# Patient Record
Sex: Male | Born: 1940 | Race: White | Hispanic: No | Smoking: Former smoker
Health system: Southern US, Community
[De-identification: ages and names within clinical notes are randomized; demographics above are authoritative.]

## PROBLEM LIST (undated history)

## (undated) DIAGNOSIS — Z789 Other specified health status: Secondary | ICD-10-CM

## (undated) DIAGNOSIS — J189 Pneumonia, unspecified organism: Secondary | ICD-10-CM

## (undated) DIAGNOSIS — D696 Thrombocytopenia, unspecified: Secondary | ICD-10-CM

## (undated) DIAGNOSIS — M1990 Unspecified osteoarthritis, unspecified site: Secondary | ICD-10-CM

## (undated) DIAGNOSIS — R7303 Prediabetes: Secondary | ICD-10-CM

## (undated) DIAGNOSIS — E785 Hyperlipidemia, unspecified: Secondary | ICD-10-CM

## (undated) DIAGNOSIS — F419 Anxiety disorder, unspecified: Secondary | ICD-10-CM

## (undated) DIAGNOSIS — T7840XA Allergy, unspecified, initial encounter: Secondary | ICD-10-CM

## (undated) DIAGNOSIS — H269 Unspecified cataract: Secondary | ICD-10-CM

## (undated) DIAGNOSIS — I10 Essential (primary) hypertension: Secondary | ICD-10-CM

## (undated) DIAGNOSIS — R972 Elevated prostate specific antigen [PSA]: Secondary | ICD-10-CM

## (undated) DIAGNOSIS — N400 Enlarged prostate without lower urinary tract symptoms: Secondary | ICD-10-CM

## (undated) DIAGNOSIS — K219 Gastro-esophageal reflux disease without esophagitis: Secondary | ICD-10-CM

## (undated) DIAGNOSIS — G4700 Insomnia, unspecified: Secondary | ICD-10-CM

## (undated) HISTORY — DX: Insomnia, unspecified: G47.00

## (undated) HISTORY — DX: Essential (primary) hypertension: I10

## (undated) HISTORY — DX: Anxiety disorder, unspecified: F41.9

## (undated) HISTORY — DX: Benign prostatic hyperplasia without lower urinary tract symptoms: N40.0

## (undated) HISTORY — PX: TONSILLECTOMY: SUR1361

## (undated) HISTORY — DX: Gastro-esophageal reflux disease without esophagitis: K21.9

## (undated) HISTORY — PX: OTHER SURGICAL HISTORY: SHX169

## (undated) HISTORY — DX: Unspecified cataract: H26.9

## (undated) HISTORY — PX: CARDIAC VALVE REPLACEMENT: SHX585

## (undated) HISTORY — DX: Hyperlipidemia, unspecified: E78.5

## (undated) HISTORY — PX: JOINT REPLACEMENT: SHX530

## (undated) HISTORY — DX: Allergy, unspecified, initial encounter: T78.40XA

## (undated) HISTORY — DX: Elevated prostate specific antigen (PSA): R97.20

## (undated) HISTORY — PX: REFRACTIVE SURGERY: SHX103

## (undated) HISTORY — DX: Thrombocytopenia, unspecified: D69.6

---

## 1985-02-09 HISTORY — PX: LIPOSUCTION: SHX10

## 2003-12-17 ENCOUNTER — Ambulatory Visit: Payer: Self-pay | Admitting: Family Medicine

## 2004-06-24 ENCOUNTER — Ambulatory Visit: Payer: Self-pay | Admitting: Family Medicine

## 2005-02-20 ENCOUNTER — Ambulatory Visit: Payer: Self-pay | Admitting: Family Medicine

## 2005-03-02 ENCOUNTER — Encounter: Payer: Self-pay | Admitting: Family Medicine

## 2005-04-07 ENCOUNTER — Ambulatory Visit: Payer: Self-pay | Admitting: Family Medicine

## 2006-02-10 ENCOUNTER — Ambulatory Visit: Payer: Self-pay | Admitting: Family Medicine

## 2006-02-10 LAB — CONVERTED CEMR LAB
ALT: 41 units/L — ABNORMAL HIGH (ref 0–40)
AST: 26 units/L (ref 0–37)
Albumin: 3.8 g/dL (ref 3.5–5.2)
Alkaline Phosphatase: 68 units/L (ref 39–117)
BUN: 14 mg/dL (ref 6–23)
Basophils Absolute: 0 10*3/uL (ref 0.0–0.1)
Basophils Relative: 0.3 % (ref 0.0–1.0)
CO2: 29 meq/L (ref 19–32)
Calcium: 9 mg/dL (ref 8.4–10.5)
Chloride: 104 meq/L (ref 96–112)
Chol/HDL Ratio, serum: 4.1
Cholesterol: 206 mg/dL (ref 0–200)
Creatinine, Ser: 1 mg/dL (ref 0.4–1.5)
Eosinophil percent: 2.4 % (ref 0.0–5.0)
GFR calc non Af Amer: 80 mL/min
Glomerular Filtration Rate, Af Am: 97 mL/min/{1.73_m2}
Glucose, Bld: 113 mg/dL — ABNORMAL HIGH (ref 70–99)
HCT: 45.1 % (ref 39.0–52.0)
HDL: 50.3 mg/dL (ref >39.0)
Hemoglobin: 14.6 g/dL (ref 13.0–17.0)
Hgb A1c MFr Bld: 5.3 % (ref 4.6–6.0)
LDL DIRECT: 110.4 mg/dL
Lymphocytes Relative: 30.3 % (ref 12.0–46.0)
MCHC: 32.4 g/dL (ref 30.0–36.0)
MCV: 91 fL (ref 78.0–100.0)
Monocytes Absolute: 0.5 10*3/uL (ref 0.2–0.7)
Monocytes Relative: 9.8 % (ref 3.0–11.0)
Neutro Abs: 2.7 10*3/uL (ref 1.4–7.7)
Neutrophils Relative %: 57.2 % (ref 43.0–77.0)
PSA: 5.94 ng/mL — ABNORMAL HIGH (ref 0.10–4.00)
Platelets: 129 10*3/uL — ABNORMAL LOW (ref 150–400)
Potassium: 3.6 meq/L (ref 3.5–5.1)
RBC: 4.96 M/uL (ref 4.22–5.81)
RDW: 12.9 % (ref 11.5–14.6)
Sodium: 139 meq/L (ref 135–145)
TSH: 4.62 microintl units/mL (ref 0.35–5.50)
Total Bilirubin: 1.1 mg/dL (ref 0.3–1.2)
Total Protein: 6.3 g/dL (ref 6.0–8.3)
Triglyceride fasting, serum: 193 mg/dL — ABNORMAL HIGH (ref 0–149)
VLDL: 39 mg/dL (ref 0–40)
WBC: 4.8 10*3/uL (ref 4.5–10.5)

## 2006-02-22 ENCOUNTER — Ambulatory Visit: Payer: Self-pay | Admitting: Family Medicine

## 2006-03-29 ENCOUNTER — Ambulatory Visit: Payer: Self-pay | Admitting: Family Medicine

## 2006-10-12 DIAGNOSIS — E785 Hyperlipidemia, unspecified: Secondary | ICD-10-CM | POA: Insufficient documentation

## 2006-10-18 ENCOUNTER — Telehealth: Payer: Self-pay | Admitting: Family Medicine

## 2007-01-25 ENCOUNTER — Ambulatory Visit: Payer: Self-pay | Admitting: Family Medicine

## 2007-01-25 LAB — CONVERTED CEMR LAB
Bilirubin Urine: NEGATIVE
Blood in Urine, dipstick: NEGATIVE
Glucose, Urine, Semiquant: NEGATIVE
Ketones, urine, test strip: NEGATIVE
Nitrite: NEGATIVE
Specific Gravity, Urine: 1.02
pH: 5.5

## 2007-01-26 DIAGNOSIS — R972 Elevated prostate specific antigen [PSA]: Secondary | ICD-10-CM | POA: Insufficient documentation

## 2007-01-26 LAB — CONVERTED CEMR LAB
ALT: 47 units/L (ref 0–53)
Alkaline Phosphatase: 68 units/L (ref 39–117)
BUN: 12 mg/dL (ref 6–23)
Basophils Relative: 0.1 % (ref 0.0–1.0)
Bilirubin, Direct: 0.2 mg/dL (ref 0.0–0.3)
CO2: 25 meq/L (ref 19–32)
Calcium: 9.3 mg/dL (ref 8.4–10.5)
Cholesterol: 194 mg/dL (ref 0–200)
Direct LDL: 118.2 mg/dL
Eosinophils Absolute: 0.1 10*3/uL (ref 0.0–0.6)
GFR calc Af Amer: 96 mL/min
GFR calc non Af Amer: 80 mL/min
HDL: 42 mg/dL (ref >39.0)
Hemoglobin: 15.4 g/dL (ref 13.0–17.0)
Lymphocytes Relative: 36.2 % (ref 12.0–46.0)
MCHC: 35.4 g/dL (ref 30.0–36.0)
MCV: 92.1 fL (ref 78.0–100.0)
Monocytes Absolute: 0.5 10*3/uL (ref 0.2–0.7)
Monocytes Relative: 11.6 % — ABNORMAL HIGH (ref 3.0–11.0)
Neutro Abs: 2.3 10*3/uL (ref 1.4–7.7)
Platelets: 130 10*3/uL — ABNORMAL LOW (ref 150–400)
Potassium: 4.2 meq/L (ref 3.5–5.1)
TSH: 4.21 microintl units/mL (ref 0.35–5.50)
Total Protein: 6.5 g/dL (ref 6.0–8.3)
Triglycerides: 227 mg/dL (ref 0–149)

## 2007-02-09 ENCOUNTER — Encounter: Payer: Self-pay | Admitting: Family Medicine

## 2007-02-16 ENCOUNTER — Ambulatory Visit: Payer: Self-pay | Admitting: Family Medicine

## 2007-02-16 DIAGNOSIS — N400 Enlarged prostate without lower urinary tract symptoms: Secondary | ICD-10-CM

## 2007-02-16 DIAGNOSIS — F411 Generalized anxiety disorder: Secondary | ICD-10-CM | POA: Insufficient documentation

## 2007-02-16 DIAGNOSIS — N138 Other obstructive and reflux uropathy: Secondary | ICD-10-CM | POA: Insufficient documentation

## 2007-08-15 ENCOUNTER — Ambulatory Visit: Payer: Self-pay | Admitting: Family Medicine

## 2007-08-15 DIAGNOSIS — K219 Gastro-esophageal reflux disease without esophagitis: Secondary | ICD-10-CM | POA: Insufficient documentation

## 2007-09-19 ENCOUNTER — Telehealth: Payer: Self-pay | Admitting: Family Medicine

## 2008-01-31 ENCOUNTER — Ambulatory Visit: Payer: Self-pay | Admitting: Family Medicine

## 2008-01-31 LAB — CONVERTED CEMR LAB
Glucose, Urine, Semiquant: NEGATIVE
Ketones, urine, test strip: NEGATIVE
Nitrite: NEGATIVE
Specific Gravity, Urine: 1.02
WBC Urine, dipstick: NEGATIVE
pH: 6

## 2008-02-01 LAB — CONVERTED CEMR LAB
Albumin: 3.8 g/dL (ref 3.5–5.2)
Alkaline Phosphatase: 60 units/L (ref 39–117)
BUN: 12 mg/dL (ref 6–23)
Basophils Relative: 0.5 % (ref 0.0–3.0)
Calcium: 9 mg/dL (ref 8.4–10.5)
Creatinine, Ser: 1 mg/dL (ref 0.4–1.5)
Eosinophils Relative: 2.3 % (ref 0.0–5.0)
GFR calc Af Amer: 96 mL/min
Glucose, Bld: 105 mg/dL — ABNORMAL HIGH (ref 70–99)
HCT: 42.5 % (ref 39.0–52.0)
Hemoglobin: 14.7 g/dL (ref 13.0–17.0)
MCV: 91.1 fL (ref 78.0–100.0)
Monocytes Absolute: 0.3 10*3/uL (ref 0.1–1.0)
Monocytes Relative: 8.3 % (ref 3.0–12.0)
Neutro Abs: 2.3 10*3/uL (ref 1.4–7.7)
PSA: 9.46 ng/mL — ABNORMAL HIGH (ref 0.10–4.00)
Potassium: 4.4 meq/L (ref 3.5–5.1)
RBC: 4.67 M/uL (ref 4.22–5.81)
Total CHOL/HDL Ratio: 3.9
Total Protein: 6.3 g/dL (ref 6.0–8.3)
WBC: 3.9 10*3/uL — ABNORMAL LOW (ref 4.5–10.5)

## 2008-02-07 ENCOUNTER — Ambulatory Visit: Payer: Self-pay | Admitting: Family Medicine

## 2008-02-21 ENCOUNTER — Encounter: Payer: Self-pay | Admitting: Family Medicine

## 2008-02-22 ENCOUNTER — Telehealth: Payer: Self-pay | Admitting: Family Medicine

## 2008-02-24 ENCOUNTER — Telehealth: Payer: Self-pay | Admitting: Family Medicine

## 2008-04-24 ENCOUNTER — Ambulatory Visit: Payer: Self-pay | Admitting: Family Medicine

## 2008-05-28 ENCOUNTER — Encounter: Payer: Self-pay | Admitting: Family Medicine

## 2008-06-08 ENCOUNTER — Encounter: Payer: Self-pay | Admitting: Family Medicine

## 2008-07-10 ENCOUNTER — Telehealth: Payer: Self-pay | Admitting: Family Medicine

## 2008-07-11 ENCOUNTER — Telehealth: Payer: Self-pay | Admitting: Family Medicine

## 2008-08-17 ENCOUNTER — Ambulatory Visit: Payer: Self-pay | Admitting: Internal Medicine

## 2008-08-17 DIAGNOSIS — R5383 Other fatigue: Secondary | ICD-10-CM

## 2008-08-17 DIAGNOSIS — R5381 Other malaise: Secondary | ICD-10-CM | POA: Insufficient documentation

## 2008-08-17 LAB — CONVERTED CEMR LAB
Glucose, Urine, Semiquant: NEGATIVE
Protein, U semiquant: NEGATIVE
WBC Urine, dipstick: NEGATIVE
pH: 7

## 2008-08-20 LAB — CONVERTED CEMR LAB
Albumin: 4.2 g/dL (ref 3.5–5.2)
Basophils Absolute: 0 10*3/uL (ref 0.0–0.1)
Bilirubin, Direct: 0.2 mg/dL (ref 0.0–0.3)
CO2: 27 meq/L (ref 19–32)
Calcium: 9.5 mg/dL (ref 8.4–10.5)
Creatinine, Ser: 1.1 mg/dL (ref 0.4–1.5)
Glucose, Bld: 102 mg/dL — ABNORMAL HIGH (ref 70–99)
HCT: 43.8 % (ref 39.0–52.0)
HDL: 40.7 mg/dL (ref >39.00)
Lymphs Abs: 1.2 10*3/uL (ref 0.7–4.0)
MCHC: 35.1 g/dL (ref 30.0–36.0)
MCV: 91.8 fL (ref 78.0–100.0)
Monocytes Absolute: 0.4 10*3/uL (ref 0.1–1.0)
Platelets: 119 10*3/uL — ABNORMAL LOW (ref 150.0–400.0)
RDW: 12.6 % (ref 11.5–14.6)
Total CK: 124 units/L (ref 7–232)
Total Protein: 6.9 g/dL (ref 6.0–8.3)
Triglycerides: 129 mg/dL (ref 0.0–149.0)

## 2008-09-03 ENCOUNTER — Ambulatory Visit: Payer: Self-pay | Admitting: Family Medicine

## 2008-09-03 DIAGNOSIS — K589 Irritable bowel syndrome without diarrhea: Secondary | ICD-10-CM | POA: Insufficient documentation

## 2009-01-11 ENCOUNTER — Telehealth: Payer: Self-pay | Admitting: Family Medicine

## 2009-02-11 ENCOUNTER — Telehealth: Payer: Self-pay | Admitting: Family Medicine

## 2009-02-18 ENCOUNTER — Telehealth: Payer: Self-pay | Admitting: Family Medicine

## 2009-02-27 ENCOUNTER — Ambulatory Visit: Payer: Self-pay | Admitting: Family Medicine

## 2009-02-27 LAB — CONVERTED CEMR LAB
Blood in Urine, dipstick: NEGATIVE
Glucose, Urine, Semiquant: NEGATIVE
Nitrite: NEGATIVE
Protein, U semiquant: NEGATIVE
WBC Urine, dipstick: NEGATIVE
pH: 5.5

## 2009-03-01 LAB — CONVERTED CEMR LAB
AST: 24 units/L (ref 0–37)
Alkaline Phosphatase: 79 units/L (ref 39–117)
Basophils Absolute: 0 10*3/uL (ref 0.0–0.1)
Bilirubin, Direct: 0.1 mg/dL (ref 0.0–0.3)
Calcium: 9.6 mg/dL (ref 8.4–10.5)
Creatinine, Ser: 1.1 mg/dL (ref 0.4–1.5)
GFR calc non Af Amer: 70.75 mL/min (ref >60)
Glucose, Bld: 112 mg/dL — ABNORMAL HIGH (ref 70–99)
HDL: 37.7 mg/dL — ABNORMAL LOW (ref >39.00)
Hemoglobin: 14.9 g/dL (ref 13.0–17.0)
Lymphocytes Relative: 33.3 % (ref 12.0–46.0)
Monocytes Relative: 9.3 % (ref 3.0–12.0)
Neutro Abs: 2.7 10*3/uL (ref 1.4–7.7)
Neutrophils Relative %: 54.5 % (ref 43.0–77.0)
Platelets: 142 10*3/uL — ABNORMAL LOW (ref 150.0–400.0)
RDW: 12.7 % (ref 11.5–14.6)
Sodium: 140 meq/L (ref 135–145)
Total Bilirubin: 0.9 mg/dL (ref 0.3–1.2)
VLDL: 31 mg/dL (ref 0.0–40.0)

## 2009-03-04 ENCOUNTER — Ambulatory Visit: Payer: Self-pay | Admitting: Family Medicine

## 2009-05-20 ENCOUNTER — Encounter (INDEPENDENT_AMBULATORY_CARE_PROVIDER_SITE_OTHER): Payer: Self-pay | Admitting: *Deleted

## 2009-06-24 ENCOUNTER — Ambulatory Visit: Payer: Self-pay | Admitting: Family Medicine

## 2009-06-24 DIAGNOSIS — N529 Male erectile dysfunction, unspecified: Secondary | ICD-10-CM | POA: Insufficient documentation

## 2009-06-24 DIAGNOSIS — J209 Acute bronchitis, unspecified: Secondary | ICD-10-CM | POA: Insufficient documentation

## 2009-08-13 ENCOUNTER — Telehealth: Payer: Self-pay | Admitting: Family Medicine

## 2009-08-16 ENCOUNTER — Encounter: Payer: Self-pay | Admitting: Family Medicine

## 2009-08-16 DIAGNOSIS — K429 Umbilical hernia without obstruction or gangrene: Secondary | ICD-10-CM | POA: Insufficient documentation

## 2009-09-23 ENCOUNTER — Telehealth: Payer: Self-pay | Admitting: Family Medicine

## 2009-09-23 DIAGNOSIS — M25569 Pain in unspecified knee: Secondary | ICD-10-CM | POA: Insufficient documentation

## 2009-10-07 ENCOUNTER — Encounter: Payer: Self-pay | Admitting: Family Medicine

## 2009-10-11 ENCOUNTER — Encounter: Payer: Self-pay | Admitting: Family Medicine

## 2010-01-07 ENCOUNTER — Telehealth (INDEPENDENT_AMBULATORY_CARE_PROVIDER_SITE_OTHER): Payer: Self-pay | Admitting: *Deleted

## 2010-01-08 ENCOUNTER — Ambulatory Visit: Payer: Self-pay | Admitting: Family Medicine

## 2010-01-08 LAB — CONVERTED CEMR LAB
Bilirubin Urine: NEGATIVE
Glucose, Urine, Semiquant: NEGATIVE
Ketones, urine, test strip: NEGATIVE
Protein, U semiquant: NEGATIVE
Urobilinogen, UA: 0.2
pH: 5

## 2010-01-09 ENCOUNTER — Encounter: Payer: Self-pay | Admitting: Family Medicine

## 2010-01-10 ENCOUNTER — Ambulatory Visit (HOSPITAL_COMMUNITY)
Admission: RE | Admit: 2010-01-10 | Discharge: 2010-01-10 | Payer: Self-pay | Source: Home / Self Care | Admitting: Surgery

## 2010-02-11 ENCOUNTER — Encounter (INDEPENDENT_AMBULATORY_CARE_PROVIDER_SITE_OTHER): Payer: Self-pay | Admitting: *Deleted

## 2010-02-14 ENCOUNTER — Encounter (INDEPENDENT_AMBULATORY_CARE_PROVIDER_SITE_OTHER): Payer: Self-pay | Admitting: *Deleted

## 2010-02-17 ENCOUNTER — Ambulatory Visit
Admission: RE | Admit: 2010-02-17 | Discharge: 2010-02-17 | Payer: Self-pay | Source: Home / Self Care | Attending: Internal Medicine | Admitting: Internal Medicine

## 2010-02-26 ENCOUNTER — Ambulatory Visit
Admission: RE | Admit: 2010-02-26 | Discharge: 2010-02-26 | Payer: Self-pay | Source: Home / Self Care | Attending: Internal Medicine | Admitting: Internal Medicine

## 2010-02-26 ENCOUNTER — Other Ambulatory Visit: Payer: Self-pay | Admitting: Internal Medicine

## 2010-02-26 DIAGNOSIS — Z860100 Personal history of colon polyps, unspecified: Secondary | ICD-10-CM | POA: Insufficient documentation

## 2010-02-28 ENCOUNTER — Other Ambulatory Visit: Payer: Self-pay | Admitting: Family Medicine

## 2010-02-28 ENCOUNTER — Ambulatory Visit
Admission: RE | Admit: 2010-02-28 | Discharge: 2010-02-28 | Payer: Self-pay | Source: Home / Self Care | Attending: Family Medicine | Admitting: Family Medicine

## 2010-02-28 LAB — HEPATIC FUNCTION PANEL
ALT: 35 U/L (ref 0–53)
AST: 25 U/L (ref 0–37)
Albumin: 4.1 g/dL (ref 3.5–5.2)
Alkaline Phosphatase: 70 U/L (ref 39–117)
Bilirubin, Direct: 0.2 mg/dL (ref 0.0–0.3)
Total Bilirubin: 0.8 mg/dL (ref 0.3–1.2)
Total Protein: 6.5 g/dL (ref 6.0–8.3)

## 2010-02-28 LAB — CBC WITH DIFFERENTIAL/PLATELET
Basophils Absolute: 0 10*3/uL (ref 0.0–0.1)
Basophils Relative: 0.4 % (ref 0.0–3.0)
Eosinophils Absolute: 0.2 10*3/uL (ref 0.0–0.7)
Eosinophils Relative: 2.8 % (ref 0.0–5.0)
HCT: 43.6 % (ref 39.0–52.0)
Hemoglobin: 15.2 g/dL (ref 13.0–17.0)
Lymphocytes Relative: 30.7 % (ref 12.0–46.0)
Lymphs Abs: 1.7 10*3/uL (ref 0.7–4.0)
MCHC: 34.8 g/dL (ref 30.0–36.0)
MCV: 91.6 fl (ref 78.0–100.0)
Monocytes Absolute: 0.5 10*3/uL (ref 0.1–1.0)
Monocytes Relative: 8.6 % (ref 3.0–12.0)
Neutro Abs: 3.2 10*3/uL (ref 1.4–7.7)
Neutrophils Relative %: 57.5 % (ref 43.0–77.0)
Platelets: 118 10*3/uL — ABNORMAL LOW (ref 150.0–400.0)
RBC: 4.76 Mil/uL (ref 4.22–5.81)
RDW: 13.8 % (ref 11.5–14.6)
WBC: 5.6 10*3/uL (ref 4.5–10.5)

## 2010-02-28 LAB — CONVERTED CEMR LAB
Blood in Urine, dipstick: NEGATIVE
Nitrite: NEGATIVE
Specific Gravity, Urine: 1.02
Urobilinogen, UA: 0.2
WBC Urine, dipstick: NEGATIVE

## 2010-02-28 LAB — LIPID PANEL
Cholesterol: 180 mg/dL (ref 0–200)
HDL: 48.3 mg/dL (ref >39.00)
LDL Cholesterol: 108 mg/dL — ABNORMAL HIGH (ref 0–99)
Total CHOL/HDL Ratio: 4
Triglycerides: 120 mg/dL (ref 0.0–149.0)
VLDL: 24 mg/dL (ref 0.0–40.0)

## 2010-02-28 LAB — BASIC METABOLIC PANEL
BUN: 14 mg/dL (ref 6–23)
CO2: 27 mEq/L (ref 19–32)
Calcium: 9.3 mg/dL (ref 8.4–10.5)
Chloride: 109 mEq/L (ref 96–112)
Creatinine, Ser: 0.9 mg/dL (ref 0.4–1.5)
GFR: 93.71 mL/min (ref >60.00)
Glucose, Bld: 107 mg/dL — ABNORMAL HIGH (ref 70–99)
Potassium: 4.6 mEq/L (ref 3.5–5.1)
Sodium: 144 mEq/L (ref 135–145)

## 2010-02-28 LAB — PSA: PSA: 8.59 ng/mL — ABNORMAL HIGH (ref 0.10–4.00)

## 2010-02-28 LAB — TSH: TSH: 4.24 u[IU]/mL (ref 0.35–5.50)

## 2010-03-03 ENCOUNTER — Encounter: Payer: Self-pay | Admitting: Family Medicine

## 2010-03-03 ENCOUNTER — Encounter: Payer: Self-pay | Admitting: Internal Medicine

## 2010-03-07 ENCOUNTER — Ambulatory Visit
Admission: RE | Admit: 2010-03-07 | Discharge: 2010-03-07 | Payer: Self-pay | Source: Home / Self Care | Attending: Family Medicine | Admitting: Family Medicine

## 2010-03-07 ENCOUNTER — Other Ambulatory Visit: Payer: Self-pay | Admitting: Family Medicine

## 2010-03-07 ENCOUNTER — Encounter: Payer: Self-pay | Admitting: Family Medicine

## 2010-03-07 LAB — TESTOSTERONE: Testosterone: 1007.06 ng/dL — ABNORMAL HIGH (ref 350.00–890.00)

## 2010-03-11 NOTE — Progress Notes (Signed)
Summary: sinus & throat  Phone Note Call from Patient Call back at Home Phone [phone]   Caller: vm Call For: fry Summary of Call: Requesting Zpak for sinus & throat issue, usually works to [employer]  Get this on biyearly basis. Initial call taken by: Rudy Jew, RN,  February 18, 2009 11:31 AM  Follow-up for Phone Call        call in a [employer]pack Follow-up by: Nelwyn Salisbury MD,  February 18, 2009 4:12 PM  Additional Follow-up for Phone Call Additional follow up Details #1::        Phone Call Completed, Rx Called In Additional Follow-up by: Alfred Levins, CMA,  February 19, 2009 10:48 AM    New/Updated Medications: ZITHROMAX Z-PAK 250 MG  TABS (AZITHROMYCIN) Use as directed Prescriptions: ZITHROMAX Z-PAK 250 MG  TABS (AZITHROMYCIN) Use as directed  #1 x 0   Entered by:   Alfred Levins, CMA   Authorized by:   Nelwyn Salisbury MD   Signed by:   Alfred Levins, CMA on 02/19/2009   Method used:   Electronically to        [facility]  [phone]* (retail)       [address] [place], [place]  [address]       Ph: [phone] or [phone]       Fax: [phone]   RxID:   [id number]

## 2010-03-11 NOTE — Progress Notes (Signed)
Summary: HEMAURIA   Phone Note Call from Patient Call back at Home Phone [phone]   Caller: Patient Call For: Nelwyn Salisbury MD Summary of Call: Pt has been passing blood through urine with no associated pain.   Initial call taken by: Lynann Beaver CMA AAMA,  January 07, 2010 2:20 PM  Follow-up for Phone Call        Appt scheduled tomorrow. Follow-up by: Lynann Beaver CMA AAMA,  January 07, 2010 2:21 PM

## 2010-03-11 NOTE — Progress Notes (Signed)
Summary: refill ambien  Phone Note From Pharmacy   [facility]  [address]  [phone]* Call For: Sylvan Lahm  Summary of Call: refill zolpidem 10mg  1 by mouth at bedtime Initial call taken by: Alfred Levins, CMA,  February 11, 2009 2:26 PM  Follow-up for Phone Call        call in #30 with 5 rf Follow-up by: Nelwyn Salisbury MD,  February 12, 2009 8:33 AM  Additional Follow-up for Phone Call Additional follow up Details #1::        Phone call completed, Pharmacist called, 90 day rx Additional Follow-up by: Alfred Levins, CMA,  February 12, 2009 9:14 AM    Prescriptions: AMBIEN 10 MG TABS (ZOLPIDEM TARTRATE) 2 by mouth at bedtime  #180 x 1   Entered by:   Alfred Levins, CMA   Authorized by:   Nelwyn Salisbury MD   Signed by:   Alfred Levins, CMA on 02/12/2009   Method used:   Telephoned to ...       [facility]  [phone]* (retail)       [address] [place], [place]  [address]       Ph: [phone] or [phone]       Fax: [phone]   RxID:   [id number]

## 2010-03-11 NOTE — Assessment & Plan Note (Signed)
Summary: cpx/cjr   Vital Signs:  Patient profile:   70 year old male Height:      71 inches Weight:      230 pounds BMI:     32.19 Temp:     97.8 degrees F oral Pulse rate:   62 / minute BP sitting:   138 / 82  (left arm) Cuff size:   large  Vitals Entered By: Alfred Levins, CMA (March 04, 2009 2:58 PM) CC: cpx   History of Present Illness: 70 yr old male for cpx. He spent a great deal of time telling me that he has stopped drinking alcohol completely as of about 6 months ago. He describes himself as a former "maintenance drinker" but not an alcoholic. he felt it was bad for his health, however, so he stopped. He has even attended somje AA meetings for educational pruposes. He thinks that his daily drinks of scotch were a way of treating his anxiety, and I agree with him. His Xanax is doing a better job of this with fewer side effects. He has had an umbilical hernia for years but it is getting bigger and starting to give him pain, so he would like to have this fixed. He has been having alterations of BMs between aloose stools and constipation, he gets a lot of gas and bloating. No pain or fever or nasuea. He tried Librarian, academic but it seemed to make him worse. He has never had a colonoscopy but is now agreeing to have one.   Current Medications (verified): 1)  Lipitor 20 Mg Tabs (Atorvastatin Calcium) .Marland Kitchen.. 1 By Mouth Once Daily 2)  Ambien 10 Mg Tabs (Zolpidem Tartrate) .... 2 By Mouth At Bedtime 3)  Omeprazole 40 Mg  Cpdr (Omeprazole) .... Once Daily 4)  Alprazolam 1 Mg Tabs (Alprazolam) .... Three Times A Day As Needed Anxiety  Allergies (verified): No Known Drug Allergies  Past History:  Past Medical History: Reviewed history from 02/07/2008 and no changes required. Hyperlipidemia Benign prostatic hypertrophy with elevated PSA, sees Dr. Retta Diones Anxiety insomnia  Past Surgical History: prostate biopsies in 2003 and again 05-2008 per Dr. Retta Diones, benign  Social  History: Reviewed history from 08/17/2008 and no changes required. Married Former Smoker previous alcohol use, quit 2010 Drug use-no wife  with dx this year of parkinsons  and under care  hard time working   Review of Systems  The patient denies anorexia, fever, weight loss, weight gain, vision loss, decreased hearing, hoarseness, chest pain, syncope, dyspnea on exertion, peripheral edema, prolonged cough, headaches, hemoptysis, abdominal pain, melena, hematochezia, severe indigestion/heartburn, hematuria, incontinence, genital sores, muscle weakness, suspicious skin lesions, transient blindness, difficulty walking, depression, unusual weight change, abnormal bleeding, enlarged lymph nodes, angioedema, breast masses, and testicular masses.    Physical Exam  General:  overweight-appearing.   Head:  Normocephalic and atraumatic without obvious abnormalities. No apparent alopecia or balding. Eyes:  No corneal or conjunctival inflammation noted. EOMI. Perrla. Funduscopic exam benign, without hemorrhages, exudates or papilledema. Vision grossly normal. Ears:  External ear exam shows no significant lesions or deformities.  Otoscopic examination reveals clear canals, tympanic membranes are intact bilaterally without bulging, retraction, inflammation or discharge. Hearing is grossly normal bilaterally. Nose:  External nasal examination shows no deformity or inflammation. Nasal mucosa are pink and moist without lesions or exudates. Mouth:  Oral mucosa and oropharynx without lesions or exudates.  Teeth in good repair. Neck:  No deformities, masses, or tenderness noted. Chest Wall:  No deformities, masses, tenderness or  gynecomastia noted. Lungs:  Normal respiratory effort, chest expands symmetrically. Lungs are clear to auscultation, no crackles or wheezes. Heart:  Normal rate and regular rhythm. S1 and S2 normal without gallop, murmur, click, rub or other extra sounds. 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 Msk:  No deformity or scoliosis noted of thoracic or lumbar spine.   Pulses:  R and L carotid,radial,femoral,dorsalis pedis and posterior tibial pulses are full and equal bilaterally Extremities:  No clubbing, cyanosis, edema, or deformity noted with normal full range of motion of all joints.   Neurologic:  No cranial nerve deficits noted. Station and gait are normal. Plantar reflexes are down-going bilaterally. DTRs are symmetrical throughout. Sensory, motor and coordinative functions appear intact. Skin:  Intact without suspicious lesions or rashes Cervical Nodes:  No lymphadenopathy noted Axillary Nodes:  No palpable lymphadenopathy Inguinal Nodes:  No significant adenopathy Psych:  Cognition and judgment appear intact. Alert and cooperative with normal attention span and concentration. No apparent delusions, illusions, hallucinations   Impression & Recommendations:  Problem # 1:  WELL ADULT EXAM (ICD-V70.0)  Orders: EKG w/ Interpretation (93000) Gastroenterology Referral (GI)  Complete Medication List: 1)  Lipitor 20 Mg Tabs (Atorvastatin calcium) .Marland Kitchen.. 1 by mouth once daily 2)  Ambien 10 Mg Tabs (Zolpidem tartrate) .... 2 by mouth at bedtime 3)  Omeprazole 40 Mg Cpdr (Omeprazole) .... Once daily 4)  Alprazolam 1 Mg Tabs (Alprazolam) .... Three times a day as needed anxiety 5)  Zithromax Z-pak 250 Mg Tabs (Azithromycin) .... As directed  Other Orders: Flu Vaccine 24yrs + [phone]) Administration Flu vaccine - MCR (V7846) Pneumococcal Vaccine (96295) Admin 1st Vaccine (28413) Admin of Any Addtl Vaccine (24401) Surgical Referral (Surgery) Flu Vaccine Consent Questions     Do you have a history of severe allergic reactions to this vaccine? no    Any prior history of allergic reactions to egg and/or gelatin? no    Do you have a sensitivity to the preservative Thimersol?  no    Do you have a past history of Guillan-Barre Syndrome? no    Do you currently have an acute febrile illness? no    Have you ever had a severe reaction to latex? no    Vaccine information given and explained to patient? yes    Are you currently pregnant? no    Lot Number:[id number]   Exp Date:08/08/2009   Site Given  Left Deltoid IMation Flu vaccine - MCR (U2725)  Patient Instructions: 1)  It is important that you exercise reguarly at least 20 minutes 5 times a week. If you develop chest pain, have severe difficulty breathing, or feel very tired, stop exercising immediately and seek medical attention.  2)  You need to lose weight. Consider a lower calorie diet and regular exercise.  3)  Schedule a colonoscopy/ sigmoidoscopy to help detect colon cancer.  4)  Refer to Surgery about the hernia.  5)  Try Miralax daily for IBS.  Prescriptions: LIPITOR 20 MG TABS (ATORVASTATIN CALCIUM) 1 by mouth once daily  #90 x 3   Entered and Authorized by:   Nelwyn Salisbury MD   Signed by:   Nelwyn Salisbury MD on 03/04/2009   Method used:   Print then Give to Patient   RxID:   [id number] OMEPRAZOLE 40 MG  CPDR (OMEPRAZOLE) once daily  #90 x 3   Entered and Authorized by:   Tera Mater  Clent Ridges MD   Signed by:   Nelwyn Salisbury MD on 03/04/2009   Method used:   Print then Give to Patient   RxID:   [id number] KGMWNUUVO Z-PAK 250 MG TABS (AZITHROMYCIN) as directed  #1 x 0   Entered and Authorized by:   Nelwyn Salisbury MD   Signed by:   Nelwyn Salisbury MD on 03/04/2009   Method used:   Print then Give to Patient   RxID:   [id number]    .lbmedflu    Immunizations Administered:  Pneumonia Vaccine:    Vaccine Type: Pneumovax (Medicare)    Site: right deltoid    Mfr: Merck    Dose: 0.5 ml    Route: IM    Given by: Alfred Levins, CMA    Exp. Date: 03/07/2010    Lot #: [id number]

## 2010-03-11 NOTE — Letter (Signed)
Summary: Marcine Matar MD  Marcine Matar MD   Imported By: Sherian Rein 07/20/2009 11:21:21  _____________________________________________________________________  External Attachment:    Type:   Image     Comment:   External Document

## 2010-03-11 NOTE — Letter (Signed)
Summary: [facility]  [facility]   Imported By: Sherian Rein 10/16/2009 11:45:51  _____________________________________________________________________  External Attachment:    Type:   Image     Comment:   External Document

## 2010-03-11 NOTE — Miscellaneous (Signed)
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

## 2010-03-11 NOTE — Progress Notes (Signed)
Summary: RX Zolpidem  Phone Note Refill Request Message from:  Fax from Pharmacy on August 13, 2009 3:27 PM  Refills Requested: Medication #1:  AMBIEN 10 MG TABS 2 by mouth at bedtime   Dosage confirmed as above?Dosage Confirmed   Last Refilled: 06/13/2009 Last ov 06/24/09, cpx was 03/04/09 with lab work done was current on med list   Initial call taken by: Kathrynn Speed CMA,  August 13, 2009 3:27 PM    Prescriptions: AMBIEN 10 MG TABS (ZOLPIDEM TARTRATE) 2 by mouth at bedtime  #180 x 1   Entered by:   Kathrynn Speed CMA   Authorized by:   Nelwyn Salisbury MD   Signed by:   Raechel Ache, RN on 08/13/2009   Method used:   Telephoned to ...       [facility]  [phone]* (retail)       [address] [place], [place]  [address]       Ph: [phone] or [phone]       Fax: [phone]   RxID:   [id number]

## 2010-03-11 NOTE — Assessment & Plan Note (Signed)
Summary: CONGESTION // RS   Vital Signs:  Patient profile:   70 year old male Weight:      227 pounds Temp:     97.6 degrees F oral BP sitting:   132 / 78  (left arm) Cuff size:   regular  Vitals Entered By: Raechel Ache, RN (Jun 24, 2009 1:27 PM) CC: C/o congestion & cough.   History of Present Illness: Here for one week of chest congestion, PND, and coughing up green sputum. No fever. Also he needs refils on Xanax. He is still very pleased with how this works for him, and he remains completely off alcohol. He wants to try Viagra also.   Allergies (verified): No Known Drug Allergies  Past History:  Past Medical History: Reviewed history from 02/07/2008 and no changes required. Hyperlipidemia Benign prostatic hypertrophy with elevated PSA, sees Dr. Retta Diones Anxiety insomnia  Review of Systems  The patient denies anorexia, fever, weight loss, weight gain, vision loss, decreased hearing, hoarseness, chest pain, syncope, dyspnea on exertion, peripheral edema, headaches, hemoptysis, abdominal pain, melena, hematochezia, severe indigestion/heartburn, hematuria, incontinence, genital sores, muscle weakness, suspicious skin lesions, transient blindness, difficulty walking, depression, unusual weight change, abnormal bleeding, enlarged lymph nodes, angioedema, breast masses, and testicular masses.    Physical Exam  General:  Well-developed,well-nourished,in no acute distress; alert,appropriate and cooperative throughout examination Head:  Normocephalic and atraumatic without obvious abnormalities. No apparent alopecia or balding. Eyes:  No corneal or conjunctival inflammation noted. EOMI. Perrla. Funduscopic exam benign, without hemorrhages, exudates or papilledema. Vision grossly normal. Ears:  External ear exam shows no significant lesions or deformities.  Otoscopic examination reveals clear canals, tympanic membranes are intact bilaterally without bulging, retraction,  inflammation or discharge. Hearing is grossly normal bilaterally. Nose:  External nasal examination shows no deformity or inflammation. Nasal mucosa are pink and moist without lesions or exudates. Mouth:  Oral mucosa and oropharynx without lesions or exudates.  Teeth in good repair. Neck:  No deformities, masses, or tenderness noted. Lungs:  Normal respiratory effort, chest expands symmetrically. Lungs are clear to auscultation, no crackles or wheezes. Psych:  Cognition and judgment appear intact. Alert and cooperative with normal attention span and concentration. No apparent delusions, illusions, hallucinations   Impression & Recommendations:  Problem # 1:  ACUTE BRONCHITIS (ICD-466.0)  His updated medication list for this problem includes:    Biaxin Xl 500 Mg Xr24h-tab (Clarithromycin) .Marland Kitchen..Marland Kitchen Two times a day    Augmentin 875-125 Mg Tabs (Amoxicillin-pot clavulanate) .Marland Kitchen..Marland Kitchen Two times a day  Problem # 2:  ERECTILE DYSFUNCTION, ORGANIC (ICD-607.84)  His updated medication list for this problem includes:    Viagra 100 Mg Tabs (Sildenafil citrate) .Marland Kitchen... As needed  Problem # 3:  ANXIETY (ICD-300.00)  His updated medication list for this problem includes:    Alprazolam 1 Mg Tabs (Alprazolam) .Marland Kitchen... Four  times a day as needed anxiety  Complete Medication List: 1)  Lipitor 20 Mg Tabs (Atorvastatin calcium) .Marland Kitchen.. 1 by mouth once daily 2)  Ambien 10 Mg Tabs (Zolpidem tartrate) .... 2 by mouth at bedtime 3)  Omeprazole 40 Mg Cpdr (Omeprazole) .... Once daily 4)  Alprazolam 1 Mg Tabs (Alprazolam) .... Four  times a day as needed anxiety 5)  Biaxin Xl 500 Mg Xr24h-tab (Clarithromycin) .... Two times a day 6)  Augmentin 875-125 Mg Tabs (Amoxicillin-pot clavulanate) .... Two times a day 7)  Viagra 100 Mg Tabs (Sildenafil citrate) .... As needed  Patient Instructions: 1)  Please schedule a follow-up appointment as  needed .  Prescriptions: VIAGRA 100 MG TABS (SILDENAFIL CITRATE) as needed  #10 x  11   Entered and Authorized by:   Nelwyn Salisbury MD   Signed by:   Nelwyn Salisbury MD on 06/24/2009   Method used:   Print then Give to Patient   RxID:   [id number] ALPRAZOLAM 1 MG TABS (ALPRAZOLAM) four  times a day as needed anxiety  #120 x 5   Entered and Authorized by:   Nelwyn Salisbury MD   Signed by:   Nelwyn Salisbury MD on 06/24/2009   Method used:   Print then Give to Patient   RxID:   [id number] AUGMENTIN 875-125 MG TABS (AMOXICILLIN-POT CLAVULANATE) two times a day  #20 x 0   Entered and Authorized by:   Nelwyn Salisbury MD   Signed by:   Nelwyn Salisbury MD on 06/24/2009   Method used:   Print then Give to Patient   RxID:   [id number] BIAXIN XL 500 MG XR24H-TAB (CLARITHROMYCIN) two times a day  #20 x 0   Entered and Authorized by:   Nelwyn Salisbury MD   Signed by:   Nelwyn Salisbury MD on 06/24/2009   Method used:   Print then Give to Patient   RxID:   [id number]

## 2010-03-11 NOTE — Consult Note (Signed)
Summary: [facility]   Imported By: Maryln Gottron 11/01/2009 15:11:42  _____________________________________________________________________  External Attachment:    Type:   Image     Comment:   External Document

## 2010-03-11 NOTE — Assessment & Plan Note (Signed)
Summary: hematuria/dm   Vital Signs:  Patient profile:   70 year old male O2 Sat:      97 % Temp:     98.5 degrees F Pulse rate:   68 / minute BP sitting:   140 / 88  (left arm) Cuff size:   regular  Vitals Entered By: Pura Spice, RN (January 08, 2010 8:48 AM) CC: henmauria since yest no pain refill alprazolam  states no blood seen today    History of Present Illness: Here to ask about blood in the urine, which he first noticed yesterday. This am he did not see any. He denies any burning or urgency, no fever, or any other symptoms. He has a ventral hernia, and he is scheduled to have this repaired by Dr. Wenda Low later this week.   Allergies: No Known Drug Allergies  Past History:  Past Medical History: Reviewed history from 02/07/2008 and no changes required. Hyperlipidemia Benign prostatic hypertrophy with elevated PSA, sees Dr. Retta Diones Anxiety insomnia  Past Surgical History: prostate biopsies in 2003 and again 05-2008 per Dr. Retta Diones, benign right knee arthroscopy  Review of Systems  The patient denies anorexia, fever, weight loss, weight gain, vision loss, decreased hearing, hoarseness, chest pain, syncope, dyspnea on exertion, peripheral edema, prolonged cough, headaches, hemoptysis, abdominal pain, melena, hematochezia, severe indigestion/heartburn, hematuria, incontinence, genital sores, muscle weakness, suspicious skin lesions, transient blindness, difficulty walking, depression, unusual weight change, abnormal bleeding, enlarged lymph nodes, angioedema, breast masses, and testicular masses.    Physical Exam  General:  Well-developed,well-nourished,in no acute distress; alert,appropriate and cooperative throughout examination Abdomen:  Bowel sounds positive,abdomen soft and non-tender without masses, organomegaly or hernias noted. Psych:  Cognition and judgment appear intact. Alert and cooperative with normal attention span and concentration. No apparent  delusions, illusions, hallucinations   Impression & Recommendations:  Problem # 1:  HEMATURIA UNSPECIFIED (ICD-599.70)  His updated medication list for this problem includes:    Ciprofloxacin Hcl 500 Mg Tabs (Ciprofloxacin hcl) .Marland Kitchen..Marland Kitchen Two times a day    Augmentin 875-125 Mg Tabs (Amoxicillin-pot clavulanate) .Marland Kitchen..Marland Kitchen Two times a day  Orders: UA Dipstick w/o Micro (manual) ([address]) T-Culture, Urine ([id number])  Problem # 2:  ANXIETY (ICD-300.00)  His updated medication list for this problem includes:    Alprazolam 1 Mg Tabs (Alprazolam) .Marland Kitchen... Four  times a day as needed anxiety  Complete Medication List: 1)  Lipitor 20 Mg Tabs (Atorvastatin calcium) .Marland Kitchen.. 1 by mouth once daily 2)  Ambien 10 Mg Tabs (Zolpidem tartrate) .... 2 by mouth at bedtime 3)  Omeprazole 40 Mg Cpdr (Omeprazole) .... Once daily 4)  Alprazolam 1 Mg Tabs (Alprazolam) .... Four  times a day as needed anxiety 5)  Viagra 100 Mg Tabs (Sildenafil citrate) .... As needed 6)  Ciprofloxacin Hcl 500 Mg Tabs (Ciprofloxacin hcl) .... Two times a day 7)  Augmentin 875-125 Mg Tabs (Amoxicillin-pot clavulanate) .... Two times a day  Other Orders: Gastroenterology Referral (GI)  Patient Instructions: 1)  Cover for infection and send the sample for culture. Refilled Alprazolam. Prescriptions: ALPRAZOLAM 1 MG TABS (ALPRAZOLAM) four  times a day as needed anxiety  #120 x 5   Entered and Authorized by:   Nelwyn Salisbury MD   Signed by:   Nelwyn Salisbury MD on 01/08/2010   Method used:   Print then Give to Patient   RxID:   [id number] AUGMENTIN 875-125 MG TABS (AMOXICILLIN-POT CLAVULANATE) two times a day  #20 x 0   Entered and  Authorized by:   Nelwyn Salisbury MD   Signed by:   Nelwyn Salisbury MD on 01/08/2010   Method used:   Electronically to        [facility]  [phone]* (retail)       [address] [place], [place]  [address]       Ph: [phone] or [phone]       Fax: [phone]   RxID:    [id number] CIPROFLOXACIN HCL 500 MG TABS (CIPROFLOXACIN HCL) two times a day  #20 x 0   Entered and Authorized by:   Nelwyn Salisbury MD   Signed by:   Nelwyn Salisbury MD on 01/08/2010   Method used:   Electronically to        [facility]  [phone]* (retail)       [address] [place], [place]  [address]       Ph: [phone] or [phone]       Fax: [phone]   RxID:   [id number]    Orders Added: 1)  Est. Patient Level IV [[address]] 2)  UA Dipstick w/o Micro (manual) [[address]1002] 3)  T-Culture, Urine [[id number]] 4)  Gastroenterology Referral [GI]    Laboratory Results   Urine Tests  Date/Time Received: January 08, 2010 8:58 AM Date/Time Reported: 8:58 AM   Routine Urinalysis   Color: yellow Appearance: Clear Glucose: negative   (Normal Range: Negative) Bilirubin: negative   (Normal Range: Negative) Ketone: negative   (Normal Range: Negative) Spec. Gravity: 1.020   (Normal Range: 1.003-1.035) Blood: small   (Normal Range: Negative) pH: 5.0   (Normal Range: 5.0-8.0) Protein: negative   (Normal Range: Negative) Urobilinogen: 0.2   (Normal Range: 0-1) Nitrite: negative   (Normal Range: Negative) Leukocyte Esterace: negative   (Normal Range: Negative)    Comments: Pura Spice, RN  January 08, 2010 8:58 AM

## 2010-03-11 NOTE — Progress Notes (Signed)
Summary: need referral  Phone Note Call from Patient Call back at Home Phone [phone]   Caller: Patient Call For: Nelwyn Salisbury MD Summary of Call: pt would like a referral to see dr Dion Saucier ortho [phone] for right knee pain after aug 24,2011 Initial call taken by: Heron Sabins,  September 23, 2009 10:31 AM  Follow-up for Phone Call        please do so Follow-up by: Nelwyn Salisbury MD,  September 23, 2009 3:31 PM  New Problems: KNEE PAIN, RIGHT (ICD-719.46)   New Problems: KNEE PAIN, RIGHT (ICD-719.46)

## 2010-03-11 NOTE — Letter (Signed)
Summary: Referral - not able to see patient  [facility]  [address] [place], [place] [address]   Phone: [phone]  Fax: [phone]    May 20, 2009   Tera Mater. Clent Ridges, M.D. [address] [place], [place] [address]   Re:   DEMARUS LATTERELL DOB:  1940/09/22 MRN:   [id number]    Dear Dr. Clent Ridges:  Thank you for your kind referral of the above patient.  We have attempted to schedule the recommended procedure Screening Colonoscopy but have not been able to schedule because:   X  The patient was not available by phone and/or has not returned our calls.  ___ The patient declined to schedule the procedure at this time.  We appreciate the referral and hope that we will have the opportunity to treat this patient in the future.    Sincerely,    [facility] [phone]

## 2010-03-13 NOTE — Assessment & Plan Note (Signed)
Summary: Benjamin Cyndia [name]   Vital Signs:  Patient profile:   70 year old male Height:      71 inches Weight:      240 pounds O2 Sat:      96 % Temp:     97.8 degrees F Pulse rate:   70 / minute BP sitting:   140 / 84  (left arm) Cuff size:   large  Vitals Entered By: Pura Spice, RN (March 07, 2010 9:23 AM) CC: Benjamin discuss labs     History of Present Illness: 70 yr old male for a Benjamin. He has severl issues to discuss. First he admits to a poor diet and lack of exercise when we point out his mildly elevated LDL and glucose. Second he would like to try some testosterone replacement again due to lack of energy and some erection problems. He used Androgel a few years ago.   Allergies (verified): No Known Drug Allergies  Past History:  Past Medical History: Reviewed history from 02/07/2008 and no changes required. Hyperlipidemia Benign prostatic hypertrophy with elevated PSA, sees Dr. Retta Diones Anxiety insomnia  Past Surgical History: prostate biopsies in 2003 and again 05-2008 per Dr. Retta Diones, benign right knee arthroscopy repair of large umbilical hernia per Dr. Wenda Low on 01-10-10 colonoscopy 02-28-10 per Dr. Leone Payor, single adenomatous polyp, repeat 5 yrs  Family History: Reviewed history from 02/16/2007 and no changes required. Alzheimer disease  Social History: Reviewed history from 03/04/2009 and no changes required. Married Former Smoker previous alcohol use, quit 2010 Drug use-no wife  with dx this year of parkinsons  and under care  hard time working   Review of Systems  The patient denies anorexia, fever, weight loss, weight gain, vision loss, decreased hearing, hoarseness, chest pain, syncope, dyspnea on exertion, peripheral edema, prolonged cough, headaches, hemoptysis, abdominal pain, melena, hematochezia, severe indigestion/heartburn, hematuria, incontinence, genital sores, muscle weakness, suspicious skin lesions, transient blindness, difficulty walking,  depression, unusual weight change, abnormal bleeding, enlarged lymph nodes, angioedema, breast masses, and testicular masses.    Physical Exam  General:  overweight-appearing.   Head:  Normocephalic and atraumatic without obvious abnormalities. No apparent alopecia or balding. Eyes:  No corneal or conjunctival inflammation noted. EOMI. Perrla. Funduscopic exam benign, without hemorrhages, exudates or papilledema. Vision grossly normal. Ears:  External ear exam shows no significant lesions or deformities.  Otoscopic examination reveals clear canals, tympanic membranes are intact bilaterally without bulging, retraction, inflammation or discharge. Hearing is grossly normal bilaterally. Nose:  External nasal examination shows no deformity or inflammation. Nasal mucosa are pink and moist without lesions or exudates. Mouth:  Oral mucosa and oropharynx without lesions or exudates.  Teeth in good repair. Neck:  No deformities, masses, or tenderness noted. Chest Wall:  No deformities, masses, tenderness or gynecomastia noted. Lungs:  Normal respiratory effort, chest expands symmetrically. Lungs are clear to auscultation, no crackles or wheezes. Heart:  Normal rate and regular rhythm. S1 and S2 normal without gallop, murmur, click, rub or other extra sounds. 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 Msk:  No deformity or scoliosis noted of thoracic or lumbar spine.   Pulses:  R and L carotid,radial,femoral,dorsalis pedis and posterior tibial pulses are full and equal bilaterally Extremities:  No clubbing, cyanosis, edema, or deformity noted with normal full range of motion of all joints.   Neurologic:  No cranial nerve deficits noted. Station and gait are normal.  Plantar reflexes are down-going bilaterally. DTRs are symmetrical throughout. Sensory, motor and coordinative functions appear intact. Skin:   Intact without suspicious lesions or rashes Cervical Nodes:  No lymphadenopathy noted Axillary Nodes:  No palpable lymphadenopathy Inguinal Nodes:  No significant adenopathy Psych:  Cognition and judgment appear intact. Alert and cooperative with normal attention span and concentration. No apparent delusions, illusions, hallucinations   Impression & Recommendations:  Problem # 1:  WELL ADULT EXAM (ICD-V70.0)  Orders: EKG w/ Interpretation (93000)  Complete Medication List: 1)  Lipitor 20 Mg Tabs (Atorvastatin calcium) .Marland Kitchen.. 1 by mouth once daily 2)  Ambien 10 Mg Tabs (Zolpidem tartrate) .... 2 by mouth at bedtime 3)  Omeprazole 40 Mg Cpdr (Omeprazole) .... Once daily 4)  Alprazolam 1 Mg Tabs (Alprazolam) .... Four  times a day as needed anxiety 5)  Viagra 100 Mg Tabs (Sildenafil citrate) .... As needed 6)  Axiron 30 Mg/act Soln (Testosterone) .... Apply under the arms once daily  Other Orders: Venipuncture (45409) TLB-Testosterone, Total (84403-TESTO) Specimen Handling (81191)  Patient Instructions: 1)  Get a testosterone level. Try Axiron.  2)  It is important that you exercise reguarly at least 20 minutes 5 times a week. If you develop chest pain, have severe difficulty breathing, or feel very tired, stop exercising immediately and seek medical attention.  3)  You need to lose weight. Consider a lower calorie diet and regular exercise.  Prescriptions: AXIRON 30 MG/ACT SOLN (TESTOSTERONE) apply under the arms once daily  #90 x 3   Entered and Authorized by:   Nelwyn Salisbury MD   Signed by:   Nelwyn Salisbury MD on 03/07/2010   Method used:   Print then Give to Patient   RxID:   [id number] AMBIEN 10 MG TABS (ZOLPIDEM TARTRATE) 2 by mouth at bedtime  #180 x 1   Entered and Authorized by:   Nelwyn Salisbury MD   Signed by:   Nelwyn Salisbury MD on 03/07/2010   Method used:   Print then Give to Patient   RxID:   [id number] LIPITOR 20 MG TABS (ATORVASTATIN CALCIUM) 1 by mouth  once daily  #90 x 3   Entered and Authorized by:   Nelwyn Salisbury MD   Signed by:   Nelwyn Salisbury MD on 03/07/2010   Method used:   Print then Give to Patient   RxID:   [id number]    Orders Added: 1)  Venipuncture [25956] 2)  TLB-Testosterone, Total [84403-TESTO] 3)  Specimen Handling [99000] 4)  Est. Patient 65& > [99397] 5)  EKG w/ Interpretation [93000]

## 2010-03-13 NOTE — Miscellaneous (Signed)
Summary: [facility] previsit  Clinical Lists Changes  Medications: Added new medication of MOVIPREP 100 GM  SOLR (PEG-KCL-NACL-NASULF-NA ASC-C) As per prep instructions. - Signed Rx of MOVIPREP 100 GM  SOLR (PEG-KCL-NACL-NASULF-NA ASC-C) As per prep instructions.;  #1 x 0;  Signed;  Entered by: Karl Bales RN;  Authorized by: Iva Boop MD, [facility];  Method used: [facility] to [facility]  [phone]*, [address], [place], [place]  [address], Ph: [phone] or [phone], Fax: [phone] Observations: Added new observation of NKA: T (02/17/2010 13:21)    Prescriptions: MOVIPREP 100 GM  SOLR (PEG-KCL-NACL-NASULF-NA ASC-C) As per prep instructions.  #1 x 0   Entered by:   Karl Bales RN   Authorized by:   Iva Boop MD, [facility]   Signed by:   Karl Bales RN on 02/17/2010   Method used:   [facility] to        [facility]  [phone]* (retail)       [address] [place], [place]  [address]       Ph: [phone] or [phone]       Fax: [phone]   RxID:   [id number]

## 2010-03-13 NOTE — Letter (Signed)
Summary: [facility] Instructions  [facility]  [address] [place], [place] [address]   Phone: [phone]  Fax: [phone]       Benjamin [name]    [birth year]    MRN: [id number]        Procedure Day Dorna Bloom:  Wednesday 02-26-10      Arrival Time: 7:30 a.m.     Procedure Time: 8:30 a.m.     Location of Procedure:                    _x _  [facility] (4th Floor)                        PREPARATION FOR COLONOSCOPY WITH MOVIPREP   Starting 5 days prior to your procedure  02-21-10  do not eat nuts, seeds, popcorn, corn, beans, peas,  salads, or any raw vegetables.  Do not take any fiber supplements (e.g. Metamucil, Citrucel, and Benefiber).  THE DAY BEFORE YOUR PROCEDURE         DATE:  02-25-10  DAY:  Tuesday   1.  Drink clear liquids the entire day-NO SOLID FOOD  2.  Do not drink anything colored red or purple.  Avoid juices with pulp.  No orange juice.  3.  Drink at least 64 oz. (8 glasses) of fluid/clear liquids during the day to prevent dehydration and help the prep work efficiently.  CLEAR LIQUIDS INCLUDE: Water Jello Ice Popsicles Tea (sugar ok, no milk/cream) Powdered fruit flavored drinks Coffee (sugar ok, no milk/cream) Gatorade Juice: apple, white grape, white cranberry  Lemonade Clear bullion, consomm, broth Carbonated beverages (any kind) Strained chicken noodle soup Hard Candy                             4.  In the morning, mix first dose of MoviPrep solution:    Empty 1 Pouch A and 1 Pouch B into the disposable container    Add lukewarm drinking water to the top line of the container. Mix to dissolve    Refrigerate (mixed solution should be used within 24 hrs)  5.  Begin drinking the prep at 5:00 p.m. The MoviPrep container is divided by 4 marks.   Every 15 minutes drink the solution down to the next mark (approximately 8 oz) until the full liter is complete.   6.  Follow completed prep with 16 oz of clear liquid of your  choice (Nothing red or purple).  Continue to drink clear liquids until bedtime.  7.  Before going to bed, mix second dose of MoviPrep solution:    Empty 1 Pouch A and 1 Pouch B into the disposable container    Add lukewarm drinking water to the top line of the container. Mix to dissolve    Refrigerate  THE DAY OF YOUR PROCEDURE      DATE:  02-26-10  DAY:  Wednesday  Beginning at  3:30 a.m. (5 hours before procedure):         1. Every 15 minutes, drink the solution down to the next mark (approx 8 oz) until the full liter is complete.  2. Follow completed prep with 16 oz. of clear liquid of your choice.    3. You may drink clear liquids until  6:30 a.m. (2 HOURS BEFORE PROCEDURE).   MEDICATION INSTRUCTIONS  Unless otherwise instructed, you should take regular prescription medications with a small  sip of water   as early as possible the morning of your procedure.           OTHER INSTRUCTIONS  You will need a responsible adult at least 69 years of age to accompany you and drive you home.   This person must remain in the waiting room during your procedure.  Wear loose fitting clothing that is easily removed.  Leave jewelry and other valuables at home.  However, you may wish to bring a book to read or  an iPod/MP3 player to listen to music as you wait for your procedure to start.  Remove all body piercing jewelry and leave at home.  Total time from sign-in until discharge is approximately 2-3 hours.  You should go home directly after your procedure and rest.  You can resume normal activities the  day after your procedure.  The day of your procedure you should not:   Drive   Make legal decisions   Operate machinery   Drink alcohol   Return to work  You will receive specific instructions about eating, activities and medications before you leave.    The above instructions have been reviewed and explained to me by   Karl Bales RN  February 17, 2010 1:41  PM    I fully understand and can verbalize these instructions _____________________________ Date _________

## 2010-03-13 NOTE — Letter (Signed)
Summary: Patient Notice- Polyp Results  [facility]  [address] [place], [place] [address]   Phone: [phone]  Fax: [phone]        March 03, 2010 MRN: [id number]    JP [place] [address] [place], [place]  [address]    Dear Mr. Ayuso,  The polyp removed from your colon was adenomatous. This means that it was pre-cancerous or that  it had the potential to change into cancer over time.  I recommend that you have a repeat colonoscopy in 5 years to determine if you have developed any new polyps over time and screen for colorectal cancer. If you develop any new rectal bleeding, abdominal pain or significant bowel habit changes, please contact us before then.  In addition to repeating colonoscopy, changing health habits may reduce your risk of having more colon or rectal  polyps and possibly, colorectal cancer. You may lower your risk of future polyps and colorectal cancer by adopting healthy habits such as not smoking or using tobacco (if you do), being physically active, losing weight (if overweight), and eating a diet which includes fruits and vegetables and limits red meat. I am pleased to inform you that the colon polyp(s) removed during your recent colonoscopy was (were) found to be benign (no cancer detected) upon pathologic examination.  Please call us if you are having persistent problems or have questions about your condition that have not been fully answered at this time.  Sincerely,  Iva Boop MD, [facility]  This letter has been electronically signed by your physician.  Appended Document: Patient Notice- Polyp Results Letter mailed

## 2010-03-13 NOTE — Procedures (Addendum)
Summary: Colonoscopy  Patient: Benjamin [name] Note: All result statuses are Final unless otherwise noted.  Tests: (1) Colonoscopy (COL)   COL Colonoscopy           DONE (C)     [facility]     [address]     [place], [place]  [address]           COLONOSCOPY PROCEDURE REPORT           PATIENT:  [name], [name]  MR#:  [id number]     BIRTHDATE:  1940-10-18, 70 yrs. old  GENDER:  male     ENDOSCOPIST:  Iva Boop, MD, [facility]     REF. BY:  Tera Mater. Clent Ridges, M.D.     PROCEDURE DATE:  02/26/2010     PROCEDURE:  Colonoscopy with snare polypectomy     ASA CLASS:  Class II     INDICATIONS:  Routine Risk Screening     MEDICATIONS:   Fentanyl 75 mcg IV, Versed 9 mg IV, ADDENDUM:     Benadryl 12.5 mg IV           DESCRIPTION OF PROCEDURE:   After the risks benefits and     alternatives of the procedure were thoroughly explained, informed     consent was obtained.  Digital rectal exam was performed and     revealed no abnormalities.   The LB CF-[id number] endoscope     was introduced through the anus and advanced to the cecum, which     was identified by both the appendix and ileocecal valve, limited     by a redundant colon, and  patient discomfort. the cecum was not     entered, but seen from outside and manipulation with biopsy     forceps, despite multiple pressure points and position changes     9left, supine, right).  He was tender and sore in abdomen before     procedure as he had recently undergone umbilical hernia repair and     2 rounds of antibiotics with GI symptoms associated.  The quality     of the prep was excellent, using MoviPrep.  The instrument was     then slowly withdrawn as the colon was fully examined. Insertion:     13:34 minutes Withdrawal: 11:58 minutes     <<PROCEDUREIMAGES>>           FINDINGS:  A diminutive polyp was found in the distal transverse     colon. It was 5 mm in size.  This was otherwise a normal     examination of the colon, see  above re: views of cecum.     Retroflexed views in the rectum revealed no abnormalities.    The     scope was then withdrawn from the patient and the procedure     completed.           COMPLICATIONS:  None     ENDOSCOPIC IMPRESSION:     1) 5 mm diminutive polyp in the distal transverse colon -     removed     2) Otherwise normal examination with inability to enter cecum,     it was seen using manipulation with biopsy forceps. Excellent     prep.           REPEAT EXAM:  Await pathology to determine, consider earlier than     typical repeat examination (with deep sedation) due to inability     to  enter cecum.           Iva Boop, MD, [facility]           CC:  Tera Mater. Clent Ridges, M.D.     The Patient           n.     REVISED:  02/26/2010 09:29 AM     eSIGNED:   Iva Boop at 02/26/2010 09:29 AM           [name], [id number]  Note: An exclamation mark (!) indicates a result that was not dispersed into the flowsheet. Document Creation Date: 02/26/2010 9:29 AM _______________________________________________________________________  (1) Order result status: Final Collection or observation date-time: 02/26/2010 09:19 Requested date-time:  Receipt date-time:  Reported date-time:  Referring Physician:   Ordering Physician: Stan Head [phone]) Specimen Source:  Source: Launa Grill Order Number: [id number] Lab site:   Appended Document: Colonoscopy     Procedures Next Due Date:    Colonoscopy: 02/2015

## 2010-03-13 NOTE — Letter (Signed)
Summary: Pre Visit Letter Revised  [facility]  [address] [place], [place] [address]   Phone: [phone]  Fax: [phone]        02/11/2010 MRN: [id number] Benjamin [name] [address] [place], [place]  [address]             Procedure Date:  02-26-10   Welcome to the Gastroenterology Division at [facility].    You are scheduled to see a nurse for your pre-procedure visit on 02-17-10 at 1:30p.m. on the 3rd floor at [facility], [address]  We ask that you try to arrive at our office 15 minutes prior to your appointment time to allow for check-in.  Please take a minute to review the attached form.  If you answer "Yes" to one or more of the questions on the first page, we ask that you call the person listed at your earliest opportunity.  If you answer "No" to all of the questions, please complete the rest of the form and bring it to your appointment.    Your nurse visit will consist of discussing your medical and surgical history, your immediate family medical history, and your medications.   If you are unable to list all of your medications on the form, please bring the medication bottles to your appointment and we will list them.  We will need to be aware of both prescribed and over the counter drugs.  We will need to know exact dosage information as well.    Please be prepared to read and sign documents such as consent forms, a financial agreement, and acknowledgement forms.  If necessary, and with your consent, a friend or relative is welcome to sit-in on the nurse visit with you.  Please bring your insurance card so that we may make a copy of it.  If your insurance requires a referral to see a specialist, please bring your referral form from your primary care physician.  No co-pay is required for this nurse visit.     If you cannot keep your appointment, please call [phone] to cancel or reschedule prior to your appointment date.  This  allows [place] the opportunity to schedule an appointment for another patient in need of care.    Thank you for choosing [facility] for your medical needs.  We appreciate the opportunity to care for you.  Please visit [place] at our website  to learn more about our practice.  Sincerely, The Gastroenterology Division

## 2010-03-27 ENCOUNTER — Other Ambulatory Visit: Payer: Self-pay

## 2010-03-27 NOTE — Telephone Encounter (Signed)
Pharmacy notifed rx denied for alprazolam

## 2010-03-27 NOTE — Telephone Encounter (Signed)
No, he got 6 months worth of refills on 01-08-10

## 2010-04-22 LAB — SURGICAL PCR SCREEN: MRSA, PCR: NEGATIVE

## 2010-06-03 ENCOUNTER — Other Ambulatory Visit: Payer: Self-pay | Admitting: Family Medicine

## 2010-06-27 NOTE — Assessment & Plan Note (Signed)
[facility] OFFICE NOTE   Benjamin [name], Benjamin [name]                         MRN:          [id number]  DATE:02/22/2006                            DOB:          1941/01/12    This is a 70 year old gentleman here for complete physical examination  and generally feels fine physically, but asks about a medication change.  We have been treating him for chronic anxiety and insomnia.  He has been  on Valium on an as needed basis for several years.  He says it works  fairly well, but it seems to last in his system too long, sometimes if  he takes one to try to sleep at night he is groggy the next day.  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  He also continues to drink alcohol on a fairly frequent basis and he  realizes there is potential interaction between the two.  He would like  for me to switch him to a shorter acting anxiety medicine if possible.  He still uses Lunesta occasionally for sleep, but again this seems to  make him groggy the next morning.  He continues to watch his diet fairly  closely.  He walk about 1 mile a day while walking his dogs, otherwise  he has had a mildly elevated PSA for the past several years.  He was  referred to see Dr. Sherron Monday on January 22 of last year.  No  procedures were recommended at that time and he was told to follow up in  a year.  He has no trouble urinating.  As noted in his record he did  have biopsies in 2003 from Dr. Earlene Plater, all of which were normal.   Further details of his past medical history, family history, social  history, etc, I refer you to last physical note dated February 20, 2005.   ALLERGIES:  None.   CURRENT MEDICATIONS:  1. Lipitor 10 mg daily.  2. Valium 10 mg b.i.d. as needed.  3. Lunesta 3 mg nightly as needed.   OBJECTIVE:  Height 6 feet zero inches.  Weight 232. Blood pressure  152/92.  Pulse 72 and regular.  GENERAL:   He remains overweight.  SKIN:  Is clear.  Eyes clear.  Sclerae clear.  Pharynx clear.  NECK:  Supple without lymphadenopathy or masses.  LUNGS:  Are clear.  CARDIAC:  Rate and rhythm regular without gallops, murmurs or rubs.  His pulses are full.  EKG shows 1st degree AV block as usual but is otherwise within normal  limits.  ABDOMEN:  Soft, normal bowel sounds, nontender, no masses.  GENITALIA:  Normal male.  RECTAL:  Prostate is moderately enlarged but smooth, it is not tender.  Stool is Hemoccult negative.  EXTREMITIES:  No clubbing, cyanosis or edema.  NEUROLOGIC:  Is grossly intact.   He was here for fasting labs on January 2.  He is more remarkable for  his lipid panel, a PSA and a urinalysis.  Otherwise they were clear.  Triglycerides were  high at 193.  HDL is excellent at 50 and LDL was a  bit high at 110.  His PSA has risen to 5.94, but he did have evidence of  a urinary infection with leukocytes in his urine.  I think this probably  represents an indolent prostatitis, which of course could in part  elevate his PSA.   ASSESSMENT/PLAN:  1. Complete physical.  We talked about getting more exercise and      losing weight and once again I recommended a colonoscopy for him,      but he is quite hesitant.  He told me he would think about it and      let me know.  2. Hyperlipidemia.  We will increase Lipitor to 20 mg per day and      check another lipid panel in 6 months.  3. Anxiety.  We will switch to Ativan 1 mg t.i.d. as needed, #270 with      3 refills.  4. Insomnia.  He will still use Lunesta occasionally, but he may get      better results with the Ativan as above.  5. Prostatitis.  Cipro 500 mg b.i.d. for 14 days.  6. Increased PSA.  Will treat the prostatitis as above but arrange for      him to see Dr. Sherron Monday again after that.     Tera Mater. Clent Ridges, MD  Electronically Signed    SAF/MedQ  DD: 02/22/2006  DT: 02/22/2006  Job #: [id number]

## 2010-07-08 ENCOUNTER — Other Ambulatory Visit: Payer: Self-pay | Admitting: *Deleted

## 2010-07-08 NOTE — Telephone Encounter (Signed)
Refill xanax 1mg  1 po 4 times daily prn for anxiety

## 2010-07-09 MED ORDER — ALPRAZOLAM 1 MG PO TABS: 1.0000 mg | ORAL_TABLET | Freq: Four times a day (QID) | ORAL | Status: DC | PRN

## 2010-07-09 NOTE — Telephone Encounter (Signed)
Call in #120 with 5 rf 

## 2010-07-09 NOTE — Telephone Encounter (Signed)
Rx Done . 

## 2010-09-29 ENCOUNTER — Telehealth: Payer: Self-pay | Admitting: *Deleted

## 2010-09-29 NOTE — Telephone Encounter (Signed)
Pt is asking to go back on Valium and Ambien.  He is having problems with Xanax (highs and lows), building a tolerance, and has gone back to an old prescription of Valium.  Feels so much better on Valium, but needs Ambien to sleep.

## 2010-09-30 MED ORDER — ZOLPIDEM TARTRATE 10 MG PO TABS: ORAL_TABLET | ORAL | Status: DC

## 2010-09-30 MED ORDER — DIAZEPAM 10 MG PO TABS: 10.0000 mg | ORAL_TABLET | Freq: Two times a day (BID) | ORAL | Status: DC

## 2010-09-30 NOTE — Telephone Encounter (Signed)
Stop the Xanax. Call in Valium 10 mg bid, #60 with 5 rf. Also call in Zolpidem 10 mg , take 2 qhs, #60 with 5 rf

## 2010-10-01 ENCOUNTER — Telehealth: Payer: Self-pay

## 2010-10-01 NOTE — Telephone Encounter (Signed)
Pt aware Rx called into pharmacy °

## 2010-11-26 ENCOUNTER — Encounter: Payer: Self-pay | Admitting: Family Medicine

## 2010-11-27 ENCOUNTER — Ambulatory Visit (INDEPENDENT_AMBULATORY_CARE_PROVIDER_SITE_OTHER): Payer: Medicare Other | Admitting: Family Medicine

## 2010-11-27 ENCOUNTER — Encounter: Payer: Self-pay | Admitting: Family Medicine

## 2010-11-27 DIAGNOSIS — R079 Chest pain, unspecified: Secondary | ICD-10-CM

## 2010-11-27 DIAGNOSIS — F418 Other specified anxiety disorders: Secondary | ICD-10-CM

## 2010-11-27 DIAGNOSIS — K219 Gastro-esophageal reflux disease without esophagitis: Secondary | ICD-10-CM

## 2010-11-27 DIAGNOSIS — F341 Dysthymic disorder: Secondary | ICD-10-CM

## 2010-11-27 MED ORDER — SILDENAFIL CITRATE 100 MG PO TABS: 100.0000 mg | ORAL_TABLET | ORAL | Status: DC | PRN

## 2010-11-27 MED ORDER — ALPRAZOLAM 1 MG PO TABS: 0.5000 mg | ORAL_TABLET | Freq: Three times a day (TID) | ORAL | Status: DC | PRN

## 2010-11-27 MED ORDER — OMEPRAZOLE MAGNESIUM 20 MG PO TBEC: 20.0000 mg | DELAYED_RELEASE_TABLET | Freq: Every day | ORAL | Status: DC

## 2010-11-27 MED ORDER — TRAZODONE HCL 50 MG PO TABS: 50.0000 mg | ORAL_TABLET | Freq: Every day | ORAL | Status: DC

## 2010-11-27 NOTE — Progress Notes (Signed)
  Subjective:    Patient ID: Benjamin [name], male    DOB: 01-Aug-1940, 70 y.o.   MRN: [id number]  HPI Here to follow up an ER visit to [facility] in [place] on 11-19-10 for chest pains. His tests were all normal including EKG, CXR, cardiac enzymes, and a gall bladder US. They felt this was a GI problem and told him to take Prilosec 20 mg a day. He was also off Xanax for a month and his stress levels were through the roof. He has gone back on Xanax and no her feels much better. He has had no chest pains ever since.  Review of Systems  Constitutional: Negative.   Respiratory: Negative.   Cardiovascular: Positive for chest pain. Negative for palpitations and leg swelling.  Gastrointestinal: Negative.   Psychiatric/Behavioral: Positive for sleep disturbance, dysphoric mood and agitation. The patient is nervous/anxious.        Objective:   Physical Exam  Constitutional: He appears well-developed and well-nourished.  Neck: No thyromegaly present.  Cardiovascular: Normal rate, regular rhythm, normal heart sounds and intact distal pulses.   Pulmonary/Chest: Effort normal and breath sounds normal.  Abdominal: Soft. Bowel sounds are normal. He exhibits no distension and no mass. There is no tenderness. There is no rebound and no guarding.  Lymphadenopathy:    He has no cervical adenopathy.  Psychiatric: He has a normal mood and affect. His behavior is normal. Thought content normal.      This was an esophageal spasm so stay on prilosec. refer to GI . Add trazodone to the Xanax. recheck in 2 weeks

## 2010-12-09 ENCOUNTER — Telehealth: Payer: Self-pay | Admitting: Family Medicine

## 2010-12-09 NOTE — Telephone Encounter (Signed)
Refill request for Alprazolam 1 mg and pt last here on 11/27/10. How many times a day? I see for tid and qid.

## 2010-12-10 MED ORDER — ALPRAZOLAM 1 MG PO TABS: 0.5000 mg | ORAL_TABLET | Freq: Three times a day (TID) | ORAL | Status: DC | PRN

## 2010-12-10 NOTE — Telephone Encounter (Signed)
Script called in

## 2010-12-10 NOTE — Telephone Encounter (Signed)
Call this in for tid, #90 with 5 rf

## 2010-12-19 ENCOUNTER — Telehealth: Payer: Self-pay | Admitting: *Deleted

## 2010-12-19 ENCOUNTER — Telehealth: Payer: Self-pay | Admitting: Family Medicine

## 2010-12-19 MED ORDER — OMEPRAZOLE MAGNESIUM 20 MG PO TBEC: 20.0000 mg | DELAYED_RELEASE_TABLET | Freq: Every day | ORAL | Status: DC

## 2010-12-19 MED ORDER — OMEPRAZOLE 40 MG PO CPDR: 40.0000 mg | DELAYED_RELEASE_CAPSULE | Freq: Every day | ORAL | Status: DC

## 2010-12-19 NOTE — Telephone Encounter (Signed)
Requesting new rx for omeprazole 20mg , last refill was for omeprazole 40 mg.  Pt requesting 20mg .

## 2010-12-19 NOTE — Telephone Encounter (Signed)
rx sent in electronically 

## 2010-12-24 NOTE — Telephone Encounter (Signed)
done

## 2011-02-17 ENCOUNTER — Ambulatory Visit (INDEPENDENT_AMBULATORY_CARE_PROVIDER_SITE_OTHER): Payer: Medicare Other | Admitting: Family Medicine

## 2011-02-17 ENCOUNTER — Encounter: Payer: Self-pay | Admitting: Family Medicine

## 2011-02-17 VITALS — BP 152/94 | HR 58 | Temp 98.0°F | Wt 238.0 lb

## 2011-02-17 DIAGNOSIS — J40 Bronchitis, not specified as acute or chronic: Secondary | ICD-10-CM

## 2011-02-17 MED ORDER — AMOXICILLIN-POT CLAVULANATE 875-125 MG PO TABS: 1.0000 | ORAL_TABLET | Freq: Two times a day (BID) | ORAL | Status: AC

## 2011-02-17 NOTE — Progress Notes (Signed)
  Subjective:    Patient ID: Benjamin [name], male    DOB: 1940/05/18, 71 y.o.   MRN: [id number]  HPI Here for one week of chest tightness and coughing up green sputum. No fever.    Review of Systems  Constitutional: Negative.   HENT: Negative.   Eyes: Negative.   Respiratory: Positive for cough, chest tightness and wheezing.        Objective:   Physical Exam  Constitutional: He appears well-developed and well-nourished.  HENT:  Right Ear: External ear normal.  Left Ear: External ear normal.  Nose: Nose normal.  Mouth/Throat: Oropharynx is clear and moist. No oropharyngeal exudate.  Eyes: Conjunctivae are normal.  Pulmonary/Chest: Effort normal and breath sounds normal.  Lymphadenopathy:    He has no cervical adenopathy.          Assessment & Plan:  Drink fluids

## 2011-03-18 ENCOUNTER — Encounter: Payer: Medicare Other | Admitting: Family Medicine

## 2011-04-13 ENCOUNTER — Other Ambulatory Visit (INDEPENDENT_AMBULATORY_CARE_PROVIDER_SITE_OTHER): Payer: Medicare Other

## 2011-04-13 DIAGNOSIS — E785 Hyperlipidemia, unspecified: Secondary | ICD-10-CM

## 2011-04-13 DIAGNOSIS — N400 Enlarged prostate without lower urinary tract symptoms: Secondary | ICD-10-CM

## 2011-04-13 DIAGNOSIS — Z0000 Encounter for general adult medical examination without abnormal findings: Secondary | ICD-10-CM

## 2011-04-13 DIAGNOSIS — Z136 Encounter for screening for cardiovascular disorders: Secondary | ICD-10-CM

## 2011-04-13 LAB — HEPATIC FUNCTION PANEL
Alkaline Phosphatase: 61 U/L (ref 39–117)
Bilirubin, Direct: 0.1 mg/dL (ref 0.0–0.3)
Total Protein: 6.4 g/dL (ref 6.0–8.3)

## 2011-04-13 LAB — LIPID PANEL
Cholesterol: 261 mg/dL — ABNORMAL HIGH (ref 0–200)
HDL: 49.6 mg/dL (ref >39.00)
Triglycerides: 165 mg/dL — ABNORMAL HIGH (ref 0.0–149.0)
VLDL: 33 mg/dL (ref 0.0–40.0)

## 2011-04-13 LAB — CBC WITH DIFFERENTIAL/PLATELET
Basophils Relative: 0.7 % (ref 0.0–3.0)
Eosinophils Absolute: 0 10*3/uL (ref 0.0–0.7)
Eosinophils Relative: 1.3 % (ref 0.0–5.0)
Lymphocytes Relative: 27.6 % (ref 12.0–46.0)
MCHC: 33.7 g/dL (ref 30.0–36.0)
Neutrophils Relative %: 60.9 % (ref 43.0–77.0)
RBC: 5 Mil/uL (ref 4.22–5.81)
WBC: 3.8 10*3/uL — ABNORMAL LOW (ref 4.5–10.5)

## 2011-04-13 LAB — POCT URINALYSIS DIPSTICK
Bilirubin, UA: NEGATIVE
Glucose, UA: NEGATIVE
Ketones, UA: NEGATIVE
pH, UA: 5

## 2011-04-13 LAB — BASIC METABOLIC PANEL
Calcium: 9.1 mg/dL (ref 8.4–10.5)
Creatinine, Ser: 1 mg/dL (ref 0.4–1.5)

## 2011-04-14 ENCOUNTER — Ambulatory Visit (INDEPENDENT_AMBULATORY_CARE_PROVIDER_SITE_OTHER): Payer: Medicare Other | Admitting: Family Medicine

## 2011-04-14 ENCOUNTER — Encounter: Payer: Self-pay | Admitting: Family Medicine

## 2011-04-14 VITALS — BP 140/80 | HR 68 | Temp 98.2°F | Ht 71.75 in | Wt 234.0 lb

## 2011-04-14 DIAGNOSIS — E119 Type 2 diabetes mellitus without complications: Secondary | ICD-10-CM

## 2011-04-14 DIAGNOSIS — Z0000 Encounter for general adult medical examination without abnormal findings: Secondary | ICD-10-CM

## 2011-04-14 MED ORDER — METFORMIN HCL 500 MG PO TABS: 500.0000 mg | ORAL_TABLET | Freq: Two times a day (BID) | ORAL | Status: DC

## 2011-04-14 MED ORDER — AMPHETAMINE-DEXTROAMPHET ER 10 MG PO CP24: 10.0000 mg | ORAL_CAPSULE | ORAL | Status: DC

## 2011-04-14 MED ORDER — ATORVASTATIN CALCIUM 20 MG PO TABS: 20.0000 mg | ORAL_TABLET | Freq: Every day | ORAL | Status: DC

## 2011-04-14 NOTE — Progress Notes (Signed)
Quick Note:  Pt is here in office today for a CPE and we will go over results now. ______

## 2011-04-14 NOTE — Progress Notes (Signed)
Subjective:    Patient ID: Benjamin [name], male    DOB: 1940/10/15, 71 y.o.   MRN: [id number]  HPI 71 yr old male for a cpx. He has several problems to discuss. First he describes a lot of stress in his life, including financial concerns and job stresses, and also his wife's health issues. She has Parkinsons disease, and it seems she is rapidly declining in health. Benjamin [name] has used Xanax off and on over the years, and this does help him relax. He has tried Effexor and Wellbutrin in the past but he did not like they made him feel. He says he does not want to try any more "depression medications" in the future. He describes himself as being tired a lot, unfocused, and unmotivated. He sleeps well. He does not get much exercise.    Review of Systems  Constitutional: Positive for fatigue. Negative for fever, chills, diaphoresis, activity change, appetite change and unexpected weight change.  HENT: Negative.   Eyes: Negative.   Respiratory: Negative.   Cardiovascular: Negative.   Gastrointestinal: Negative.   Genitourinary: Negative.   Musculoskeletal: Negative.   Skin: Negative.   Neurological: Negative.   Hematological: Negative.   Psychiatric/Behavioral: Positive for dysphoric mood and decreased concentration. Negative for suicidal ideas, hallucinations, behavioral problems, confusion, sleep disturbance, self-injury and agitation. The patient is nervous/anxious. The patient is not hyperactive.        Objective:   Physical Exam  Constitutional: He is oriented to person, place, and time. He appears well-developed and well-nourished. No distress.  HENT:  Head: Normocephalic and atraumatic.  Right Ear: External ear normal.  Left Ear: External ear normal.  Nose: Nose normal.  Mouth/Throat: Oropharynx is clear and moist. No oropharyngeal exudate.  Eyes: Conjunctivae and EOM are normal. Pupils are equal, round, and reactive to light. Right eye exhibits no discharge. Left eye exhibits no discharge.  No scleral icterus.  Neck: Neck supple. No JVD present. No tracheal deviation present. No thyromegaly present.  Cardiovascular: Normal rate, regular rhythm, normal heart sounds and intact distal pulses.  Exam reveals no gallop and no friction rub.   No murmur heard.      EKG normal   Pulmonary/Chest: Effort normal and breath sounds normal. No respiratory distress. He has no wheezes. He has no rales. He exhibits no tenderness.  Abdominal: Soft. Bowel sounds are normal. He exhibits no distension and no mass. There is no tenderness. There is no rebound and no guarding.  Genitourinary: Rectum normal and penis normal. Guaiac negative stool. No penile tenderness.       Prostate is firm and quite enlarged, no nodules   Musculoskeletal: Normal range of motion. He exhibits no edema and no tenderness.  Lymphadenopathy:    He has no cervical adenopathy.  Neurological: He is alert and oriented to person, place, and time. He has normal reflexes. No cranial nerve deficit. He exhibits normal muscle tone. Coordination normal.  Skin: Skin is warm and dry. No rash noted. He is not diaphoretic. No erythema. No pallor.  Psychiatric: He has a normal mood and affect. His behavior is normal. Judgment and thought content normal.          Assessment & Plan:  Well exam. We have started him on Metformin for newly diagnosed diabetes type 2. He will get back on Lipitor, which he had stopped for awhile. We will recheck labs in 90 days. His PSA is over 11, which is the highest I have seen for him. He has not seen  Urology for 2 years now, so i insisted he see Dr. Retta Diones again soon. We will try low dose Adderall XR to help his focus at work. He will follow up in one month for this.

## 2011-05-11 ENCOUNTER — Other Ambulatory Visit: Payer: Self-pay | Admitting: Family Medicine

## 2011-05-11 NOTE — Telephone Encounter (Signed)
Last note said to ROV in 1 month . He is due for a FU. He should make a follow up appt with Dr Clent Ridges   IN the meantime can rx  15  Of the adderall xr 10

## 2011-05-11 NOTE — Telephone Encounter (Signed)
Pt needs new rx generic adderall xr 10mg . Pt is due for rx this Friday. Pt is aware MD out of office until Monday 05-18-2011

## 2011-05-12 MED ORDER — AMPHETAMINE-DEXTROAMPHET ER 10 MG PO CP24: 10.0000 mg | ORAL_CAPSULE | ORAL | Status: DC

## 2011-05-12 NOTE — Telephone Encounter (Signed)
Script is ready for pick up and spoke with pt. 

## 2011-05-12 NOTE — Telephone Encounter (Signed)
4-2 lmom for pt to callback to sch ov with Dr Clent Ridges

## 2011-05-27 ENCOUNTER — Encounter: Payer: Self-pay | Admitting: Family Medicine

## 2011-05-27 ENCOUNTER — Ambulatory Visit (INDEPENDENT_AMBULATORY_CARE_PROVIDER_SITE_OTHER): Payer: Medicare Other | Admitting: Family Medicine

## 2011-05-27 VITALS — BP 140/84 | HR 79 | Temp 98.1°F | Wt 232.0 lb

## 2011-05-27 DIAGNOSIS — G4700 Insomnia, unspecified: Secondary | ICD-10-CM

## 2011-05-27 DIAGNOSIS — F909 Attention-deficit hyperactivity disorder, unspecified type: Secondary | ICD-10-CM

## 2011-05-27 DIAGNOSIS — F411 Generalized anxiety disorder: Secondary | ICD-10-CM

## 2011-05-27 DIAGNOSIS — F419 Anxiety disorder, unspecified: Secondary | ICD-10-CM

## 2011-05-27 MED ORDER — AMPHETAMINE-DEXTROAMPHET ER 10 MG PO CP24: 10.0000 mg | ORAL_CAPSULE | ORAL | Status: DC

## 2011-05-27 NOTE — Progress Notes (Signed)
  Subjective:    Patient ID: Smayan Hackbart, male    DOB: 1940/03/03, 71 y.o.   MRN: [id number]  HPI Here to follow up on ADHD and anxiety. He has been trying Adderall XR in the mornings and using Xanax at bedtime, and this has worked very well for him. He feels more alert and more in control of things now. He can focus and get things done on time. No side effects.    Review of Systems  Constitutional: Negative.   Respiratory: Negative.   Cardiovascular: Negative.   Neurological: Negative.   Psychiatric/Behavioral: Negative.        Objective:   Physical Exam  Constitutional: He is oriented to person, place, and time. He appears well-developed and well-nourished.  Neurological: He is alert and oriented to person, place, and time.  Psychiatric: He has a normal mood and affect. His behavior is normal. Thought content normal.          Assessment & Plan:  Refilled current meds. Recheck in 6 months

## 2011-06-16 ENCOUNTER — Other Ambulatory Visit: Payer: Self-pay | Admitting: Family Medicine

## 2011-07-07 ENCOUNTER — Telehealth: Payer: Self-pay | Admitting: Family Medicine

## 2011-07-07 MED ORDER — ATORVASTATIN CALCIUM 20 MG PO TABS: 20.0000 mg | ORAL_TABLET | Freq: Every day | ORAL | Status: DC

## 2011-07-07 NOTE — Telephone Encounter (Signed)
Pt requested a 90 day supply for Atorvastatin 20 mg and I did send e-scribe.

## 2011-07-08 ENCOUNTER — Telehealth: Payer: Self-pay | Admitting: Family Medicine

## 2011-07-08 NOTE — Telephone Encounter (Signed)
Call in Zolpidem #60 with 5 rf. Call in Xanax #90 with 5 rf

## 2011-07-08 NOTE — Telephone Encounter (Signed)
Refill request for Zolpidem Tartrate 10 mg take 2 tablets qhs prn and also Alprazolam 1 mg take 1 po tid prn and pt last here on 05/27/11.

## 2011-07-10 MED ORDER — ZOLPIDEM TARTRATE 10 MG PO TABS: ORAL_TABLET | ORAL | Status: DC

## 2011-07-10 MED ORDER — ALPRAZOLAM 1 MG PO TABS: 0.5000 mg | ORAL_TABLET | Freq: Three times a day (TID) | ORAL | Status: DC | PRN

## 2011-07-10 NOTE — Telephone Encounter (Signed)
I called in both scripts. 

## 2011-09-08 ENCOUNTER — Telehealth: Payer: Self-pay | Admitting: Family Medicine

## 2011-09-08 NOTE — Telephone Encounter (Signed)
Patient called stating that he need a refill of his adderall,xanax, ambiem, and omeprazole. Please assist.

## 2011-09-10 MED ORDER — AMPHETAMINE-DEXTROAMPHET ER 10 MG PO CP24: 10.0000 mg | ORAL_CAPSULE | ORAL | Status: DC

## 2011-09-10 NOTE — Telephone Encounter (Signed)
I refilled the Adderall, but NO refills on the other meds. I gave him  6 month supply of all these on 07-10-11

## 2011-09-11 NOTE — Telephone Encounter (Signed)
I left voice message

## 2011-12-30 ENCOUNTER — Other Ambulatory Visit: Payer: Self-pay | Admitting: Family Medicine

## 2012-01-01 ENCOUNTER — Encounter: Payer: Self-pay | Admitting: Family Medicine

## 2012-01-01 ENCOUNTER — Ambulatory Visit (INDEPENDENT_AMBULATORY_CARE_PROVIDER_SITE_OTHER): Payer: Medicare Other | Admitting: Family Medicine

## 2012-01-01 VITALS — BP 154/86 | HR 71 | Temp 98.2°F | Wt 231.0 lb

## 2012-01-01 DIAGNOSIS — K219 Gastro-esophageal reflux disease without esophagitis: Secondary | ICD-10-CM

## 2012-01-01 DIAGNOSIS — F411 Generalized anxiety disorder: Secondary | ICD-10-CM

## 2012-01-01 DIAGNOSIS — G4700 Insomnia, unspecified: Secondary | ICD-10-CM

## 2012-01-01 MED ORDER — ZOLPIDEM TARTRATE 10 MG PO TABS: ORAL_TABLET | ORAL | Status: DC

## 2012-01-01 MED ORDER — ALPRAZOLAM 1 MG PO TABS: 1.0000 mg | ORAL_TABLET | Freq: Four times a day (QID) | ORAL | Status: DC | PRN

## 2012-01-01 NOTE — Progress Notes (Signed)
  Subjective:    Patient ID: Benjamin [name], male    DOB: 10/03/1940, 71 y.o.   MRN: [id number]  HPI Here to follow up. He has been having a rough time with family issues and his stress levels are high. His wife has health problems, and the family has some major controversies. He is still travelling a lot for work.    Review of Systems  Constitutional: Negative.   Cardiovascular: Negative.   Neurological: Negative.   Psychiatric/Behavioral: Positive for decreased concentration. Negative for hallucinations, behavioral problems, confusion, sleep disturbance, self-injury, dysphoric mood and agitation. The patient is nervous/anxious. The patient is not hyperactive.        Objective:   Physical Exam  Constitutional: He is oriented to person, place, and time. He appears well-developed and well-nourished.  Neurological: He is alert and oriented to person, place, and time.  Psychiatric: He has a normal mood and affect. His behavior is normal. Thought content normal.          Assessment & Plan:  Refilled meds

## 2012-04-12 ENCOUNTER — Other Ambulatory Visit (INDEPENDENT_AMBULATORY_CARE_PROVIDER_SITE_OTHER): Payer: Medicare Other

## 2012-04-12 DIAGNOSIS — E785 Hyperlipidemia, unspecified: Secondary | ICD-10-CM

## 2012-04-12 DIAGNOSIS — Z0000 Encounter for general adult medical examination without abnormal findings: Secondary | ICD-10-CM

## 2012-04-12 LAB — HEPATIC FUNCTION PANEL
AST: 25 U/L (ref 0–37)
Albumin: 4 g/dL (ref 3.5–5.2)
Alkaline Phosphatase: 60 U/L (ref 39–117)
Total Protein: 6.6 g/dL (ref 6.0–8.3)

## 2012-04-12 LAB — POCT URINALYSIS DIPSTICK
Bilirubin, UA: NEGATIVE
Blood, UA: NEGATIVE
Ketones, UA: NEGATIVE
Spec Grav, UA: 1.02
pH, UA: 5.5

## 2012-04-12 LAB — LIPID PANEL
Cholesterol: 239 mg/dL — ABNORMAL HIGH (ref 0–200)
Total CHOL/HDL Ratio: 5
Triglycerides: 249 mg/dL — ABNORMAL HIGH (ref 0.0–149.0)

## 2012-04-12 LAB — CBC WITH DIFFERENTIAL/PLATELET
Basophils Absolute: 0 10*3/uL (ref 0.0–0.1)
Eosinophils Absolute: 0.1 10*3/uL (ref 0.0–0.7)
Hemoglobin: 15.1 g/dL (ref 13.0–17.0)
Lymphocytes Relative: 35.9 % (ref 12.0–46.0)
MCHC: 34 g/dL (ref 30.0–36.0)
Neutro Abs: 1.9 10*3/uL (ref 1.4–7.7)
RDW: 13.4 % (ref 11.5–14.6)

## 2012-04-12 LAB — BASIC METABOLIC PANEL
Calcium: 9 mg/dL (ref 8.4–10.5)
Chloride: 108 mEq/L (ref 96–112)
Creatinine, Ser: 1 mg/dL (ref 0.4–1.5)

## 2012-04-14 NOTE — Progress Notes (Signed)
Quick Note:  I spoke with pt ______ 

## 2012-04-18 ENCOUNTER — Encounter: Payer: Self-pay | Admitting: Family Medicine

## 2012-04-18 ENCOUNTER — Ambulatory Visit (INDEPENDENT_AMBULATORY_CARE_PROVIDER_SITE_OTHER): Payer: Medicare Other | Admitting: Family Medicine

## 2012-04-18 VITALS — BP 140/86 | HR 65 | Temp 97.9°F | Ht 71.5 in | Wt 236.0 lb

## 2012-04-18 DIAGNOSIS — F341 Dysthymic disorder: Secondary | ICD-10-CM

## 2012-04-18 DIAGNOSIS — Z0000 Encounter for general adult medical examination without abnormal findings: Secondary | ICD-10-CM

## 2012-04-18 DIAGNOSIS — E785 Hyperlipidemia, unspecified: Secondary | ICD-10-CM

## 2012-04-18 DIAGNOSIS — K219 Gastro-esophageal reflux disease without esophagitis: Secondary | ICD-10-CM

## 2012-04-18 DIAGNOSIS — F418 Other specified anxiety disorders: Secondary | ICD-10-CM

## 2012-04-18 MED ORDER — NAPROXEN 500 MG PO TABS: 500.0000 mg | ORAL_TABLET | Freq: Two times a day (BID) | ORAL | Status: DC

## 2012-04-18 MED ORDER — PANTOPRAZOLE SODIUM 40 MG PO TBEC: 40.0000 mg | DELAYED_RELEASE_TABLET | Freq: Every day | ORAL | Status: DC

## 2012-04-18 MED ORDER — ROSUVASTATIN CALCIUM 10 MG PO TABS: 10.0000 mg | ORAL_TABLET | Freq: Every day | ORAL | Status: DC

## 2012-04-18 MED ORDER — ESCITALOPRAM OXALATE 5 MG PO TABS: 5.0000 mg | ORAL_TABLET | Freq: Every day | ORAL | Status: DC

## 2012-04-18 MED ORDER — DIAZEPAM 10 MG PO TABS: 10.0000 mg | ORAL_TABLET | Freq: Three times a day (TID) | ORAL | Status: AC | PRN

## 2012-04-18 NOTE — Progress Notes (Signed)
Subjective:    Patient ID: Benjamin [name], male    DOB: [birth year], 72 y.o.   MRN: [id number]  HPI 72 yr old male for a cpx. He has a number of problems to discuss today, primarily his anxiety and depression. He had been on Xanax for several years and had done well and far as his anxiety goes, but he has been struggling with more depression over the last few months. He went back on Valium that he had used a few years ago, and this works a little better then Xanax. However he has felt sad and hopeless with decreased energy. He denies any suicidal thoughts. He relates how he has tried a number of agents over the years for depression, with mixed results. He tried Effexor and this helped a bit but made his anxiety worse. He tried Serzone, but this did not make much of a difference. He tried Wellbutrin but this made his anxiety worse. Also he has had some stiffness and pain in the hips for a few months. This is worse when he sits for prolonged periods, such as when driving his car. He is better when he moves around. His GERD has been acting up.    Review of Systems  Constitutional: Negative.   HENT: Negative.   Eyes: Negative.   Respiratory: Negative.   Cardiovascular: Negative.   Gastrointestinal: Negative.   Genitourinary: Negative.   Musculoskeletal: Positive for arthralgias. Negative for myalgias, back pain, joint swelling and gait problem.  Skin: Negative.   Neurological: Negative.   Psychiatric/Behavioral: Positive for dysphoric mood. Negative for behavioral problems, confusion, decreased concentration and agitation. The patient is nervous/anxious.        Objective:   Physical Exam  Constitutional: He is oriented to person, place, and time. He appears well-developed and well-nourished. No distress.  HENT:  Head: Normocephalic and atraumatic.  Right Ear: External ear normal.  Left Ear: External ear normal.  Nose: Nose normal.  Mouth/Throat: Oropharynx is clear and moist. No oropharyngeal  exudate.  Eyes: Conjunctivae and EOM are normal. Pupils are equal, round, and reactive to light. Right eye exhibits no discharge. Left eye exhibits no discharge. No scleral icterus.  Neck: Neck supple. No JVD present. No tracheal deviation present. No thyromegaly present.  Cardiovascular: Normal rate, regular rhythm, normal heart sounds and intact distal pulses.  Exam reveals no gallop and no friction rub.   No murmur heard. 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 No respiratory distress. He has no wheezes. He has no rales. He exhibits no tenderness.  Abdominal: Soft. Bowel sounds are normal. He exhibits no distension. There is no tenderness. There is no rebound and no guarding.  nontender reducible umbilical hernia  Genitourinary: Rectum normal, prostate normal and penis normal. Guaiac negative stool. No penile tenderness.  Musculoskeletal: Normal range of motion. He exhibits no edema and no tenderness.  Lymphadenopathy:    He has no cervical adenopathy.  Neurological: He is alert and oriented to person, place, and time. He has normal reflexes. No cranial nerve deficit. He exhibits normal muscle tone. Coordination normal.  Skin: Skin is warm and dry. No rash noted. He is not diaphoretic. No erythema. No pallor.  Psychiatric: He has a normal mood and affect. His behavior is normal. Judgment and thought content normal.          Assessment & Plan:  Well exam. Get back on Protonix. I think he would do well on an SSRI,  so try Lexapro at a low dose and we can titrate up as needed. Stay on Valium. Try Naproxen for the bursitis. Given samples for Crestor and we will recheck lipids in 90 days.

## 2012-06-02 ENCOUNTER — Telehealth: Payer: Self-pay | Admitting: Family Medicine

## 2012-06-02 NOTE — Telephone Encounter (Signed)
Caller: Urijah/Patient; Phone: [phone]; Reason for Call: Patient calling about Rx for lexapro.  States started lexapro 5mg  and has increased the dose per Dr.  Clent Ridges to 10mg  but has run out of medication.  Would like Rx sent to [facility] for lexapro 10mg  daily.  Declines new triage; info to office for provider review/Rx/callback.  May reach patient at [phone].  Krs/can

## 2012-06-02 NOTE — Telephone Encounter (Signed)
Refill Lexapro 10 mg daily for one year

## 2012-06-03 MED ORDER — ESCITALOPRAM OXALATE 10 MG PO TABS: 10.0000 mg | ORAL_TABLET | Freq: Every day | ORAL | Status: DC

## 2012-06-03 NOTE — Telephone Encounter (Signed)
Rx sent to pharmacy.  Called and spoke with pt and pt is aware.  

## 2012-06-29 ENCOUNTER — Other Ambulatory Visit: Payer: Self-pay | Admitting: Family Medicine

## 2012-06-29 NOTE — Telephone Encounter (Signed)
Script sent e-scribe and spoke with pt. 

## 2012-08-09 ENCOUNTER — Ambulatory Visit (INDEPENDENT_AMBULATORY_CARE_PROVIDER_SITE_OTHER)
Admission: RE | Admit: 2012-08-09 | Discharge: 2012-08-09 | Disposition: A | Discharge status: Home / Self Care | Payer: Medicare Other | Source: Ambulatory Visit | Attending: Family Medicine | Admitting: Family Medicine

## 2012-08-09 ENCOUNTER — Encounter: Payer: Self-pay | Admitting: Family Medicine

## 2012-08-09 ENCOUNTER — Ambulatory Visit (INDEPENDENT_AMBULATORY_CARE_PROVIDER_SITE_OTHER): Payer: Medicare Other | Admitting: Family Medicine

## 2012-08-09 VITALS — BP 144/80 | HR 70 | Temp 98.2°F | Wt 230.0 lb

## 2012-08-09 DIAGNOSIS — S93409A Sprain of unspecified ligament of unspecified ankle, initial encounter: Secondary | ICD-10-CM

## 2012-08-09 DIAGNOSIS — S82401A Unspecified fracture of shaft of right fibula, initial encounter for closed fracture: Secondary | ICD-10-CM

## 2012-08-09 DIAGNOSIS — R609 Edema, unspecified: Secondary | ICD-10-CM

## 2012-08-09 DIAGNOSIS — R600 Localized edema: Secondary | ICD-10-CM

## 2012-08-09 DIAGNOSIS — S82409A Unspecified fracture of shaft of unspecified fibula, initial encounter for closed fracture: Secondary | ICD-10-CM

## 2012-08-09 NOTE — Progress Notes (Signed)
  Subjective:    Patient ID: Benjamin [name], male    DOB: 01/31/1941, 72 y.o.   MRN: [id number]  HPI Here for an injury to the right ankle which occurred 2 weeks ago in [place] while he was hiking. He slipped and rolled down a hill. The ankle swelled immediately and it has remained swollen and painful, especially the lateral ankle. He had extensive bruising in the foot which is clearing now. He has most of his pain when he is walking on the foot, not at rest. He has also noticed swelling in the entire lower right leg, especially the calf. No SOB or chest pain. He has done nothing for the ankle. He [place] way [place] and then all the way back.    Review of Systems  Constitutional: Negative.   Respiratory: Negative.   Cardiovascular: Positive for leg swelling. Negative for chest pain and palpitations.  Musculoskeletal: Positive for joint swelling and arthralgias.       Objective:   Physical Exam  Constitutional: He appears well-developed and well-nourished. No distress.  Walks normally   Musculoskeletal:  The entire right ankle and foot are swollen. He is tender over the lateral malleolus and the distal fibula. Full ROM. The calf is tight and mildly tender. No cords are felt.           Assessment & Plan:  He has had a significant ankle strain at least with a possible distal fibular fracture. We will send him for Xrays today. Also we will send him for venous dopplers to rule out any DVT. He did have prolonged periods of sitting recently.

## 2012-08-09 NOTE — Addendum Note (Signed)
Addended by: Gershon Crane A on: 08/09/2012 03:59 PM   Modules accepted: Orders

## 2012-08-09 NOTE — Progress Notes (Signed)
Quick Note:  I spoke with pt ______ 

## 2012-08-10 ENCOUNTER — Encounter (INDEPENDENT_AMBULATORY_CARE_PROVIDER_SITE_OTHER): Payer: Medicare Other

## 2012-08-10 DIAGNOSIS — R600 Localized edema: Secondary | ICD-10-CM

## 2012-08-10 DIAGNOSIS — M7989 Other specified soft tissue disorders: Secondary | ICD-10-CM

## 2012-08-10 DIAGNOSIS — M79609 Pain in unspecified limb: Secondary | ICD-10-CM

## 2012-08-15 NOTE — Progress Notes (Signed)
Quick Note:  I spoke with pt ______ 

## 2012-08-30 ENCOUNTER — Telehealth: Payer: Self-pay | Admitting: Family Medicine

## 2012-08-30 NOTE — Telephone Encounter (Signed)
Call in Xanax 1 mg every 6 hours prn anxiety, #120 with 5 rf

## 2012-08-30 NOTE — Telephone Encounter (Signed)
Refill request for Alprazolam 1 mg take 1 po q6hrs prn.

## 2012-09-01 MED ORDER — ALPRAZOLAM 1 MG PO TABS: 1.0000 mg | ORAL_TABLET | Freq: Four times a day (QID) | ORAL | Status: DC | PRN

## 2012-09-01 NOTE — Telephone Encounter (Signed)
I called in script 

## 2012-10-12 ENCOUNTER — Other Ambulatory Visit: Payer: Self-pay | Admitting: Family Medicine

## 2012-10-12 NOTE — Telephone Encounter (Signed)
Call in #60 with 5 rf 

## 2012-12-10 ENCOUNTER — Other Ambulatory Visit: Payer: Self-pay | Admitting: Family Medicine

## 2013-02-06 ENCOUNTER — Other Ambulatory Visit: Payer: Self-pay | Admitting: Family Medicine

## 2013-03-02 ENCOUNTER — Other Ambulatory Visit: Payer: Self-pay | Admitting: Family Medicine

## 2013-03-02 NOTE — Telephone Encounter (Signed)
Call in #120 only, he needs testing and a contract

## 2013-03-15 ENCOUNTER — Ambulatory Visit (INDEPENDENT_AMBULATORY_CARE_PROVIDER_SITE_OTHER): Payer: Medicare Other | Admitting: Family Medicine

## 2013-03-15 ENCOUNTER — Encounter: Payer: Self-pay | Admitting: Family Medicine

## 2013-03-15 VITALS — BP 150/80 | HR 66 | Temp 98.1°F | Ht 71.5 in | Wt 238.0 lb

## 2013-03-15 DIAGNOSIS — K429 Umbilical hernia without obstruction or gangrene: Secondary | ICD-10-CM

## 2013-03-15 DIAGNOSIS — F411 Generalized anxiety disorder: Secondary | ICD-10-CM

## 2013-03-15 NOTE — Progress Notes (Signed)
   Subjective:    Patient ID: Benjamin [name], male    DOB: November 18, 1940, 73 y.o.   MRN: [id number]  HPI Here to ask my advice about his chronic depression and anxiety. He still travels a lot on his job and the job involves a lot of stress. He also has a lot of stress from taking car of his wife with Parkinsons disease. He has been on Lexapro and Xanax and Ambien for some time, but he feels as if he needs to take a new direction. He asks about seeing a psychiatrist. Also his umbilical hernia has gotten larger lately and it causes pain at times.    Review of Systems  Constitutional: Negative.   Gastrointestinal: Positive for abdominal pain. Negative for nausea, vomiting, diarrhea, constipation, blood in stool, abdominal distention, anal bleeding and rectal pain.  Psychiatric/Behavioral: Positive for dysphoric mood and decreased concentration. Negative for behavioral problems, confusion and agitation. The patient is nervous/anxious.        Objective:   Physical Exam  Constitutional: He appears well-developed and well-nourished.  Abdominal: Soft. Bowel sounds are normal. He exhibits no distension. There is no rebound and no guarding.  Moderate sized reducible tender umbilical hernia   Psychiatric: His behavior is normal. Thought content normal.  Very anxious           Assessment & Plan:  We will refer to Surgery about the hernia. I agreed with him seeing a psychiatrist and I gave some names of people in the area that could help him. I also encouraged him to start seeing a therapist regularly.

## 2013-03-15 NOTE — Progress Notes (Signed)
Pre visit review using our clinic review tool, if applicable. No additional management support is needed unless otherwise documented below in the visit note. 

## 2013-04-08 ENCOUNTER — Other Ambulatory Visit: Payer: Self-pay | Admitting: Family Medicine

## 2013-04-11 ENCOUNTER — Encounter: Payer: Self-pay | Admitting: Family Medicine

## 2013-04-11 ENCOUNTER — Other Ambulatory Visit: Payer: Self-pay | Admitting: Family Medicine

## 2013-04-12 NOTE — Telephone Encounter (Signed)
Call in Xanax #120 with 5 rf, Ambien #60 with 5 rf, and Naproxen #60 with 11 rf

## 2013-04-14 ENCOUNTER — Encounter (INDEPENDENT_AMBULATORY_CARE_PROVIDER_SITE_OTHER): Payer: Self-pay | Admitting: Surgery

## 2013-04-14 ENCOUNTER — Ambulatory Visit (INDEPENDENT_AMBULATORY_CARE_PROVIDER_SITE_OTHER): Payer: Medicare Other | Admitting: Surgery

## 2013-04-14 VITALS — BP 152/88 | HR 80 | Temp 98.6°F | Resp 12 | Ht 72.0 in | Wt 239.0 lb

## 2013-04-14 DIAGNOSIS — K429 Umbilical hernia without obstruction or gangrene: Secondary | ICD-10-CM

## 2013-04-14 NOTE — Progress Notes (Signed)
Benjamin [name] 73 y.o.  Body mass index is 32.41 kg/(m^2).  Patient Active Problem List   Diagnosis Date Noted  . Type II or unspecified type diabetes mellitus without mention of complication, not stated as uncontrolled 04/14/2011  . HYPOGONADISM 03/07/2010  . HEMATURIA UNSPECIFIED 01/08/2010  . KNEE PAIN, RIGHT 09/23/2009  . UMB HERNIA WITHOUT MENTION OBSTRUCTION/GANGRENE 08/16/2009  . ACUTE BRONCHITIS 06/24/2009  . 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 IRRITABLE BOWEL SYNDROME 09/03/2008  . MALAISE AND FATIGUE 08/17/2008  . ABDOMINAL BLOATING 08/17/2008  . FINGER SPRAIN 04/24/2008  . GERD 08/15/2007  . ANXIETY 02/16/2007  . BENIGN PROSTATIC HYPERTROPHY 02/16/2007  . 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 CHICKENPOX, HX OF 02/16/2007  . PROSTATE SPECIFIC ANTIGEN, ELEVATED 01/26/2007  . HYPERLIPIDEMIA 10/12/2006    No Known Allergies  Past Surgical History  Procedure Laterality Date  . Prostate biopsies in 2003 and again 05/2008      per Dr. Jolaine Click. benign  . Right knee arthroscopy    . Reapir of large umblicial hernia      per Dr. Kaylyn Lim on 01/10/10  . Colonoscopy      02/28/10 per Dr. Carlean Purl, single adenomatous polyp, repeat in 5 yrs.   . Liposuction  1987   FRY,STEPHEN A, MD No diagnosis found.  followup for [name] who had a laparoscopic umbilical hernia repair 50/53/9767. At that time he had a very large umbilical hernia and was repaired with a 4 x 6 piece of physeal mesh. He has a ventral eventration in his umbilical region is soft and you can feel the defect. In order be absolutely certain that this is a recurrent hernia we could possibly do a CT scan although it certainly feels like a recurrence. I can't feel whether the mesh was rolled up superiorly or inferiorly or has contracted. It is not bothering him. He has gained about 10-15 pounds it may be that that is call for making this more apparent. I told him I would just watch this. It's not  bothering him and he doesn't really want to have surgery. Cold see him back in 6 months and we can check this see if it's becoming more of a problem.  Apparent recurrent umbilical hernia. Weight gain. We'll observe. Recheck 6 months [name], MD, [facility] [phone] beeper [phone]  04/14/2013 2:37 PM  .

## 2013-04-14 NOTE — Patient Instructions (Signed)
Good Carbs:  Brocccoli, asparagus, spinach, mustard greens,    Intermediate:  Beans  Bad Carbs: Potatoes (white, sweet, or French Fries), carrots, squash,  

## 2013-05-03 ENCOUNTER — Ambulatory Visit (INDEPENDENT_AMBULATORY_CARE_PROVIDER_SITE_OTHER): Payer: Medicare Other | Admitting: Family Medicine

## 2013-05-03 ENCOUNTER — Encounter: Payer: Self-pay | Admitting: Family Medicine

## 2013-05-03 ENCOUNTER — Telehealth: Payer: Self-pay | Admitting: Family Medicine

## 2013-05-03 VITALS — BP 160/84 | HR 69 | Temp 98.5°F | Ht 72.0 in | Wt 240.0 lb

## 2013-05-03 DIAGNOSIS — M1710 Unilateral primary osteoarthritis, unspecified knee: Secondary | ICD-10-CM

## 2013-05-03 DIAGNOSIS — IMO0002 Reserved for concepts with insufficient information to code with codable children: Secondary | ICD-10-CM

## 2013-05-03 DIAGNOSIS — M1712 Unilateral primary osteoarthritis, left knee: Secondary | ICD-10-CM

## 2013-05-03 MED ORDER — TRAMADOL HCL 50 MG PO TABS: 50.0000 mg | ORAL_TABLET | Freq: Three times a day (TID) | ORAL | Status: DC | PRN

## 2013-05-03 NOTE — Progress Notes (Signed)
No chief complaint on file.   HPI:  Acute visit for knee pain: -L knee pain on and off for several years and wants to see a specialist now as wants to do surgery soon if possible -reports saw ortho in [place] in the past and told had bad OA on MRI and advised surgery -hx of meniscal surgery, hx of extensive skiing -reports pain in knees moderate-severe with stairs, clicking -denies: giving away, falls, weakness, or numbness  URI: -for a few days -nasal congestion, tickle in throat, cough -no fever of SOB or wheezing  ROS: See pertinent positives and negatives per HPI.  Past Medical History  Diagnosis Date  . Hyperlipidemia   . BPH (benign prostatic hyperplasia)     with elevated PSA, sees Dr. Diona Fanti, benign  . Anxiety   . Insomnia   . GERD (gastroesophageal reflux disease)   . Diabetes mellitus   . Elevated PSA     Past Surgical History  Procedure Laterality Date  . Prostate biopsies in 2003 and again 05/2008      per Dr. Jolaine Click. benign  . Right knee arthroscopy    . Reapir of large umblicial hernia      per Dr. Kaylyn Lim on 01/10/10  . Colonoscopy      02/28/10 per Dr. Carlean Purl, single adenomatous polyp, repeat in 5 yrs.   . Liposuction  1987    Family History  Problem Relation Age of Onset  . Alzheimer's disease Other     History   Social History  . Marital Status: Married    Spouse Name: N/A    Number of Children: N/A  . Years of Education: N/A   Social History Main Topics  . Smoking status: Former Smoker    Quit date: 04/14/1973  . Smokeless tobacco: Never Used  . Alcohol Use: 3.5 oz/week    7 drink(s) per week     Comment: daily  . Drug Use: Yes    Special: Marijuana  . Sexual Activity: None   Other Topics Concern  . None   Social History Narrative  . None    Current outpatient prescriptions:ALPRAZolam (XANAX) 1 MG tablet, TAKE ONE TABLET EVERY 6 HOURS AS NEEDED, Disp: 120 tablet, Rfl: 5;  naproxen (NAPROSYN) 500 MG tablet, TAKE  ONE TABLET BY MOUth once a day, Disp: , Rfl: ;  omeprazole (PRILOSEC) 40 MG capsule, Take 40 mg by mouth daily. , Disp: , Rfl: ;  zolpidem (AMBIEN) 10 MG tablet, TAKE one to two tablets at night if needed, Disp: , Rfl:  traMADol (ULTRAM) 50 MG tablet, Take 1 tablet (50 mg total) by mouth every 8 (eight) hours as needed., Disp: 30 tablet, Rfl: 0  EXAM:  Filed Vitals:   05/03/13 0928  BP: 160/84  Pulse: 69  Temp: 98.5 F (36.9 C)    Body mass index is 32.54 kg/(m^2).  GENERAL: vitals reviewed and listed above, alert, oriented, appears well hydrated and in no acute distress  HEENT: atraumatic, conjunttiva clear, no obvious abnormalities on inspection of external nose and ears  NECK: no obvious masses on inspection  LUNGS: clear to auscultation bilaterally, no wheezes, rales or rhonchi, good air movement  CV: HRRR, no peripheral edema  MS: moves all extremities without noticeable abnormality  PSYCH: pleasant and cooperative, no obvious depression or anxiety  ASSESSMENT AND PLAN:  Discussed the following assessment and plan:  Osteoarthritis of left knee - Plan: Ambulatory referral to Orthopedic Surgery, traMADol (ULTRAM) 50 MG tablet  -discussed  options -he wishes to see ortho  -referral placed and ultram provided for bad days, risks discussed Supportive care for likely URI -Patient advised to return or notify a doctor immediately if symptoms worsen or persist or new concerns arise.  Patient Instructions  -We placed a referral for you as discussed. It usually takes about 1-2 weeks to process and schedule this referral. If you have not heard from [place] regarding this appointment in 2 weeks please contact our office.  INSTRUCTIONS FOR UPPER RESPIRATORY INFECTION:  -plenty of rest and fluids  -nasal saline wash 2-3 times daily (use prepackaged nasal saline or bottled/distilled water if making your own)   -clean nose with nasal saline before using the nasal steroid or  sinex  -can use sinex nasal spray for drainage and nasal congestion - but do NOT use longer then 3-4 days  -can use tylenol or ibuprofen as directed for aches and sorethroat  -in the winter time, using a humidifier at night is helpful (please follow cleaning instructions)  -if you are taking a cough medication - use only as directed, may also try a teaspoon of honey to coat the throat and throat lozenges  -for sore throat, salt water gargles can help  -follow up if you have fevers, facial pain, tooth pain, difficulty breathing or are worsening or not getting better in 5-7 days      KIM, HANNAH R.

## 2013-05-03 NOTE — Progress Notes (Signed)
Pre visit review using our clinic review tool, if applicable. No additional management support is needed unless otherwise documented below in the visit note. 

## 2013-05-03 NOTE — Telephone Encounter (Signed)
Pt following up on request for ortho referral. Pt states this should be a stat order due to pain and travel plans for work. Pt would like appt this week. pls advise

## 2013-05-03 NOTE — Patient Instructions (Addendum)
-  We placed a referral for you as discussed. It usually takes about 1-2 weeks to process and schedule this referral. If you have not heard from [place] regarding this appointment in 2 weeks please contact our office.  INSTRUCTIONS FOR UPPER RESPIRATORY INFECTION:  -plenty of rest and fluids  -nasal saline wash 2-3 times daily (use prepackaged nasal saline or bottled/distilled water if making your own)   -clean nose with nasal saline before using the nasal steroid or sinex  -can use sinex nasal spray for drainage and nasal congestion - but do NOT use longer then 3-4 days  -can use tylenol or ibuprofen as directed for aches and sorethroat  -in the winter time, using a humidifier at night is helpful (please follow cleaning instructions)  -if you are taking a cough medication - use only as directed, may also try a teaspoon of honey to coat the throat and throat lozenges  -for sore throat, salt water gargles can help  -follow up if you have fevers, facial pain, tooth pain, difficulty breathing or are worsening or not getting better in 5-7 days

## 2013-05-04 NOTE — Telephone Encounter (Signed)
Sent referral to [facility], they will contact pt directly to schedule. [place], [place], [phone].

## 2013-05-05 ENCOUNTER — Encounter: Payer: Medicare Other | Admitting: Family Medicine

## 2013-05-09 ENCOUNTER — Encounter: Payer: Self-pay | Admitting: Family Medicine

## 2013-05-09 ENCOUNTER — Ambulatory Visit (INDEPENDENT_AMBULATORY_CARE_PROVIDER_SITE_OTHER): Payer: Medicare Other | Admitting: Family Medicine

## 2013-05-09 VITALS — BP 170/90 | HR 73 | Temp 98.5°F | Ht 72.0 in | Wt 235.0 lb

## 2013-05-09 DIAGNOSIS — M25562 Pain in left knee: Secondary | ICD-10-CM

## 2013-05-09 DIAGNOSIS — M25569 Pain in unspecified knee: Secondary | ICD-10-CM

## 2013-05-09 DIAGNOSIS — J209 Acute bronchitis, unspecified: Secondary | ICD-10-CM

## 2013-05-09 MED ORDER — CLARITHROMYCIN 500 MG PO TABS: 500.0000 mg | ORAL_TABLET | Freq: Two times a day (BID) | ORAL | Status: DC

## 2013-05-09 NOTE — Progress Notes (Signed)
Pre visit review using our clinic review tool, if applicable. No additional management support is needed unless otherwise documented below in the visit note. 

## 2013-05-09 NOTE — Progress Notes (Signed)
   Subjective:    Patient ID: Benjamin [name], male    DOB: 1940-05-04, 73 y.o.   MRN: [id number]  HPI Here for 2 things. First he has been coughing up green sputum for the past week. His chest is tight and he feels mildly SOB. No fever. His wife is being treated for pneumonia. Also he has had left knee pain for the past year but this is getting worse. He recently saw someone at [facility] and they said he has severe osteoarthritis in the knee. They were very hesitant to do any surgery on this and they gave him a steroid injection. This has not helped. He takes Aleve or Advil for it. He asks to see someone for a second opinion.    Review of Systems  Constitutional: Negative.   HENT: Negative.   Eyes: Negative.   Respiratory: Positive for cough, chest tightness and shortness of breath.   Cardiovascular: Negative.        Objective:   Physical Exam  Constitutional: He appears well-developed and well-nourished.  HENT:  Right Ear: External ear normal.  Left Ear: External ear normal.  Nose: Nose normal.  Mouth/Throat: Oropharynx is clear and moist.  Eyes: Conjunctivae are normal.  Pulmonary/Chest: Effort normal. No respiratory distress. He has no rales.  Scattered rhonchi   Lymphadenopathy:    He has no cervical adenopathy.          Assessment & Plan:  Treat with Biaxin and Mucinex. Refer to Dr. Onnie Graham for the knee.

## 2013-05-19 ENCOUNTER — Other Ambulatory Visit: Payer: Medicare Other

## 2013-05-26 ENCOUNTER — Encounter: Payer: Medicare Other | Admitting: Family Medicine

## 2013-06-28 ENCOUNTER — Telehealth: Payer: Self-pay | Admitting: Family Medicine

## 2013-06-28 DIAGNOSIS — M1712 Unilateral primary osteoarthritis, left knee: Secondary | ICD-10-CM

## 2013-06-28 NOTE — Telephone Encounter (Signed)
Call in #60 with 5 rf 

## 2013-06-28 NOTE — Telephone Encounter (Signed)
[place], [address] is requesting re-fill on traMADol (ULTRAM) 50 MG tablet

## 2013-06-29 MED ORDER — TRAMADOL HCL 50 MG PO TABS: 50.0000 mg | ORAL_TABLET | Freq: Three times a day (TID) | ORAL | Status: DC | PRN

## 2013-06-29 NOTE — Telephone Encounter (Signed)
I called in script 

## 2013-07-04 ENCOUNTER — Other Ambulatory Visit (INDEPENDENT_AMBULATORY_CARE_PROVIDER_SITE_OTHER): Payer: Medicare Other

## 2013-07-04 DIAGNOSIS — E785 Hyperlipidemia, unspecified: Secondary | ICD-10-CM

## 2013-07-04 DIAGNOSIS — Z0000 Encounter for general adult medical examination without abnormal findings: Secondary | ICD-10-CM

## 2013-07-04 LAB — CBC WITH DIFFERENTIAL/PLATELET
Basophils Absolute: 0 10*3/uL (ref 0.0–0.1)
Basophils Relative: 0.5 % (ref 0.0–3.0)
EOS ABS: 0.1 10*3/uL (ref 0.0–0.7)
Eosinophils Relative: 3.3 % (ref 0.0–5.0)
HCT: 45.3 % (ref 39.0–52.0)
Hemoglobin: 15.3 g/dL (ref 13.0–17.0)
LYMPHS PCT: 32.4 % (ref 12.0–46.0)
Lymphs Abs: 1.2 10*3/uL (ref 0.7–4.0)
MCHC: 33.7 g/dL (ref 30.0–36.0)
MCV: 92.7 fl (ref 78.0–100.0)
Monocytes Absolute: 0.4 10*3/uL (ref 0.1–1.0)
Monocytes Relative: 11.4 % (ref 3.0–12.0)
NEUTROS ABS: 2 10*3/uL (ref 1.4–7.7)
NEUTROS PCT: 52.4 % (ref 43.0–77.0)
PLATELETS: 120 10*3/uL — AB (ref 150.0–400.0)
RBC: 4.89 Mil/uL (ref 4.22–5.81)
RDW: 13.9 % (ref 11.5–15.5)
WBC: 3.8 10*3/uL — ABNORMAL LOW (ref 4.0–10.5)

## 2013-07-04 LAB — HEPATIC FUNCTION PANEL
ALBUMIN: 3.8 g/dL (ref 3.5–5.2)
ALT: 34 U/L (ref 0–53)
AST: 24 U/L (ref 0–37)
Alkaline Phosphatase: 55 U/L (ref 39–117)
BILIRUBIN DIRECT: 0.2 mg/dL (ref 0.0–0.3)
Total Bilirubin: 1 mg/dL (ref 0.2–1.2)
Total Protein: 5.9 g/dL — ABNORMAL LOW (ref 6.0–8.3)

## 2013-07-04 LAB — BASIC METABOLIC PANEL
BUN: 14 mg/dL (ref 6–23)
CALCIUM: 8.9 mg/dL (ref 8.4–10.5)
CO2: 28 meq/L (ref 19–32)
CREATININE: 0.9 mg/dL (ref 0.4–1.5)
Chloride: 105 mEq/L (ref 96–112)
GFR: 84.8 mL/min (ref >60.00)
GLUCOSE: 107 mg/dL — AB (ref 70–99)
Potassium: 4.1 mEq/L (ref 3.5–5.1)
SODIUM: 140 meq/L (ref 135–145)

## 2013-07-04 LAB — TSH: TSH: 5.36 u[IU]/mL — ABNORMAL HIGH (ref 0.35–4.50)

## 2013-07-04 LAB — POCT URINALYSIS DIPSTICK
BILIRUBIN UA: NEGATIVE
Blood, UA: NEGATIVE
Glucose, UA: NEGATIVE
Ketones, UA: NEGATIVE
Leukocytes, UA: NEGATIVE
NITRITE UA: NEGATIVE
PH UA: 6
PROTEIN UA: NEGATIVE
Spec Grav, UA: 1.015
Urobilinogen, UA: 0.2

## 2013-07-04 LAB — LIPID PANEL
CHOL/HDL RATIO: 6
Cholesterol: 245 mg/dL — ABNORMAL HIGH (ref 0–200)
HDL: 43.3 mg/dL (ref >39.00)
LDL Cholesterol: 141 mg/dL — ABNORMAL HIGH (ref 0–99)
Triglycerides: 306 mg/dL — ABNORMAL HIGH (ref 0.0–149.0)
VLDL: 61.2 mg/dL — ABNORMAL HIGH (ref 0.0–40.0)

## 2013-07-04 LAB — PSA: PSA: 16.13 ng/mL — ABNORMAL HIGH (ref 0.10–4.00)

## 2013-07-10 ENCOUNTER — Encounter: Payer: Self-pay | Admitting: Family Medicine

## 2013-07-10 ENCOUNTER — Ambulatory Visit (INDEPENDENT_AMBULATORY_CARE_PROVIDER_SITE_OTHER): Payer: Medicare Other | Admitting: Family Medicine

## 2013-07-10 VITALS — BP 152/85 | HR 66 | Temp 98.1°F | Ht 72.0 in | Wt 240.0 lb

## 2013-07-10 DIAGNOSIS — E039 Hypothyroidism, unspecified: Secondary | ICD-10-CM

## 2013-07-10 DIAGNOSIS — E785 Hyperlipidemia, unspecified: Secondary | ICD-10-CM

## 2013-07-10 DIAGNOSIS — Z0000 Encounter for general adult medical examination without abnormal findings: Secondary | ICD-10-CM

## 2013-07-10 MED ORDER — OMEPRAZOLE 40 MG PO CPDR: 40.0000 mg | DELAYED_RELEASE_CAPSULE | Freq: Every day | ORAL | Status: DC

## 2013-07-10 MED ORDER — ALPRAZOLAM 1 MG PO TABS: ORAL_TABLET | ORAL | Status: DC

## 2013-07-10 MED ORDER — LEVOTHYROXINE SODIUM 100 MCG PO TABS
100.0000 ug | ORAL_TABLET | Freq: Every day | ORAL | Status: DC
Start: 2013-07-10 — End: 2013-09-29

## 2013-07-10 MED ORDER — NAPROXEN 500 MG PO TABS: ORAL_TABLET | ORAL | Status: DC

## 2013-07-10 NOTE — Progress Notes (Signed)
Pre visit review using our clinic review tool, if applicable. No additional management support is needed unless otherwise documented below in the visit note. 

## 2013-07-10 NOTE — Progress Notes (Signed)
   Subjective:    Patient ID: Benjamin [name], male    DOB: Aug 16, 1940, 73 y.o.   MRN: [id number]  HPI 73 yr old male for a cpx. He complains of daily fatigue and some weight gain in the past year even though his diet has not changed.    Review of Systems  Constitutional: Positive for fatigue and unexpected weight change. Negative for fever, chills, diaphoresis, activity change and appetite change.  HENT: Negative.   Eyes: Negative.   Respiratory: Negative.   Cardiovascular: Negative.   Gastrointestinal: Negative.   Genitourinary: Negative.   Musculoskeletal: Negative.   Skin: Negative.   Neurological: Negative.   Psychiatric/Behavioral: Negative.        Objective:   Physical Exam  Constitutional: He is oriented to person, place, and time. He appears well-developed and well-nourished. No distress.  HENT:  Head: Normocephalic and atraumatic.  Right Ear: External ear normal.  Left Ear: External ear normal.  Nose: Nose normal.  Mouth/Throat: Oropharynx is clear and moist. No oropharyngeal exudate.  Eyes: Conjunctivae and EOM are normal. Pupils are equal, round, and reactive to light. Right eye exhibits no discharge. Left eye exhibits no discharge. No scleral icterus.  Neck: Neck supple. No JVD present. No tracheal deviation present. No thyromegaly present.  Cardiovascular: Normal rate, regular rhythm, normal heart sounds and intact distal pulses.  Exam reveals no gallop and no friction rub.   No murmur heard. 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 No respiratory distress. He has no wheezes. He has no rales. He exhibits no tenderness.  Abdominal: Soft. Bowel sounds are normal. He exhibits no distension and no mass. There is no tenderness. There is no rebound and no guarding.  Genitourinary: Rectum normal, prostate normal and penis normal. Guaiac negative stool. No penile tenderness.  Musculoskeletal: Normal range of motion. He  exhibits no edema and no tenderness.  Lymphadenopathy:    He has no cervical adenopathy.  Neurological: He is alert and oriented to person, place, and time. He has normal reflexes. No cranial nerve deficit. He exhibits normal muscle tone. Coordination normal.  Skin: Skin is warm and dry. No rash noted. He is not diaphoretic. No erythema. No pallor.  Psychiatric: He has a normal mood and affect. His behavior is normal. Judgment and thought content normal.          Assessment & Plan:  Well exam. His hypothyroidism can certainly explain his fatigue and weight gain. Start on Synthroid 100 mcg daily and recheck in 90 days

## 2013-08-18 ENCOUNTER — Ambulatory Visit (INDEPENDENT_AMBULATORY_CARE_PROVIDER_SITE_OTHER): Payer: Medicare Other | Admitting: Family Medicine

## 2013-08-18 ENCOUNTER — Telehealth: Payer: Self-pay | Admitting: Family Medicine

## 2013-08-18 ENCOUNTER — Encounter: Payer: Self-pay | Admitting: Family Medicine

## 2013-08-18 VITALS — BP 160/82 | HR 84 | Temp 98.4°F | Resp 20 | Ht 72.0 in | Wt 237.0 lb

## 2013-08-18 DIAGNOSIS — R1011 Right upper quadrant pain: Secondary | ICD-10-CM

## 2013-08-18 NOTE — Progress Notes (Signed)
   Subjective:    Patient ID: Benjamin [name], male    DOB: 05/31/1940, 73 y.o.   MRN: [id number]  HPI Here for intermittent sharp pains in the RUQ and right flank for the past 6 months. These pains have been more severe and more frequent in the past 2 weeks. He relates a meal last week where he ate a steak and about an hour later the pain started and lasted several hours. He gets nauseated with the pains but has not vomited. He has had some loose stools and frank diarrhea at times. No fever. No urinary sx. Sometimes his solid stools are light tan or chalky in color.    Review of Systems  Constitutional: Negative.   Respiratory: Negative.   Cardiovascular: Negative.   Gastrointestinal: Positive for nausea, abdominal pain and diarrhea. Negative for vomiting, constipation, blood in stool, abdominal distention, anal bleeding and rectal pain.       Objective:   Physical Exam  Constitutional: He appears well-developed and well-nourished.  Cardiovascular: Normal rate, regular rhythm, normal heart sounds and intact distal pulses.   Pulmonary/Chest: Effort normal and breath sounds normal.  Abdominal: Soft. Bowel sounds are normal. He exhibits no distension and no mass. There is no tenderness. There is no rebound and no guarding.          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 He is to eat frequent but small meals and to avoid fatty foods. Set up an [place] for next week.

## 2013-08-18 NOTE — Progress Notes (Signed)
Pre visit review using our clinic review tool, if applicable. No additional management support is needed unless otherwise documented below in the visit note. 

## 2013-08-18 NOTE — Telephone Encounter (Signed)
lmom for pt to cb. Pt needs diabetic bundle a1c,ldl and follow up with md around sept 15

## 2013-08-23 ENCOUNTER — Ambulatory Visit
Admission: RE | Admit: 2013-08-23 | Discharge: 2013-08-23 | Disposition: A | Discharge status: Home / Self Care | Payer: Medicare Other | Source: Ambulatory Visit | Attending: Family Medicine | Admitting: Family Medicine

## 2013-08-23 DIAGNOSIS — R1011 Right upper quadrant pain: Secondary | ICD-10-CM

## 2013-08-25 NOTE — Addendum Note (Signed)
Addended by: Alysia Penna A on: 08/25/2013 04:48 PM   Modules accepted: Orders

## 2013-09-22 NOTE — Telephone Encounter (Signed)
Will call pt back on 09-25-13

## 2013-09-26 NOTE — Telephone Encounter (Signed)
Pt will cb to sch °

## 2013-09-28 ENCOUNTER — Encounter (INDEPENDENT_AMBULATORY_CARE_PROVIDER_SITE_OTHER): Payer: Medicare Other | Admitting: Surgery

## 2013-09-29 ENCOUNTER — Ambulatory Visit (INDEPENDENT_AMBULATORY_CARE_PROVIDER_SITE_OTHER): Payer: Medicare Other | Admitting: Surgery

## 2013-09-29 VITALS — BP 140/72 | HR 63 | Temp 97.6°F | Ht 72.0 in | Wt 230.2 lb

## 2013-09-29 DIAGNOSIS — K8020 Calculus of gallbladder without cholecystitis without obstruction: Secondary | ICD-10-CM

## 2013-09-29 NOTE — Progress Notes (Signed)
Chief Complaint:  Episodes of epigastric pain. Ultrasound showing multiple gallstones with one of the neck of the gallbladder.  History of Present Illness:  Benjamin [name] is an 73 y.o. male on him previously done an umbilical hernia repair with Physio mesh.  This is a little more prominent would like to see now. He has however had several bouts of epigastric pain and Dr. Velna Hatchet has obtained an ultrasound which showed multiple gallstones one of which was 9 mm and lodged in the cystic duct.  I have discussed  laparoscopic cholecystectomy to him in some detail including complications not limited to common duct injury or bile leaks. 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   Past Medical History  Diagnosis Date  . Hyperlipidemia   . BPH (benign prostatic hyperplasia)     with elevated PSA, sees Dr. Diona Fanti, benign  . Anxiety   . Insomnia   . GERD (gastroesophageal reflux disease)   . Diabetes mellitus   . Elevated PSA     Past Surgical History  Procedure Laterality Date  . Prostate biopsies in 2003 and again 05/2008      per Dr. Diona Fanti. benign  . Right knee arthroscopy    . Reapir of large umblicial hernia      per Dr. Kaylyn Lim on 01/10/10  . Colonoscopy      02/28/10 per Dr. Carlean Purl, single adenomatous polyp, repeat in 5 yrs.   . Liposuction  1987    Current Outpatient Prescriptions  Medication Sig Dispense Refill  . ALPRAZolam (XANAX) 1 MG tablet TAKE ONE TABLET EVERY 6 HOURS AS NEEDED  120 tablet  5  . omeprazole (PRILOSEC) 40 MG capsule Take 1 capsule (40 mg total) by mouth daily.  90 capsule  3   No current facility-administered medications for this visit.   Review of patient's allergies indicates no known allergies. Family History  Problem Relation Age of Onset  . Alzheimer's disease Other    Social History:   reports that he quit smoking about 40 years ago. He has never used smokeless tobacco. He reports that he drinks about 3.5 ounces of alcohol  per week. He reports that he uses illicit drugs (Marijuana).   REVIEW OF SYSTEMS : Negative except for BPH.  Physical Exam:   Blood pressure 140/72, pulse 63, temperature 97.6 F (36.4 C), temperature source Oral, height 6' (1.829 m), weight 230 lb 4 oz (104.441 kg). Body mass index is 31.22 kg/(m^2).  Gen:  WDWN white male NAD  Neurological: Alert and oriented to person, place, and time. Motor and sensory function is grossly intact  Head: Normocephalic and atraumatic.  Eyes: Conjunctivae are normal. Pupils are equal, round, and reactive to light. No scleral icterus.  Neck: Normal range of motion. Neck supple. No tracheal deviation or thyromegaly present.  Cardiovascular:  SR without murmurs or gallops.  No carotid bruits Breast:  Not examined Respiratory: Effort normal.  No respiratory distress. No chest wall tenderness. Breath sounds normal.  No wheezes, rales or rhonchi.  Abdomen:  Prominent broad soft area in the previous hernia at may be mesh beneath the umbilical skin. GU:  unremarkable Musculoskeletal: Normal range of motion. Extremities are nontender. No cyanosis, edema or clubbing noted Lymphadenopathy: No cervical, preauricular, postauricular or axillary adenopathy is present Skin: Skin is warm and dry. No rash noted. No diaphoresis. No erythema. No pallor. Pscyh: Normal mood and affect. Behavior is normal. Judgment and thought content normal.   LABORATORY RESULTS: No  results found for this or any previous visit (from the past 5 hour(s)).   RADIOLOGY RESULTS: No results found.  Problem List: Patient Active Problem List   Diagnosis Date Noted  . Unspecified hypothyroidism 07/10/2013  . Type II or unspecified type diabetes mellitus without mention of complication, not stated as uncontrolled 04/14/2011  . HYPOGONADISM 03/07/2010  . HEMATURIA UNSPECIFIED 01/08/2010  . KNEE PAIN, RIGHT 09/23/2009  . UMB HERNIA WITHOUT MENTION OBSTRUCTION/GANGRENE 08/16/2009  . ACUTE  BRONCHITIS 06/24/2009  . 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 IRRITABLE BOWEL SYNDROME 09/03/2008  . MALAISE AND FATIGUE 08/17/2008  . ABDOMINAL BLOATING 08/17/2008  . FINGER SPRAIN 04/24/2008  . GERD 08/15/2007  . ANXIETY 02/16/2007  . BENIGN PROSTATIC HYPERTROPHY 02/16/2007  . 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 CHICKENPOX, HX OF 02/16/2007  . PROSTATE SPECIFIC ANTIGEN, ELEVATED 01/26/2007  . HYPERLIPIDEMIA 10/12/2006    Assessment & Plan: Symptoms and history compatible with biliary colic and gallstones by ultrasound. We'll recommend laparoscopic cholecystectomy with IOC.    [name], MD, [facility] [phone] beeper [phone]  09/29/2013 3:58 PM

## 2013-09-29 NOTE — Patient Instructions (Signed)

## 2013-11-06 ENCOUNTER — Ambulatory Visit (INDEPENDENT_AMBULATORY_CARE_PROVIDER_SITE_OTHER): Payer: Medicare Other | Admitting: Family Medicine

## 2013-11-06 ENCOUNTER — Encounter: Payer: Self-pay | Admitting: Family Medicine

## 2013-11-06 VITALS — BP 144/83 | HR 75 | Temp 98.8°F | Ht 72.0 in | Wt 222.0 lb

## 2013-11-06 DIAGNOSIS — R634 Abnormal weight loss: Secondary | ICD-10-CM

## 2013-11-06 DIAGNOSIS — K219 Gastro-esophageal reflux disease without esophagitis: Secondary | ICD-10-CM

## 2013-11-06 DIAGNOSIS — F411 Generalized anxiety disorder: Secondary | ICD-10-CM

## 2013-11-06 DIAGNOSIS — R195 Other fecal abnormalities: Secondary | ICD-10-CM

## 2013-11-06 DIAGNOSIS — Z23 Encounter for immunization: Secondary | ICD-10-CM

## 2013-11-06 MED ORDER — OMEPRAZOLE 40 MG PO CPDR: 40.0000 mg | DELAYED_RELEASE_CAPSULE | Freq: Two times a day (BID) | ORAL | Status: DC

## 2013-11-06 MED ORDER — ALPRAZOLAM 2 MG PO TABS: 2.0000 mg | ORAL_TABLET | Freq: Four times a day (QID) | ORAL | Status: DC | PRN

## 2013-11-06 MED ORDER — ZOLPIDEM TARTRATE 10 MG PO TABS: 10.0000 mg | ORAL_TABLET | Freq: Every evening | ORAL | Status: DC | PRN

## 2013-11-06 NOTE — Progress Notes (Signed)
Pre visit review using our clinic review tool, if applicable. No additional management support is needed unless otherwise documented below in the visit note. 

## 2013-11-06 NOTE — Progress Notes (Signed)
   Subjective:    Patient ID: Benjamin [name], male    DOB: March 03, 1940, 73 y.o.   MRN: [id number]  HPI Here for several issues. First he has been under more stress than usual between his job and helping his wife who has been diagnosed with multiple sclerosis. He is taking Xanax 1 mg qid and it does not help like it used to. He feels stressed and nervous most of the time and he has trouble sleeping. Also he has been having a lot of epigastric pain, and in fact has recently been found to have gallstones. He is scheduled for surgery soon. He still takes Omeprazole 40 mg once a day. He relates seeing some dark stools recently and has lost about 30 lbs of weight in the past 6 months without trying. He had a colonoscopy 3 years ago.    Review of Systems  Constitutional: Positive for unexpected weight change. Negative for activity change and appetite change.  Respiratory: Negative.   Cardiovascular: Negative.   Gastrointestinal: Positive for abdominal pain. Negative for nausea, vomiting, diarrhea, constipation, abdominal distention, anal bleeding and rectal pain.  Psychiatric/Behavioral: Positive for dysphoric mood. Negative for behavioral problems, confusion and agitation. The patient is nervous/anxious.        Objective:   Physical Exam  Constitutional: He appears well-developed and well-nourished.  Cardiovascular: Normal rate, regular rhythm, normal heart sounds and intact distal pulses.   Pulmonary/Chest: Effort normal and breath sounds normal.  Abdominal: Soft. Bowel sounds are normal. He exhibits no distension and no mass. There is no tenderness. There is no rebound and no guarding.  Psychiatric: He has a normal mood and affect. His behavior is normal. Thought content normal.          Assessment & Plan:  He seems to have IBS, GERD, and gall bladder disease going on simultaneously. He may even have an ulcer at this point. We will increase the Omeprazole to 40 mg bid and refer him back to Dr.  Carlean Purl to consider possible upper endoscopy. He will proceed with a cholecystectomy as above. Increase the Xanax to 2 mg every 6 hours prn.

## 2013-11-14 ENCOUNTER — Telehealth: Payer: Self-pay | Admitting: Internal Medicine

## 2013-11-14 ENCOUNTER — Encounter: Payer: Self-pay | Admitting: Internal Medicine

## 2013-11-14 NOTE — Telephone Encounter (Signed)
Patient is offered an appt with an APP prior to office visit in December he declines.  He is not asking for any medications at present .  He wants to know if omeprazole is safe to take.  He will call back if he changes his mind and would like to be seen earlier

## 2013-12-25 ENCOUNTER — Telehealth: Payer: Self-pay | Admitting: Family Medicine

## 2013-12-25 DIAGNOSIS — M25562 Pain in left knee: Secondary | ICD-10-CM

## 2013-12-25 NOTE — Telephone Encounter (Signed)
Pt states he needs referral for left knee pain ASAP.  Pt is having trouble getting up and states he thinks it needs to be replaced at this point. Pt was referred to [facility] in the past, dr Lennette Bihari supple and would like to return.

## 2013-12-26 NOTE — Telephone Encounter (Signed)
Referral was done  

## 2013-12-26 NOTE — Telephone Encounter (Signed)
I spoke with pt  

## 2013-12-29 NOTE — Patient Instructions (Addendum)
20     Your procedure is scheduled on:  Wednesday 01/10/2014  Report to [facility] Main Entrance and follow signs to Short Stay  at  0630 AM.  Call this number if you have problems the morning of surgery: [phone]                    Remember:          Do not eat food or drink liquids AFTER MIDNIGHT!  Take these medicines the morning of surgery with A SIP OF WATER: Prilosec    Do not bring valuables to the Hospital! [place].  [place]oud suitcase in the car. After surgery it may be brought to your room.     DO NOT WEAR  JEWELRY,MAKE-UP,LOTIONS,POWDERS,PERFUMES,CONTACTS , DENTURES, BRIDGEWORK AND DO NOT WEAR FALSE EYELASHES  !                                  Patients discharged the day of surgery will not be allowed to drive home.  If going home the same day of surgery, must have someone stay with you   first 24 hrs.at home and arrange for someone to drive you home from the Hospital.                           YOUR DRIVER IW:[id number]   Special Instructions:              Please read over the following fact sheets that you were given:             1. [place] - Preparing for Surgery Before surgery, you can play an important role.  Because skin is not sterile, your skin needs to be as free of germs as possible.  You can reduce the number of germs on your skin by washing with CHG (chlorahexidine gluconate) soap before surgery.  CHG is an antiseptic cleaner which kills germs and bonds with the skin to continue killing germs even after washing. Please DO NOT use if you have an allergy to CHG or antibacterial soaps.  If your skin becomes reddened/irritated stop using the CHG and inform your nurse when you arrive at Short Stay. Do not shave (including legs and underarms) for at least 48 hours prior to the  first CHG shower.  You may shave your face/neck. Please follow these instructions carefully:  1.  Shower with CHG Soap the night before surgery and the  morning of Surgery.  2.  If you choose to wash your hair, wash your hair first as usual with your  normal  shampoo.  3.  After you shampoo, rinse your hair and body thoroughly to remove the  shampoo.                           4.  Use CHG as you would  any other liquid soap.  You can apply chg directly  to the skin and wash                       Gently with a scrungie or clean washcloth.  5.  Apply the CHG Soap to your body ONLY FROM THE NECK DOWN.   Do not use on face/ open                           Wound or open sores. Avoid contact with eyes, ears mouth and genitals (private parts).                       Wash face,  Genitals (private parts) with your normal soap.             6.  Wash thoroughly, paying special attention to the area where your surgery  will be performed.  7.  Thoroughly rinse your body with warm water from the neck down.  8.  DO NOT shower/wash with your normal soap after using and rinsing off  the CHG Soap.                9.  Pat yourself dry with a clean towel.            10.  Wear clean pajamas.            11.  Place clean sheets on your bed the night of your first shower and do not  sleep with pets. Day of Surgery : Do not apply any lotions/deodorants the morning of surgery.  Please wear clean clothes to the hospital/surgery center.  FAILURE TO FOLLOW THESE INSTRUCTIONS MAY RESULT IN THE CANCELLATION OF YOUR SURGERY PATIENT SIGNATURE_________________________________  NURSE SIGNATURE__________________________________  ________________________________________________________________________   Adam Phenix  An incentive spirometer is a tool that can help keep your lungs clear and active. This tool measures how well you are filling your lungs with each breath. Taking long deep breaths may help reverse or decrease  the chance of developing breathing (pulmonary) problems (especially infection) following:  A long period of time when you are unable to move or be active. BEFORE THE PROCEDURE   If the spirometer includes an indicator to show your best effort, your nurse or respiratory therapist will set it to a desired goal.  If possible, sit up straight or lean slightly forward. Try not to slouch.  Hold the incentive spirometer in an upright position. INSTRUCTIONS FOR USE  1. Sit on the edge of your bed if possible, or sit up as far as you can in bed or on a chair. 2. Hold the incentive spirometer in an upright position. 3. Breathe out normally. 4. Place the mouthpiece in your mouth and seal your lips tightly around it. 5. Breathe in slowly and as deeply as possible, raising the piston or the ball toward the top of the column. 6. Hold your breath for 3-5 seconds or for as long as possible. Allow the piston or ball to fall to the bottom of the column. 7. Remove the mouthpiece from your mouth and breathe out normally. 8. Rest for a few seconds and repeat Steps 1 through 7 at least 10 times every 1-2 hours when you are awake. Take your time and take a few normal breaths between deep breaths. 9. The spirometer may include an indicator to show your best effort. Use the indicator as  a goal to work toward during each repetition. 10. After each set of 10 deep breaths, practice coughing to be sure your lungs are clear. If you have an incision (the cut made at the time of surgery), support your incision when coughing by placing a pillow or rolled up towels firmly against it. Once you are able to get out of bed, walk around indoors and cough well. You may stop using the incentive spirometer when instructed by your caregiver.  RISKS AND COMPLICATIONS  Take your time so you do not get dizzy or light-headed.  If you are in pain, you may need to take or ask for pain medication before doing incentive spirometry. It is  harder to take a deep breath if you are having pain. AFTER USE  Rest and breathe slowly and easily.  It can be helpful to keep track of a log of your progress. Your caregiver can provide you with a simple table to help with this. If you are using the spirometer at home, follow these instructions: [place] IF:   You are having difficultly using the spirometer.  You have trouble using the spirometer as often as instructed.  Your pain medication is not giving enough relief while using the spirometer.  You develop fever of 100.5 F (38.1 C) or higher. SEEK IMMEDIATE MEDICAL CARE IF:   You cough up bloody sputum that had not been present before.  You develop fever of 102 F (38.9 C) or greater.  You develop worsening pain at or near the incision site. MAKE SURE YOU:   Understand these instructions.  Will watch your condition.  Will get help right away if you are not doing well or get worse. Document Released: 06/08/2006 Document Revised: 04/20/2011 Document Reviewed: 08/09/2006 [facility] Patient Information 2014 [place], [place].   ________________________________________________________________________

## 2014-01-01 ENCOUNTER — Encounter (HOSPITAL_COMMUNITY)
Admission: RE | Admit: 2014-01-01 | Discharge: 2014-01-01 | Disposition: A | Discharge status: Home / Self Care | Payer: Medicare Other | Source: Ambulatory Visit | Attending: Surgery | Admitting: Surgery

## 2014-01-01 ENCOUNTER — Encounter (HOSPITAL_COMMUNITY): Payer: Self-pay

## 2014-01-01 DIAGNOSIS — Z01812 Encounter for preprocedural laboratory examination: Secondary | ICD-10-CM | POA: Diagnosis present

## 2014-01-01 LAB — CBC
HCT: 47.5 % (ref 39.0–52.0)
Hemoglobin: 16.1 g/dL (ref 13.0–17.0)
MCH: 30.9 pg (ref 26.0–34.0)
MCHC: 33.9 g/dL (ref 30.0–36.0)
MCV: 91.2 fL (ref 78.0–100.0)
Platelets: 109 10*3/uL — ABNORMAL LOW (ref 150–400)
RBC: 5.21 MIL/uL (ref 4.22–5.81)
RDW: 13.6 % (ref 11.5–15.5)
WBC: 4.3 10*3/uL (ref 4.0–10.5)

## 2014-01-01 LAB — BASIC METABOLIC PANEL
ANION GAP: 11 (ref 5–15)
BUN: 13 mg/dL (ref 6–23)
CALCIUM: 9.7 mg/dL (ref 8.4–10.5)
CHLORIDE: 105 meq/L (ref 96–112)
CO2: 26 mEq/L (ref 19–32)
Creatinine, Ser: 0.89 mg/dL (ref 0.50–1.35)
GFR calc Af Amer: >90 mL/min (ref >90)
GFR calc non Af Amer: 83 mL/min — ABNORMAL LOW (ref >90)
Glucose, Bld: 124 mg/dL — ABNORMAL HIGH (ref 70–99)
Potassium: 4.6 mEq/L (ref 3.7–5.3)
Sodium: 142 mEq/L (ref 137–147)

## 2014-01-01 NOTE — Progress Notes (Signed)
Please enter orders in EPIC as patient has pre-op appointment 01/01/2014 at 1pm! Thank you!

## 2014-01-01 NOTE — Progress Notes (Signed)
   01/01/14 1322  OBSTRUCTIVE SLEEP APNEA  Have you ever been diagnosed with sleep apnea through a sleep study? No  Do you snore loudly (loud enough to be heard through closed doors)?  0  Do you often feel tired, fatigued, or sleepy during the daytime? 1  Has anyone observed you stop breathing during your sleep? 0  Do you have, or are you being treated for high blood pressure? 0  BMI more than 35 kg/m2? 0  Age over 72 years old? 1  Neck circumference greater than 40 cm/16 inches? 1  Gender: 1  Obstructive Sleep Apnea Score 4  Score 4 or greater  Results sent to PCP

## 2014-01-01 NOTE — Progress Notes (Signed)
CBC results faxed via EPIC to Dr Hassell Done.

## 2014-01-09 ENCOUNTER — Ambulatory Visit (INDEPENDENT_AMBULATORY_CARE_PROVIDER_SITE_OTHER): Payer: Self-pay | Admitting: Surgery

## 2014-01-09 NOTE — H&P (Signed)
Chief Complaint: Episodes of epigastric pain. Ultrasound showing multiple gallstones with one of the neck of the gallbladder.  History of Present Illness: Benjamin [name] is an 73 y.o. male on him previously done an umbilical hernia repair with Physio mesh. This is a little more prominent would like to see now. He has however had several bouts of epigastric pain and Dr. Velna Hatchet has obtained an ultrasound which showed multiple gallstones one of which was 9 mm and lodged in the cystic duct.  I have discussed laparoscopic cholecystectomy to him in some detail including complications not limited to common duct injury or bile leaks. 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   Past Medical History  Diagnosis Date  . Hyperlipidemia   . BPH (benign prostatic hyperplasia)     with elevated PSA, sees Dr. Diona Fanti, benign  . Anxiety   . Insomnia   . GERD (gastroesophageal reflux disease)   . Diabetes mellitus   . Elevated PSA     Past Surgical History  Procedure Laterality Date  . Prostate biopsies in 2003 and again 05/2008      per Dr. Diona Fanti. benign  . Right knee arthroscopy    . Reapir of large umblicial hernia      per Dr. Kaylyn Lim on 01/10/10  . Colonoscopy      02/28/10 per Dr. Carlean Purl, single adenomatous polyp, repeat in 5 yrs.   . Liposuction  1987    Current Outpatient Prescriptions  Medication Sig Dispense Refill  . ALPRAZolam (XANAX) 1 MG tablet TAKE ONE TABLET EVERY 6 HOURS AS NEEDED 120 tablet 5  . omeprazole (PRILOSEC) 40 MG capsule Take 1 capsule (40 mg total) by mouth daily. 90 capsule 3   No current facility-administered medications for this visit.   Review of patient's allergies indicates no known allergies. Family History  Problem Relation Age of Onset  . Alzheimer's disease Other    Social History:  reports that he quit smoking about 40 years  ago. He has never used smokeless tobacco. He reports that he drinks about 3.5 ounces of alcohol per week. He reports that he uses illicit drugs (Marijuana).   REVIEW OF SYSTEMS : Negative except for BPH.  Physical Exam:  Blood pressure 140/72, pulse 63, temperature 97.6 F (36.4 C), temperature source Oral, height 6' (1.829 m), weight 230 lb 4 oz (104.441 kg). Body mass index is 31.22 kg/(m^2).  Gen: WDWN white male NAD  Neurological: Alert and oriented to person, place, and time. Motor and sensory function is grossly intact  Head: Normocephalic and atraumatic.  Eyes: Conjunctivae are normal. Pupils are equal, round, and reactive to light. No scleral icterus.  Neck: Normal range of motion. Neck supple. No tracheal deviation or thyromegaly present.  Cardiovascular: SR without murmurs or gallops. No carotid bruits Breast: Not examined Respiratory: Effort normal. No respiratory distress. No chest wall tenderness. Breath sounds normal. No wheezes, rales or rhonchi.  Abdomen: Prominent broad soft area in the previous hernia at may be mesh beneath the umbilical skin. GU: unremarkable Musculoskeletal: Normal range of motion. Extremities are nontender. No cyanosis, edema or clubbing noted Lymphadenopathy: No cervical, preauricular, postauricular or axillary adenopathy is present Skin: Skin is warm and dry. No rash noted. No diaphoresis. No erythema. No pallor. Pscyh: Normal mood and affect. Behavior is normal. Judgment and thought content normal.   LABORATORY RESULTS:  Lab Results Last 48 Hours    No results found for this or any previous visit (from  the past 48 hour(s)).     RADIOLOGY RESULTS:  Imaging Results (Last 48 hours)    No results found.    Problem List: Patient Active Problem List   Diagnosis Date Noted  . Unspecified hypothyroidism 07/10/2013  . Type II or unspecified type diabetes mellitus without mention of complication, not stated as uncontrolled  04/14/2011  . HYPOGONADISM 03/07/2010  . HEMATURIA UNSPECIFIED 01/08/2010  . KNEE PAIN, RIGHT 09/23/2009  . UMB HERNIA WITHOUT MENTION OBSTRUCTION/GANGRENE 08/16/2009  . ACUTE BRONCHITIS 06/24/2009  . 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 IRRITABLE BOWEL SYNDROME 09/03/2008  . MALAISE AND FATIGUE 08/17/2008  . ABDOMINAL BLOATING 08/17/2008  . FINGER SPRAIN 04/24/2008  . GERD 08/15/2007  . ANXIETY 02/16/2007  . BENIGN PROSTATIC HYPERTROPHY 02/16/2007  . 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 CHICKENPOX, HX OF 02/16/2007  . PROSTATE SPECIFIC ANTIGEN, ELEVATED 01/26/2007  . HYPERLIPIDEMIA 10/12/2006    Assessment & Plan: Symptoms and history compatible with biliary colic and gallstones by ultrasound. We'll recommend laparoscopic cholecystectomy with IOC.    [name], MD, [facility] [phone] beeper [phone]

## 2014-01-10 ENCOUNTER — Observation Stay (HOSPITAL_COMMUNITY)
Admission: RE | Admit: 2014-01-10 | Discharge: 2014-01-11 | Disposition: A | Discharge status: Home / Self Care | Payer: Medicare Other | Source: Ambulatory Visit | Attending: Surgery | Admitting: Surgery

## 2014-01-10 ENCOUNTER — Ambulatory Visit (HOSPITAL_COMMUNITY): Payer: Medicare Other | Admitting: Anesthesiology

## 2014-01-10 ENCOUNTER — Ambulatory Visit (HOSPITAL_COMMUNITY): Payer: Medicare Other

## 2014-01-10 ENCOUNTER — Encounter (HOSPITAL_COMMUNITY)
Admission: RE | Disposition: A | Discharge status: Home / Self Care | Payer: Self-pay | Source: Ambulatory Visit | Attending: Surgery

## 2014-01-10 ENCOUNTER — Encounter (HOSPITAL_COMMUNITY): Payer: Self-pay | Admitting: *Deleted

## 2014-01-10 DIAGNOSIS — E785 Hyperlipidemia, unspecified: Secondary | ICD-10-CM | POA: Insufficient documentation

## 2014-01-10 DIAGNOSIS — R1013 Epigastric pain: Secondary | ICD-10-CM | POA: Diagnosis present

## 2014-01-10 DIAGNOSIS — Z419 Encounter for procedure for purposes other than remedying health state, unspecified: Secondary | ICD-10-CM

## 2014-01-10 DIAGNOSIS — E039 Hypothyroidism, unspecified: Secondary | ICD-10-CM | POA: Insufficient documentation

## 2014-01-10 DIAGNOSIS — N400 Enlarged prostate without lower urinary tract symptoms: Secondary | ICD-10-CM | POA: Insufficient documentation

## 2014-01-10 DIAGNOSIS — G4700 Insomnia, unspecified: Secondary | ICD-10-CM | POA: Insufficient documentation

## 2014-01-10 DIAGNOSIS — Z87891 Personal history of nicotine dependence: Secondary | ICD-10-CM | POA: Diagnosis not present

## 2014-01-10 DIAGNOSIS — K589 Irritable bowel syndrome without diarrhea: Secondary | ICD-10-CM | POA: Diagnosis not present

## 2014-01-10 DIAGNOSIS — K219 Gastro-esophageal reflux disease without esophagitis: Secondary | ICD-10-CM | POA: Insufficient documentation

## 2014-01-10 DIAGNOSIS — F419 Anxiety disorder, unspecified: Secondary | ICD-10-CM | POA: Diagnosis not present

## 2014-01-10 DIAGNOSIS — E119 Type 2 diabetes mellitus without complications: Secondary | ICD-10-CM | POA: Insufficient documentation

## 2014-01-10 DIAGNOSIS — K8010 Calculus of gallbladder with chronic cholecystitis without obstruction: Secondary | ICD-10-CM | POA: Diagnosis not present

## 2014-01-10 DIAGNOSIS — E291 Testicular hypofunction: Secondary | ICD-10-CM | POA: Diagnosis not present

## 2014-01-10 HISTORY — PX: CHOLECYSTECTOMY: SHX55

## 2014-01-10 LAB — CREATININE, SERUM
Creatinine, Ser: 0.91 mg/dL (ref 0.50–1.35)
GFR calc non Af Amer: 83 mL/min — ABNORMAL LOW (ref >90)

## 2014-01-10 LAB — CBC
HEMATOCRIT: 45 % (ref 39.0–52.0)
Hemoglobin: 15.2 g/dL (ref 13.0–17.0)
MCH: 30.5 pg (ref 26.0–34.0)
MCHC: 33.8 g/dL (ref 30.0–36.0)
MCV: 90.4 fL (ref 78.0–100.0)
PLATELETS: 94 10*3/uL — AB (ref 150–400)
RBC: 4.98 MIL/uL (ref 4.22–5.81)
RDW: 13.7 % (ref 11.5–15.5)
WBC: 6.6 10*3/uL (ref 4.0–10.5)

## 2014-01-10 LAB — GLUCOSE, CAPILLARY: GLUCOSE-CAPILLARY: 131 mg/dL — AB (ref 70–99)

## 2014-01-10 SURGERY — LAPAROSCOPIC CHOLECYSTECTOMY WITH INTRAOPERATIVE CHOLANGIOGRAM
Anesthesia: General

## 2014-01-10 MED ORDER — LACTATED RINGERS IR SOLN
Status: DC | PRN
  Administered 2014-01-10: 1

## 2014-01-10 MED ORDER — EPHEDRINE SULFATE 50 MG/ML IJ SOLN
INTRAMUSCULAR | Status: AC
  Filled 2014-01-10: qty 1

## 2014-01-10 MED ORDER — EPHEDRINE SULFATE 50 MG/ML IJ SOLN
INTRAMUSCULAR | Status: DC | PRN
  Administered 2014-01-10 (×3): 5 mg via INTRAVENOUS

## 2014-01-10 MED ORDER — HYDROMORPHONE HCL 2 MG/ML IJ SOLN
INTRAMUSCULAR | Status: AC
  Filled 2014-01-10: qty 1

## 2014-01-10 MED ORDER — LACTATED RINGERS IV SOLN: INTRAVENOUS | Status: DC

## 2014-01-10 MED ORDER — MORPHINE SULFATE 2 MG/ML IJ SOLN
1.0000 mg | INTRAMUSCULAR | Status: DC | PRN
Start: 2014-01-10 — End: 2014-01-11

## 2014-01-10 MED ORDER — SODIUM CHLORIDE 0.9 % IJ SOLN
INTRAMUSCULAR | Status: DC | PRN
  Administered 2014-01-10: 5 mL

## 2014-01-10 MED ORDER — ONDANSETRON HCL 4 MG/2ML IJ SOLN
INTRAMUSCULAR | Status: DC | PRN
  Administered 2014-01-10: 4 mg via INTRAVENOUS

## 2014-01-10 MED ORDER — DEXTROSE 5 % IV SOLN
INTRAVENOUS | Status: AC
  Filled 2014-01-10: qty 2

## 2014-01-10 MED ORDER — CHLORHEXIDINE GLUCONATE 4 % EX LIQD: 1.0000 "application " | Freq: Once | CUTANEOUS | Status: DC

## 2014-01-10 MED ORDER — HYDROMORPHONE HCL 1 MG/ML IJ SOLN: 0.2500 mg | INTRAMUSCULAR | Status: DC | PRN

## 2014-01-10 MED ORDER — GLYCOPYRROLATE 0.2 MG/ML IJ SOLN
INTRAMUSCULAR | Status: AC
  Filled 2014-01-10: qty 3

## 2014-01-10 MED ORDER — DEXAMETHASONE SODIUM PHOSPHATE 10 MG/ML IJ SOLN
INTRAMUSCULAR | Status: DC | PRN
  Administered 2014-01-10: 10 mg via INTRAVENOUS

## 2014-01-10 MED ORDER — NEOSTIGMINE METHYLSULFATE 10 MG/10ML IV SOLN
INTRAVENOUS | Status: AC
  Filled 2014-01-10: qty 1

## 2014-01-10 MED ORDER — PROPOFOL 10 MG/ML IV BOLUS
INTRAVENOUS | Status: AC
  Filled 2014-01-10: qty 20

## 2014-01-10 MED ORDER — LIDOCAINE HCL (CARDIAC) 20 MG/ML IV SOLN
INTRAVENOUS | Status: DC | PRN
  Administered 2014-01-10: 100 mg via INTRAVENOUS

## 2014-01-10 MED ORDER — ACETAMINOPHEN 160 MG/5ML PO SOLN
325.0000 mg | ORAL | Status: DC | PRN
  Filled 2014-01-10: qty 20.3

## 2014-01-10 MED ORDER — OXYCODONE HCL 5 MG PO TABS: 5.0000 mg | ORAL_TABLET | Freq: Once | ORAL | Status: DC | PRN

## 2014-01-10 MED ORDER — LIDOCAINE HCL (CARDIAC) 20 MG/ML IV SOLN
INTRAVENOUS | Status: AC
  Filled 2014-01-10: qty 5

## 2014-01-10 MED ORDER — SODIUM CHLORIDE 0.9 % IJ SOLN
INTRAMUSCULAR | Status: AC
  Filled 2014-01-10: qty 10

## 2014-01-10 MED ORDER — ACETAMINOPHEN 325 MG PO TABS: 650.0000 mg | ORAL_TABLET | ORAL | Status: DC | PRN

## 2014-01-10 MED ORDER — BUPIVACAINE LIPOSOME 1.3 % IJ SUSP
20.0000 mL | Freq: Once | INTRAMUSCULAR | Status: AC
  Administered 2014-01-10: 20 mL
  Filled 2014-01-10: qty 20

## 2014-01-10 MED ORDER — 0.9 % SODIUM CHLORIDE (POUR BTL) OPTIME
TOPICAL | Status: DC | PRN
  Administered 2014-01-10: 1000 mL

## 2014-01-10 MED ORDER — MIDAZOLAM HCL 5 MG/5ML IJ SOLN
INTRAMUSCULAR | Status: DC | PRN
  Administered 2014-01-10: 2 mg via INTRAVENOUS

## 2014-01-10 MED ORDER — FENTANYL CITRATE 0.05 MG/ML IJ SOLN
INTRAMUSCULAR | Status: DC | PRN
  Administered 2014-01-10 (×5): 50 ug via INTRAVENOUS

## 2014-01-10 MED ORDER — MIDAZOLAM HCL 2 MG/2ML IJ SOLN
INTRAMUSCULAR | Status: AC
  Filled 2014-01-10: qty 2

## 2014-01-10 MED ORDER — KCL IN DEXTROSE-NACL 20-5-0.45 MEQ/L-%-% IV SOLN
INTRAVENOUS | Status: DC
  Administered 2014-01-10: 100 mL/h via INTRAVENOUS
  Administered 2014-01-10: 13:00:00 via INTRAVENOUS
  Filled 2014-01-10 (×3): qty 1000

## 2014-01-10 MED ORDER — ALPRAZOLAM 1 MG PO TABS
2.0000 mg | ORAL_TABLET | Freq: Four times a day (QID) | ORAL | Status: DC | PRN
  Administered 2014-01-10: 2 mg via ORAL
  Filled 2014-01-10: qty 2

## 2014-01-10 MED ORDER — DEXAMETHASONE SODIUM PHOSPHATE 10 MG/ML IJ SOLN
INTRAMUSCULAR | Status: AC
  Filled 2014-01-10: qty 1

## 2014-01-10 MED ORDER — NEOSTIGMINE METHYLSULFATE 10 MG/10ML IV SOLN
INTRAVENOUS | Status: DC | PRN
  Administered 2014-01-10: 4 mg via INTRAVENOUS

## 2014-01-10 MED ORDER — PROPOFOL 10 MG/ML IV BOLUS
INTRAVENOUS | Status: DC | PRN
  Administered 2014-01-10: 50 mg via INTRAVENOUS
  Administered 2014-01-10: 70 mg via INTRAVENOUS
  Administered 2014-01-10: 200 mg via INTRAVENOUS

## 2014-01-10 MED ORDER — DEXTROSE 5 % IV SOLN
2.0000 g | INTRAVENOUS | Status: AC
  Administered 2014-01-10: 2 g via INTRAVENOUS

## 2014-01-10 MED ORDER — FENTANYL CITRATE 0.05 MG/ML IJ SOLN
INTRAMUSCULAR | Status: AC
  Filled 2014-01-10: qty 5

## 2014-01-10 MED ORDER — ACETAMINOPHEN 325 MG PO TABS: 325.0000 mg | ORAL_TABLET | ORAL | Status: DC | PRN

## 2014-01-10 MED ORDER — ONDANSETRON HCL 4 MG/2ML IJ SOLN
INTRAMUSCULAR | Status: AC
  Filled 2014-01-10: qty 2

## 2014-01-10 MED ORDER — LACTATED RINGERS IV SOLN
INTRAVENOUS | Status: DC | PRN
  Administered 2014-01-10 (×2): via INTRAVENOUS

## 2014-01-10 MED ORDER — HYDROCODONE-ACETAMINOPHEN 5-325 MG PO TABS: 1.0000 | ORAL_TABLET | ORAL | Status: DC | PRN

## 2014-01-10 MED ORDER — PANTOPRAZOLE SODIUM 40 MG PO TBEC
40.0000 mg | DELAYED_RELEASE_TABLET | Freq: Every day | ORAL | Status: DC
  Administered 2014-01-10: 40 mg via ORAL
  Filled 2014-01-10 (×2): qty 1

## 2014-01-10 MED ORDER — ONDANSETRON HCL 4 MG PO TABS: 4.0000 mg | ORAL_TABLET | Freq: Four times a day (QID) | ORAL | Status: DC | PRN

## 2014-01-10 MED ORDER — HEPARIN SODIUM (PORCINE) 5000 UNIT/ML IJ SOLN
5000.0000 [IU] | Freq: Three times a day (TID) | INTRAMUSCULAR | Status: DC
  Administered 2014-01-10 – 2014-01-11 (×2): 5000 [IU] via SUBCUTANEOUS
  Filled 2014-01-10 (×5): qty 1

## 2014-01-10 MED ORDER — ONDANSETRON HCL 4 MG/2ML IJ SOLN: 4.0000 mg | Freq: Four times a day (QID) | INTRAMUSCULAR | Status: DC | PRN

## 2014-01-10 MED ORDER — SUCCINYLCHOLINE CHLORIDE 20 MG/ML IJ SOLN
INTRAMUSCULAR | Status: DC | PRN
  Administered 2014-01-10: 160 mg via INTRAVENOUS

## 2014-01-10 MED ORDER — ROCURONIUM BROMIDE 100 MG/10ML IV SOLN
INTRAVENOUS | Status: DC | PRN
  Administered 2014-01-10: 50 mg via INTRAVENOUS
  Administered 2014-01-10: 10 mg via INTRAVENOUS

## 2014-01-10 MED ORDER — ROCURONIUM BROMIDE 100 MG/10ML IV SOLN
INTRAVENOUS | Status: AC
  Filled 2014-01-10: qty 1

## 2014-01-10 MED ORDER — HEPARIN SODIUM (PORCINE) 5000 UNIT/ML IJ SOLN
5000.0000 [IU] | Freq: Once | INTRAMUSCULAR | Status: AC
  Administered 2014-01-10: 5000 [IU] via SUBCUTANEOUS
  Filled 2014-01-10: qty 1

## 2014-01-10 MED ORDER — OXYCODONE HCL 5 MG/5ML PO SOLN
5.0000 mg | Freq: Once | ORAL | Status: DC | PRN
Start: 2014-01-10 — End: 2014-01-10
  Filled 2014-01-10: qty 5

## 2014-01-10 MED ORDER — GLYCOPYRROLATE 0.2 MG/ML IJ SOLN
INTRAMUSCULAR | Status: DC | PRN
  Administered 2014-01-10: 0.6 mg via INTRAVENOUS

## 2014-01-10 MED ORDER — HYDROMORPHONE HCL 1 MG/ML IJ SOLN
INTRAMUSCULAR | Status: DC | PRN
  Administered 2014-01-10: 1 mg via INTRAVENOUS
  Administered 2014-01-10 (×2): 0.5 mg via INTRAVENOUS

## 2014-01-10 SURGICAL SUPPLY — 38 items
APL SKNCLS STERI-STRIP NONHPOA (GAUZE/BANDAGES/DRESSINGS)
APPLIER CLIP ROT 10 11.4 M/L (STAPLE) ×2
APR CLP MED LRG 11.4X10 (STAPLE) ×1
BAG SPEC RTRVL 10 TROC 200 (ENDOMECHANICALS)
BENZOIN TINCTURE PRP APPL 2/3 (GAUZE/BANDAGES/DRESSINGS) IMPLANT
CABLE HIGH FREQUENCY MONO STRZ (ELECTRODE) IMPLANT
CATH REDDICK CHOLANGI 4FR 50CM (CATHETERS) ×2 IMPLANT
CLIP APPLIE ROT 10 11.4 M/L (STAPLE) ×1 IMPLANT
COVER MAYO STAND STRL (DRAPES) ×2 IMPLANT
DECANTER SPIKE VIAL GLASS SM (MISCELLANEOUS) ×1 IMPLANT
DRAPE C-ARM 42X120 X-RAY (DRAPES) ×2 IMPLANT
DRAPE LAPAROSCOPIC ABDOMINAL (DRAPES) ×2 IMPLANT
ELECT REM PT RETURN 9FT ADLT (ELECTROSURGICAL) ×2
ELECTRODE REM PT RTRN 9FT ADLT (ELECTROSURGICAL) ×1 IMPLANT
GLOVE BIOGEL M 8.0 STRL (GLOVE) ×2 IMPLANT
GLOVE BIOGEL PI IND STRL 6.5 (GLOVE) IMPLANT
GLOVE BIOGEL PI INDICATOR 6.5 (GLOVE) ×3
GLOVE ECLIPSE 7.0 STRL STRAW (GLOVE) ×1 IMPLANT
GOWN STRL REUS W/TWL XL LVL3 (GOWN DISPOSABLE) ×8 IMPLANT
HEMOSTAT SURGICEL 4X8 (HEMOSTASIS) IMPLANT
IV CATH 14GX2 1/4 (CATHETERS) ×2 IMPLANT
KIT BASIN OR (CUSTOM PROCEDURE TRAY) ×2 IMPLANT
LIQUID BAND (GAUZE/BANDAGES/DRESSINGS) ×2 IMPLANT
POUCH RETRIEVAL ECOSAC 10 (ENDOMECHANICALS) IMPLANT
POUCH RETRIEVAL ECOSAC 10MM (ENDOMECHANICALS)
SCISSORS LAP 5X45 EPIX DISP (ENDOMECHANICALS) ×2 IMPLANT
SCRUB PCMX 4 OZ (MISCELLANEOUS) ×2 IMPLANT
SET IRRIG TUBING LAPAROSCOPIC (IRRIGATION / IRRIGATOR) ×2 IMPLANT
SHEARS HARMONIC ACE PLUS 45CM (MISCELLANEOUS) ×1 IMPLANT
SLEEVE XCEL OPT CAN 5 100 (ENDOMECHANICALS) ×4 IMPLANT
STRIP CLOSURE SKIN 1/2X4 (GAUZE/BANDAGES/DRESSINGS) IMPLANT
SUT VIC AB 4-0 SH 18 (SUTURE) ×2 IMPLANT
SYR 20CC LL (SYRINGE) ×2 IMPLANT
TOWEL OR 17X26 10 PK STRL BLUE (TOWEL DISPOSABLE) ×2 IMPLANT
TRAY LAPAROSCOPIC (CUSTOM PROCEDURE TRAY) ×2 IMPLANT
TROCAR BLADELESS OPT 5 100 (ENDOMECHANICALS) ×2 IMPLANT
TROCAR XCEL BLUNT TIP 100MML (ENDOMECHANICALS) IMPLANT
TROCAR XCEL NON-BLD 11X100MML (ENDOMECHANICALS) ×2 IMPLANT

## 2014-01-10 NOTE — H&P (View-Only) (Signed)
Chief Complaint: Episodes of epigastric pain. Ultrasound showing multiple gallstones with one of the neck of the gallbladder.  History of Present Illness: Benjamin [name] is an 73 y.o. male on him previously done an umbilical hernia repair with Physio mesh. This is a little more prominent would like to see now. He has however had several bouts of epigastric pain and Dr. Velna Hatchet has obtained an ultrasound which showed multiple gallstones one of which was 9 mm and lodged in the cystic duct.  I have discussed laparoscopic cholecystectomy to him in some detail including complications not limited to common duct injury or bile leaks. 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   Past Medical History  Diagnosis Date  . Hyperlipidemia   . BPH (benign prostatic hyperplasia)     with elevated PSA, sees Dr. Diona Fanti, benign  . Anxiety   . Insomnia   . GERD (gastroesophageal reflux disease)   . Diabetes mellitus   . Elevated PSA     Past Surgical History  Procedure Laterality Date  . Prostate biopsies in 2003 and again 05/2008      per Dr. Diona Fanti. benign  . Right knee arthroscopy    . Reapir of large umblicial hernia      per Dr. Kaylyn Lim on 01/10/10  . Colonoscopy      02/28/10 per Dr. Carlean Purl, single adenomatous polyp, repeat in 5 yrs.   . Liposuction  1987    Current Outpatient Prescriptions  Medication Sig Dispense Refill  . ALPRAZolam (XANAX) 1 MG tablet TAKE ONE TABLET EVERY 6 HOURS AS NEEDED 120 tablet 5  . omeprazole (PRILOSEC) 40 MG capsule Take 1 capsule (40 mg total) by mouth daily. 90 capsule 3   No current facility-administered medications for this visit.   Review of patient's allergies indicates no known allergies. Family History  Problem Relation Age of Onset  . Alzheimer's disease Other    Social History:  reports that he quit smoking about 40 years  ago. He has never used smokeless tobacco. He reports that he drinks about 3.5 ounces of alcohol per week. He reports that he uses illicit drugs (Marijuana).   REVIEW OF SYSTEMS : Negative except for BPH.  Physical Exam:  Blood pressure 140/72, pulse 63, temperature 97.6 F (36.4 C), temperature source Oral, height 6' (1.829 m), weight 230 lb 4 oz (104.441 kg). Body mass index is 31.22 kg/(m^2).  Gen: WDWN white male NAD  Neurological: Alert and oriented to person, place, and time. Motor and sensory function is grossly intact  Head: Normocephalic and atraumatic.  Eyes: Conjunctivae are normal. Pupils are equal, round, and reactive to light. No scleral icterus.  Neck: Normal range of motion. Neck supple. No tracheal deviation or thyromegaly present.  Cardiovascular: SR without murmurs or gallops. No carotid bruits Breast: Not examined Respiratory: Effort normal. No respiratory distress. No chest wall tenderness. Breath sounds normal. No wheezes, rales or rhonchi.  Abdomen: Prominent broad soft area in the previous hernia at may be mesh beneath the umbilical skin. GU: unremarkable Musculoskeletal: Normal range of motion. Extremities are nontender. No cyanosis, edema or clubbing noted Lymphadenopathy: No cervical, preauricular, postauricular or axillary adenopathy is present Skin: Skin is warm and dry. No rash noted. No diaphoresis. No erythema. No pallor. Pscyh: Normal mood and affect. Behavior is normal. Judgment and thought content normal.   LABORATORY RESULTS:  Lab Results Last 48 Hours    No results found for this or any previous visit (from  the past 48 hour(s)).     RADIOLOGY RESULTS:  Imaging Results (Last 48 hours)    No results found.    Problem List: Patient Active Problem List   Diagnosis Date Noted  . Unspecified hypothyroidism 07/10/2013  . Type II or unspecified type diabetes mellitus without mention of complication, not stated as uncontrolled  04/14/2011  . HYPOGONADISM 03/07/2010  . HEMATURIA UNSPECIFIED 01/08/2010  . KNEE PAIN, RIGHT 09/23/2009  . UMB HERNIA WITHOUT MENTION OBSTRUCTION/GANGRENE 08/16/2009  . ACUTE BRONCHITIS 06/24/2009  . 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 IRRITABLE BOWEL SYNDROME 09/03/2008  . MALAISE AND FATIGUE 08/17/2008  . ABDOMINAL BLOATING 08/17/2008  . FINGER SPRAIN 04/24/2008  . GERD 08/15/2007  . ANXIETY 02/16/2007  . BENIGN PROSTATIC HYPERTROPHY 02/16/2007  . 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 CHICKENPOX, HX OF 02/16/2007  . PROSTATE SPECIFIC ANTIGEN, ELEVATED 01/26/2007  . HYPERLIPIDEMIA 10/12/2006    Assessment & Plan: Symptoms and history compatible with biliary colic and gallstones by ultrasound. We'll recommend laparoscopic cholecystectomy with IOC.    [name], MD, [facility] [phone] beeper [phone]

## 2014-01-10 NOTE — Transfer of Care (Signed)
Immediate Anesthesia Transfer of Care Note  Patient: Benjamin [name]  Procedure(s) Performed: Procedure(s): LAPAROSCOPIC CHOLECYSTECTOMY WITH INTRAOPERATIVE CHOLANGIOGRAM (N/A)  Patient Location: PACU  Anesthesia Type:General  Level of Consciousness: awake, alert  and oriented  Airway & Oxygen Therapy: Patient Spontanous Breathing and Patient connected to face mask oxygen  Post-op Assessment: Report given to PACU RN and Post -op Vital signs reviewed and stable  Post vital signs: Reviewed and stable  Complications: No apparent anesthesia complications

## 2014-01-10 NOTE — Anesthesia Procedure Notes (Signed)
Procedure Name: Intubation Date/Time: 01/10/2014 9:08 AM Performed by: Noralyn Pick Pre-anesthesia Checklist: Patient identified, Emergency Drugs available, Suction available, Patient being monitored and Timeout performed Patient Re-evaluated:Patient Re-evaluated prior to inductionOxygen Delivery Method: Circle system utilized Preoxygenation: Pre-oxygenation with 100% oxygen Intubation Type: IV induction Ventilation: Mask ventilation with difficulty, Two handed mask ventilation required and Oral airway inserted - appropriate to patient size Laryngoscope Size: 4 and Mac Grade View: Grade III Tube type: Oral Tube size: 7.5 mm Number of attempts: 3 Airway Equipment and Method: Bougie stylet,  Stylet and Oral airway Placement Confirmation: positive ETCO2,  CO2 detector and breath sounds checked- equal and bilateral Secured at: 23 cm Tube secured with: Tape Dental Injury: Injury to lip  Difficulty Due To: Difficulty was unanticipated, Difficult Airway- due to large tongue, Difficult Airway- due to reduced neck mobility, Difficult Airway- due to immobile epiglottis and Difficult Airway- due to anterior larynx Future Recommendations: Recommend- induction with short-acting agent, and alternative techniques readily available Comments: IV induction as expected. Preoxygenation with 100% oxygen via mask. Oral airway inserted to assist with movement of air. First attempt at intubation with Mac #4 blade. View was challenging, large flexible epiglottis not allowing visualilzation of vocal cords. View of the posterior arytenoids, however unable to pass ETT through. Attempt discontinued and patient preoxygenated again, and more sedation and paralytic were given IV. 2nd attempt with Miller blade in an attempt to lift epiglottis was also unsuccessful by SRNA. Anestheisologist able to past bougie stylet with Mac #4 blade. ETT inserted atraumatically, with confirmation by equal bilateral breath sounds and positive  ETCO2. Small laceration (64mm) on upper lip, midline.

## 2014-01-10 NOTE — Anesthesia Postprocedure Evaluation (Signed)
  Anesthesia Post-op Note  Patient: Benjamin [name]  Procedure(s) Performed: Procedure(s): LAPAROSCOPIC CHOLECYSTECTOMY WITH INTRAOPERATIVE CHOLANGIOGRAM (N/A)  Patient Location: PACU  Anesthesia Type:General  Level of Consciousness: awake  Airway and Oxygen Therapy: Patient Spontanous Breathing  Post-op Pain: none  Post-op Assessment: Post-op Vital signs reviewed, Patient's Cardiovascular Status Stable, Respiratory Function Stable, Patent Airway, No signs of Nausea or vomiting and Pain level controlled  Post-op Vital Signs: Reviewed and stable  Last Vitals:  Filed Vitals:   01/10/14 1537  BP: 140/68  Pulse: 72  Temp: 36.4 C  Resp: 16    Complications: No apparent anesthesia complications

## 2014-01-10 NOTE — Interval H&P Note (Signed)
History and Physical Interval Note:  01/10/2014 8:38 AM  Benjamin [name]  has presented today for surgery, with the diagnosis of gallstones  The various methods of treatment have been discussed with the patient and family. After consideration of risks, benefits and other options for treatment, the patient has consented to  Procedure(s): LAPAROSCOPIC CHOLECYSTECTOMY WITH INTRAOPERATIVE CHOLANGIOGRAM (N/A) as a surgical intervention .  The patient's history has been reviewed, patient examined, no change in status, stable for surgery.  I have reviewed the patient's chart and labs.  Questions were answered to the patient's satisfaction.     Nima Kemppainen B

## 2014-01-10 NOTE — Anesthesia Preprocedure Evaluation (Addendum)
Anesthesia Evaluation  Patient identified by MRN, date of birth, ID band Patient awake    Reviewed: Allergy & Precautions, H&P , NPO status , Patient's Chart, lab work & pertinent test results  History of Anesthesia Complications Negative for: history of anesthetic complications  Airway Mallampati: II  TM Distance: >3 FB Neck ROM: Full    Dental  (+) Teeth Intact   Pulmonary neg COPDneg recent URI, former smoker,  Heavy snoring, no sleep study breath sounds clear to auscultation        Cardiovascular negative cardio ROS  Rhythm:Regular     Neuro/Psych PSYCHIATRIC DISORDERS Anxiety negative neurological ROS     GI/Hepatic GERD-  Medicated and Controlled,Gallstones   Endo/Other  Elevated TSH in 06/2013, being followed, elevated BG without diagnosis of DM  Renal/GU      Musculoskeletal   Abdominal   Peds  Hematology negative hematology ROS (+)   Anesthesia Other Findings   Reproductive/Obstetrics                            Anesthesia Physical Anesthesia Plan  ASA: III  Anesthesia Plan: General   Post-op Pain Management:    Induction: Intravenous  Airway Management Planned: Oral ETT  Additional Equipment: None  Intra-op Plan:   Post-operative Plan: Extubation in OR  Informed Consent: I have reviewed the patients History and Physical, chart, labs and discussed the procedure including the risks, benefits and alternatives for the proposed anesthesia with the patient or authorized representative who has indicated his/her understanding and acceptance.   Dental advisory given  Plan Discussed with: CRNA and Surgeon  Anesthesia Plan Comments:         Anesthesia Quick Evaluation

## 2014-01-10 NOTE — Op Note (Signed)
Benjamin [name] @date @  Procedure: Laparoscopic Cholecystectomy with intraoperative cholangiogram  Surgeon: Kaylyn Lim, MD, FACS Asst:  none  Anes:  General  Drains: None  Findings: Chronic cholecystitis with normal IOC  Description of Procedure: The patient was taken to OR 1 and given general anesthesia.  The patient was prepped with PCMX and draped sterilely. A time out was performed.  Access to the abdomen was achieved with a 5 mm Optiview in the right upper quadrant.  Port placement included three 5 mm trocars and an 11 in the upper midline.  First, I examined his prior unbilical hernia repair.  The Physio mesh was in place and the bulge in the umbilicus was due to mesh laxity but the repair itself was intact.  The omentum that was stuck to it was taken down with blunt and Harmonic dissection.  Bleeding controlled.    The gallbladder was visualized and the fundus was grasped and the gallbladder was elevated. Traction on the infundibulum allowed for successful demonstration of the critical view. Inflammatory changes were chronic.  The cystic duct was identified and clipped up on the gallbladder and an incision was made in the cystic duct and the Reddick catheter was inserted after milking the cystic duct of any debris. A dynamic cholangiogram was performed which demonstrated intrahepatic filling and free flow into the duodenum.    The cystic duct was then triple clipped and divided, the cystic artery was double clipped and divided and then the gallbladder was removed from the gallbladder bed. Removal of the gallbladder from the gallbladder bed was performed without entering it.  The gallbladder was then placed in a bag and brought out through one of the 10 mm trocar sites. The gallbladder bed was inspected and no bleeding or bile leaks were seen.   Laparoscopic visualization was used when closing fascial defects for trocar sites.   Incisions were injected with Exparel and closed with 4-0 Vicryl  and Dermabond on the skin.  Sponge and needle count were correct.    The patient was taken to the recovery room in satisfactory condition.

## 2014-01-11 ENCOUNTER — Encounter (HOSPITAL_COMMUNITY): Payer: Self-pay | Admitting: Surgery

## 2014-01-11 DIAGNOSIS — K8010 Calculus of gallbladder with chronic cholecystitis without obstruction: Secondary | ICD-10-CM | POA: Diagnosis not present

## 2014-01-11 LAB — COMPREHENSIVE METABOLIC PANEL
ALT: 54 U/L — AB (ref 0–53)
AST: 37 U/L (ref 0–37)
Albumin: 3.7 g/dL (ref 3.5–5.2)
Alkaline Phosphatase: 63 U/L (ref 39–117)
Anion gap: 14 (ref 5–15)
BUN: 12 mg/dL (ref 6–23)
CALCIUM: 9.1 mg/dL (ref 8.4–10.5)
CHLORIDE: 102 meq/L (ref 96–112)
CO2: 25 meq/L (ref 19–32)
Creatinine, Ser: 0.89 mg/dL (ref 0.50–1.35)
GFR calc Af Amer: >90 mL/min (ref >90)
GFR, EST NON AFRICAN AMERICAN: 83 mL/min — AB (ref >90)
GLUCOSE: 138 mg/dL — AB (ref 70–99)
Potassium: 4 mEq/L (ref 3.7–5.3)
SODIUM: 141 meq/L (ref 137–147)
Total Bilirubin: 0.5 mg/dL (ref 0.3–1.2)
Total Protein: 6.5 g/dL (ref 6.0–8.3)

## 2014-01-11 LAB — CBC
HCT: 46.1 % (ref 39.0–52.0)
Hemoglobin: 15.3 g/dL (ref 13.0–17.0)
MCH: 30.4 pg (ref 26.0–34.0)
MCHC: 33.2 g/dL (ref 30.0–36.0)
MCV: 91.5 fL (ref 78.0–100.0)
Platelets: 133 10*3/uL — ABNORMAL LOW (ref 150–400)
RBC: 5.04 MIL/uL (ref 4.22–5.81)
RDW: 13.8 % (ref 11.5–15.5)
WBC: 7.7 10*3/uL (ref 4.0–10.5)

## 2014-01-11 LAB — URINALYSIS, ROUTINE W REFLEX MICROSCOPIC
BILIRUBIN URINE: NEGATIVE
Glucose, UA: NEGATIVE mg/dL
HGB URINE DIPSTICK: NEGATIVE
KETONES UR: NEGATIVE mg/dL
Leukocytes, UA: NEGATIVE
Nitrite: NEGATIVE
PROTEIN: NEGATIVE mg/dL
Specific Gravity, Urine: 1.009 (ref 1.005–1.030)
Urobilinogen, UA: 0.2 mg/dL (ref 0.0–1.0)
pH: 6.5 (ref 5.0–8.0)

## 2014-01-11 MED ORDER — HYDROCODONE-ACETAMINOPHEN 5-325 MG PO TABS: 1.0000 | ORAL_TABLET | ORAL | Status: DC | PRN

## 2014-01-11 NOTE — Plan of Care (Signed)
Problem: Phase I Progression Outcomes Goal: OOB as tolerated unless otherwise ordered Outcome: Completed/Met Date Met:  01/11/14

## 2014-01-11 NOTE — Discharge Instructions (Signed)

## 2014-01-11 NOTE — Plan of Care (Signed)
Problem: Phase I Progression Outcomes Goal: Voiding-avoid urinary catheter unless indicated Outcome: Completed/Met Date Met:  01/11/14

## 2014-01-11 NOTE — Plan of Care (Signed)
Problem: Phase II Progression Outcomes Goal: Progressing with IS, TCDB Outcome: Completed/Met Date Met:  01/11/14

## 2014-01-11 NOTE — Discharge Summary (Signed)
Physician Discharge Summary  Patient ID: Benjamin [name] MRN: [id number] DOB/AGE: Jan 01, 1941 73 y.o.  Admit date: 01/10/2014 Discharge date: 01/11/2014  Admission Diagnoses:  Chronic cholecystitis  Discharge Diagnoses:  same  Active Problems:   S/P laparoscopic cholecystectomy Dec 2015   Surgery:  Laparoscopic cholecystectomy  Discharged Condition: stable  Hospital Course:   Had surgery.  Kept overnight for observation.  Complained of some foul smelling urine and discomfort and concern about UTI.  UA ordered.    Consults: none  Significant Diagnostic Studies: IOC    Discharge Exam: Blood pressure 169/75, pulse 68, temperature 97.7 F (36.5 C), temperature source Oral, resp. rate 20, height 6' (1.829 m), weight 232 lb (105.235 kg), SpO2 94 %. Incisions OK  Disposition: Final discharge disposition not confirmed  Discharge Instructions    Diet - low sodium heart healthy    Complete by:  As directed      Discharge instructions    Complete by:  As directed   May shower Call [place] office tomorrow to check on your urine specimen     Increase activity slowly    Complete by:  As directed      No wound care    Complete by:  As directed             Medication List    TAKE these medications        alprazolam 2 MG tablet  Commonly known as:  XANAX  Take 1 tablet (2 mg total) by mouth every 6 (six) hours as needed for anxiety.     HYDROcodone-acetaminophen 5-325 MG per tablet  Commonly known as:  NORCO/VICODIN  Take 1-2 tablets by mouth every 4 (four) hours as needed for moderate pain.     naproxen 500 MG tablet  Commonly known as:  NAPROSYN  Take 500 mg by mouth 2 (two) times daily with a meal.     omeprazole 40 MG capsule  Commonly known as:  PRILOSEC  Take 1 capsule (40 mg total) by mouth 2 (two) times daily.           Follow-up Information    Follow up with Johnathan Hausen B, MD. Schedule an appointment as soon as possible for a visit in 3 weeks.   Specialty:   General Surgery   Why:  For wound re-check   Contact information:   [place] [address] [phone]       Signed: Pedro Earls 01/11/2014, 7:09 AM

## 2014-01-11 NOTE — Plan of Care (Signed)
Problem: Phase I Progression Outcomes Goal: Pain controlled with appropriate interventions Outcome: Completed/Met Date Met:  01/11/14     

## 2014-01-12 LAB — URINE CULTURE
CULTURE: NO GROWTH
Colony Count: NO GROWTH
Special Requests: NORMAL

## 2014-01-22 ENCOUNTER — Ambulatory Visit: Payer: Medicare Other | Admitting: Internal Medicine

## 2014-02-12 ENCOUNTER — Ambulatory Visit (INDEPENDENT_AMBULATORY_CARE_PROVIDER_SITE_OTHER): Payer: Medicare Other | Admitting: Family Medicine

## 2014-02-12 ENCOUNTER — Encounter: Payer: Self-pay | Admitting: Family Medicine

## 2014-02-12 VITALS — BP 130/68 | HR 78 | Temp 97.8°F | Resp 12 | Wt 237.0 lb

## 2014-02-12 DIAGNOSIS — B9789 Other viral agents as the cause of diseases classified elsewhere: Principal | ICD-10-CM

## 2014-02-12 DIAGNOSIS — J069 Acute upper respiratory infection, unspecified: Secondary | ICD-10-CM

## 2014-02-12 MED ORDER — CLARITHROMYCIN 500 MG PO TABS: 500.0000 mg | ORAL_TABLET | Freq: Two times a day (BID) | ORAL | Status: DC

## 2014-02-12 NOTE — Progress Notes (Signed)
   Subjective:    Patient ID: Benjamin [name], male    DOB: 01-09-1941, 74 y.o.   MRN: [id number]  HPI Acute visit. Patient seen with about 3 day history of sinus congestion, cough, intermittent headaches, chills but no documented fever. He's taken some over-the-counter Tylenol. Denies any nausea, vomiting, or diarrhea. He states when he has had similar symptoms past he requires antibiotic to get better. He's not having any current facial pain.  Past Medical History  Diagnosis Date  . Hyperlipidemia   . BPH (benign prostatic hyperplasia)     with elevated PSA, sees Dr. Diona Fanti, benign  . Anxiety   . Insomnia   . GERD (gastroesophageal reflux disease)   . Diabetes mellitus   . Elevated PSA    Past Surgical History  Procedure Laterality Date  . Prostate biopsies in 2003 and again 05/2008      per Dr. Diona Fanti. benign  . Right knee arthroscopy    . Reapir of large umblicial hernia      per Dr. Kaylyn Lim on 01/10/10  . Colonoscopy      02/28/10 per Dr. Carlean Purl, single adenomatous polyp, repeat in 5 yrs.   . Liposuction  1987  . Refractive surgery    . Cholecystectomy N/A 01/10/2014    Procedure: LAPAROSCOPIC CHOLECYSTECTOMY WITH INTRAOPERATIVE CHOLANGIOGRAM;  Surgeon: Pedro Earls, MD;  Location: [facility];  Service: General;  Laterality: N/A;    reports that he quit smoking about 40 years ago. He has never used smokeless tobacco. He reports that he drinks about 3.5 oz of alcohol per week. He reports that he uses illicit drugs (Marijuana). family history includes Alzheimer's disease in his other. No Known Allergies    Review of Systems  Constitutional: Positive for chills and fatigue.  HENT: Positive for congestion.   Respiratory: Positive for cough. Negative for shortness of breath and wheezing.   Neurological: Positive for headaches.       Objective:   Physical Exam  Constitutional: He appears well-developed and well-nourished.  HENT:  Right Ear: External ear normal.    Left Ear: External ear normal.  Mouth/Throat: Oropharynx is clear and moist.  Neck: Neck supple.  Cardiovascular: Normal rate and regular rhythm.   Pulmonary/Chest: Effort normal and breath sounds normal. No respiratory distress. He has no wheezes. He has no rales.  Lymphadenopathy:    He has no cervical adenopathy.          Assessment & Plan:  Acute upper respiratory infection. Suspect viral. We've recommended symptomatic treatment at this time. Would only recommend starting antibiotics if he develops any fever or change of symptoms

## 2014-02-12 NOTE — Progress Notes (Signed)
Pre visit review using our clinic review tool, if applicable. No additional management support is needed unless otherwise documented below in the visit note. 

## 2014-02-12 NOTE — Patient Instructions (Signed)

## 2014-06-11 ENCOUNTER — Other Ambulatory Visit: Payer: Self-pay | Admitting: Family Medicine

## 2014-06-11 NOTE — Telephone Encounter (Signed)
Last filled 11/07/14 #120 w/ 5 rf. Last OV 02/12/2014

## 2014-06-12 NOTE — Telephone Encounter (Signed)
Call in #120 with 5 rf 

## 2014-07-05 ENCOUNTER — Encounter (HOSPITAL_COMMUNITY): Payer: Self-pay

## 2014-07-05 ENCOUNTER — Encounter (HOSPITAL_COMMUNITY)
Admission: RE | Admit: 2014-07-05 | Discharge: 2014-07-05 | Disposition: A | Discharge status: Home / Self Care | Payer: Medicare Other | Source: Ambulatory Visit | Attending: Orthopedic Surgery | Admitting: Orthopedic Surgery

## 2014-07-05 DIAGNOSIS — Z01818 Encounter for other preprocedural examination: Secondary | ICD-10-CM | POA: Diagnosis present

## 2014-07-05 HISTORY — DX: Unspecified osteoarthritis, unspecified site: M19.90

## 2014-07-05 LAB — URINALYSIS, ROUTINE W REFLEX MICROSCOPIC
Bilirubin Urine: NEGATIVE
GLUCOSE, UA: NEGATIVE mg/dL
HGB URINE DIPSTICK: NEGATIVE
Ketones, ur: NEGATIVE mg/dL
LEUKOCYTES UA: NEGATIVE
NITRITE: NEGATIVE
PROTEIN: NEGATIVE mg/dL
SPECIFIC GRAVITY, URINE: 1.026 (ref 1.005–1.030)
Urobilinogen, UA: 0.2 mg/dL (ref 0.0–1.0)
pH: 5.5 (ref 5.0–8.0)

## 2014-07-05 LAB — BASIC METABOLIC PANEL
Anion gap: 10 (ref 5–15)
BUN: 16 mg/dL (ref 6–20)
CALCIUM: 9.3 mg/dL (ref 8.9–10.3)
CO2: 26 mmol/L (ref 22–32)
Chloride: 104 mmol/L (ref 101–111)
Creatinine, Ser: 0.82 mg/dL (ref 0.61–1.24)
GFR calc Af Amer: >60 mL/min (ref >60)
Glucose, Bld: 110 mg/dL — ABNORMAL HIGH (ref 65–99)
Potassium: 4 mmol/L (ref 3.5–5.1)
SODIUM: 140 mmol/L (ref 135–145)

## 2014-07-05 LAB — APTT: APTT: 28 s (ref 24–37)

## 2014-07-05 LAB — PROTIME-INR
INR: 1 (ref 0.00–1.49)
PROTHROMBIN TIME: 13.4 s (ref 11.6–15.2)

## 2014-07-05 LAB — CBC
HCT: 45.4 % (ref 39.0–52.0)
HEMOGLOBIN: 15.4 g/dL (ref 13.0–17.0)
MCH: 32.2 pg (ref 26.0–34.0)
MCHC: 33.9 g/dL (ref 30.0–36.0)
MCV: 95 fL (ref 78.0–100.0)
Platelets: 106 10*3/uL — ABNORMAL LOW (ref 150–400)
RBC: 4.78 MIL/uL (ref 4.22–5.81)
RDW: 13.8 % (ref 11.5–15.5)
WBC: 3.5 10*3/uL — ABNORMAL LOW (ref 4.0–10.5)

## 2014-07-05 LAB — SURGICAL PCR SCREEN
MRSA, PCR: NEGATIVE
Staphylococcus aureus: NEGATIVE

## 2014-07-05 NOTE — Patient Instructions (Signed)
Benjamin [name]  07/05/2014   Your procedure is scheduled on:     Monday July 16, 2014  Report to [facility] Main Entrance and follow signs to  [facility] arrive at 7:50  AM.   Call this number if you have problems the morning of surgery [phone] or Presurgical Testing [phone].   Remember:  Do not eat food or drink liquids :After Midnight.  For Living Will and/or Health Care Power Attorney Forms: please provide copy for your medical record, may bring AM of surgery (forms should be already notarized-we do not provide this service).     Take these medicines the morning of surgery with A SIP OF WATER: ALprazolam (Xanax) if needed; Omeprazole (Prilosec)                               You may not have any metal on your body including hair pins and piercings  Do not wear jewelry, colognes,lotions, powders, or deodorant.  Men may shave face and neck.               Do not bring valuables to the hospital. Benjamin [name].  Contacts, dentures or bridgework may not be worn into surgery.  Leave suitcase in the car. After surgery it may be brought to your room.  For patients admitted to the hospital, checkout time is 11:00 AM the day of discharge.     Special Instructions: review fact sheets for MRSA information, Blood Transfusion fact sheet, Incentive Spirometry.  ONLY ONE PERSON WITH YOU IN SHORT STAY MORNING OF SURGERY. IF TIME PERMITS AFTER YOU ARE READY, OTHERS MAY VISIT. NO MORE THAT 2 PERSONS PER ROOM AT A TIME.  ________________________________________________________________________  [facility] - Preparing for Surgery Before surgery, you can play an important role.  Because skin is not sterile, your skin needs to be as free of germs as possible.  You can reduce the number of germs on your skin by washing with CHG (chlorahexidine gluconate) soap before surgery.  CHG is an antiseptic cleaner which kills germs and bonds with the skin to  continue killing germs even after washing. Please DO NOT use if you have an allergy to CHG or antibacterial soaps.  If your skin becomes reddened/irritated stop using the CHG and inform your nurse when you arrive at Short Stay. Do not shave (including legs and underarms) for at least 48 hours prior to the first CHG shower.  You may shave your face/neck. Please follow these instructions carefully:  1.  Shower with CHG Soap the night before surgery and the  morning of Surgery.  2.  If you choose to wash your hair, wash your hair first as usual with your  normal  shampoo.  3.  After you shampoo, rinse your hair and body thoroughly to remove the  shampoo.                           4.  Use CHG as you would any other liquid soap.  You can apply chg directly  to the skin and wash                       Gently with a scrungie or clean washcloth.  5.  Apply the CHG Soap to your body ONLY FROM THE NECK DOWN.   Do not use on  face/ open                           Wound or open sores. Avoid contact with eyes, ears mouth and genitals (private parts).                       Wash face,  Genitals (private parts) with your normal soap.             6.  Wash thoroughly, paying special attention to the area where your surgery  will be performed.  7.  Thoroughly rinse your body with warm water from the neck down.  8.  DO NOT shower/wash with your normal soap after using and rinsing off  the CHG Soap.                9.  Pat yourself dry with a clean towel.            10.  Wear clean pajamas.            11.  Place clean sheets on your bed the night of your first shower and do not  sleep with pets. Day of Surgery : Do not apply any lotions/deodorants the morning of surgery.  Please wear clean clothes to the hospital/surgery center.  FAILURE TO FOLLOW THESE INSTRUCTIONS MAY RESULT IN THE CANCELLATION OF YOUR SURGERY PATIENT SIGNATURE_________________________________  NURSE  SIGNATURE__________________________________  ________________________________________________________________________   Benjamin [name]  An incentive spirometer is a tool that can help keep your lungs clear and active. This tool measures how well you are filling your lungs with each breath. Taking long deep breaths may help reverse or decrease the chance of developing breathing (pulmonary) problems (especially infection) following:  A long period of time when you are unable to move or be active. BEFORE THE PROCEDURE   If the spirometer includes an indicator to show your best effort, your nurse or respiratory therapist will set it to a desired goal.  If possible, sit up straight or lean slightly forward. Try not to slouch.  Hold the incentive spirometer in an upright position. INSTRUCTIONS FOR USE   Sit on the edge of your bed if possible, or sit up as far as you can in bed or on a chair.  Hold the incentive spirometer in an upright position.  Breathe out normally.  Place the mouthpiece in your mouth and seal your lips tightly around it.  Breathe in slowly and as deeply as possible, raising the piston or the ball toward the top of the column.  Hold your breath for 3-5 seconds or for as long as possible. Allow the piston or ball to fall to the bottom of the column.  Remove the mouthpiece from your mouth and breathe out normally.  Rest for a few seconds and repeat Steps 1 through 7 at least 10 times every 1-2 hours when you are awake. Take your time and take a few normal breaths between deep breaths.  The spirometer may include an indicator to show your best effort. Use the indicator as a goal to work toward during each repetition.  After each set of 10 deep breaths, practice coughing to be sure your lungs are clear. If you have an incision (the cut made at the time of surgery), support your incision when coughing by placing a pillow or rolled up towels firmly against it. Once  you are able to get out of bed, walk around  indoors and cough well. You may stop using the incentive spirometer when instructed by your caregiver.  RISKS AND COMPLICATIONS  Take your time so you do not get dizzy or light-headed.  If you are in pain, you may need to take or ask for pain medication before doing incentive spirometry. It is harder to take a deep breath if you are having pain. AFTER USE  Rest and breathe slowly and easily.  It can be helpful to keep track of a log of your progress. Your caregiver can provide you with a simple table to help with this. If you are using the spirometer at home, follow these instructions: [place] IF:   You are having difficultly using the spirometer.  You have trouble using the spirometer as often as instructed.  Your pain medication is not giving enough relief while using the spirometer.  You develop fever of 100.5 F (38.1 C) or higher. SEEK IMMEDIATE MEDICAL CARE IF:   You cough up bloody sputum that had not been present before.  You develop fever of 102 F (38.9 C) or greater.  You develop worsening pain at or near the incision site. MAKE SURE YOU:   Understand these instructions.  Will watch your condition.  Will get help right away if you are not doing well or get worse. Document Released: 06/08/2006 Document Revised: 04/20/2011 Document Reviewed: 08/09/2006 Exit[employer] Patient Information 2014 [employer], [place].   ________________________________________________________________________  WHAT IS A BLOOD TRANSFUSION? Blood Transfusion Information  A transfusion is the replacement of blood or some of its parts. Blood is made up of multiple cells which provide different functions.  Red blood cells carry oxygen and are used for blood loss replacement.  White blood cells fight against infection.  Platelets control bleeding.  Plasma helps clot blood.  Other blood products are available for specialized needs, such as  hemophilia or other clotting disorders. BEFORE THE TRANSFUSION  Who gives blood for transfusions?   Healthy volunteers who are fully evaluated to make sure their blood is safe. This is blood bank blood. Transfusion therapy is the safest it has ever been in the practice of medicine. Before blood is taken from a donor, a complete history is taken to make sure that person has no history of diseases nor engages in risky social behavior (examples are intravenous drug use or sexual activity with multiple partners). The donor's travel history is screened to minimize risk of transmitting infections, such as malaria. The donated blood is tested for signs of infectious diseases, such as HIV and hepatitis. The blood is then tested to be sure it is compatible with you in order to minimize the chance of a transfusion reaction. If you or a relative donates blood, this is often done in anticipation of surgery and is not appropriate for emergency situations. It takes many days to process the donated blood. RISKS AND COMPLICATIONS Although transfusion therapy is very safe and saves many lives, the main dangers of transfusion include:   Getting an infectious disease.  Developing a transfusion reaction. This is an allergic reaction to something in the blood you were given. Every precaution is taken to prevent this. The decision to have a blood transfusion has been considered carefully by your caregiver before blood is given. Blood is not given unless the benefits outweigh the risks. AFTER THE TRANSFUSION  Right after receiving a blood transfusion, you will usually feel much better and more energetic. This is especially true if your red blood cells have gotten low (  anemic). The transfusion raises the level of the red blood cells which carry oxygen, and this usually causes an energy increase.  The nurse administering the transfusion will monitor you carefully for complications. HOME CARE INSTRUCTIONS  No special  instructions are needed after a transfusion. You may find your energy is better. Speak with your caregiver about any limitations on activity for underlying diseases you may have. SEEK MEDICAL CARE IF:   Your condition is not improving after your transfusion.  You develop redness or irritation at the intravenous (IV) site. SEEK IMMEDIATE MEDICAL CARE IF:  Any of the following symptoms occur over the next 12 hours:  Shaking chills.  You have a temperature by mouth above 102 F (38.9 C), not controlled by medicine.  Chest, back, or muscle pain.  People around you feel you are not acting correctly or are confused.  Shortness of breath or difficulty breathing.  Dizziness and fainting.  You get a rash or develop hives.  You have a decrease in urine output.  Your urine turns a dark color or changes to pink, red, or brown. Any of the following symptoms occur over the next 10 days:  You have a temperature by mouth above 102 F (38.9 C), not controlled by medicine.  Shortness of breath.  Weakness after normal activity.  The white part of the eye turns yellow (jaundice).  You have a decrease in the amount of urine or are urinating less often.  Your urine turns a dark color or changes to pink, red, or brown. Document Released: 01/24/2000 Document Revised: 04/20/2011 Document Reviewed: 09/12/2007 [facility] Patient Information 2014 [place], [place].  _______________________________________________________________________

## 2014-07-05 NOTE — Progress Notes (Signed)
Clearance note per chart per Dr Sarajane Jews 11/06/2013

## 2014-07-06 NOTE — H&P (Signed)
TOTAL KNEE ADMISSION H&P  Patient is being admitted for left total knee arthroplasty.  Subjective:  Chief Complaint:   Left knee primary OA / pain.  HPI: Benjamin [name], 74 y.o. male, has a history of pain and functional disability in the left knee due to arthritis and has failed non-surgical conservative treatments for greater than 12 weeks to includeNSAID's and/or analgesics, corticosteriod injections, viscosupplementation injections and activity modification.  Onset of symptoms was gradual, starting >10 years ago with gradually worsening course since that time. The patient noted prior procedures on the knee to include  arthroscopy on the left knee(s).  Patient currently rates pain in the left knee(s) at 7 out of 10 with activity. Patient has worsening of pain with activity and weight bearing, pain that interferes with activities of daily living, pain with passive range of motion, crepitus and joint swelling.  Patient has evidence of periarticular osteophytes and joint space narrowing by imaging studies.  There is no active infection.  Risks, benefits and expectations were discussed with the patient.  Risks including but not limited to the risk of anesthesia, blood clots, nerve damage, blood vessel damage, failure of the prosthesis, infection and up to and including death.  Patient understand the risks, benefits and expectations and wishes to proceed with surgery.   PCP: Laurey Morale, MD  D/C Plans:      SNF  [facility])  Post-op Meds:       No Rx given  Tranexamic Acid:      To be given - IV   Decadron:      Is to be given  FYI:     ASA post-op  Norco post-op    Patient Active Problem List   Diagnosis Date Noted  . S/P laparoscopic cholecystectomy Dec 2015 01/10/2014  . Unspecified hypothyroidism 07/10/2013  . Type II or unspecified type diabetes mellitus without mention of complication, not stated as uncontrolled 04/14/2011  . HYPOGONADISM 03/07/2010  . HEMATURIA UNSPECIFIED 01/08/2010   . KNEE PAIN, RIGHT 09/23/2009  . UMB HERNIA WITHOUT MENTION OBSTRUCTION/GANGRENE 08/16/2009  . ACUTE BRONCHITIS 06/24/2009  . 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 IRRITABLE BOWEL SYNDROME 09/03/2008  . MALAISE AND FATIGUE 08/17/2008  . ABDOMINAL BLOATING 08/17/2008  . FINGER SPRAIN 04/24/2008  . GERD 08/15/2007  . ANXIETY 02/16/2007  . BENIGN PROSTATIC HYPERTROPHY 02/16/2007  . 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 CHICKENPOX, HX OF 02/16/2007  . PROSTATE SPECIFIC ANTIGEN, ELEVATED 01/26/2007  . HYPERLIPIDEMIA 10/12/2006   Past Medical History  Diagnosis Date  . Hyperlipidemia   . BPH (benign prostatic hyperplasia)     with elevated PSA, sees Dr. Diona Fanti, benign  . Anxiety   . Insomnia   . GERD (gastroesophageal reflux disease)   . Elevated PSA   . Diabetes mellitus     borderline   . Arthritis     Past Surgical History  Procedure Laterality Date  . Prostate biopsies in 2003 and again 05/2008      per Dr. Diona Fanti. benign  . Right knee arthroscopy    . Reapir of large umblicial hernia      per Dr. Kaylyn Lim on 01/10/10  . Colonoscopy      02/28/10 per Dr. Carlean Purl, single adenomatous polyp, repeat in 5 yrs.   . Liposuction  1987  . Refractive surgery    . Cholecystectomy N/A 01/10/2014    Procedure: LAPAROSCOPIC CHOLECYSTECTOMY WITH INTRAOPERATIVE CHOLANGIOGRAM;  Surgeon: Pedro Earls, MD;  Location: [facility];  Service: General;  Laterality:  N/A;  . Tonsillectomy    . Left knee arthroscopy       No prescriptions prior to admission   No Known Allergies  History  Substance Use Topics  . Smoking status: Former Smoker -- 1.50 packs/day    Types: Cigarettes    Quit date: 02/09/1974  . Smokeless tobacco: Never Used  . Alcohol Use: 3.5 oz/week    7 Standard drinks or equivalent per week     Comment: daily - 8 ozs of scotch     Family History  Problem Relation Age of Onset  . Alzheimer's disease Other      Review of Systems  Constitutional:  Negative.   HENT: Negative.   Eyes: Negative.   Respiratory: Negative.   Cardiovascular: Negative.   Gastrointestinal: Positive for heartburn.  Genitourinary: Positive for frequency.  Musculoskeletal: Positive for joint pain.  Skin: Negative.   Neurological: Negative.   Endo/Heme/Allergies: Negative.   Psychiatric/Behavioral: The patient is nervous/anxious and has insomnia.     Objective:  Physical Exam  Constitutional: He is oriented to person, place, and time. He appears well-developed and well-nourished.  HENT:  Head: Normocephalic and atraumatic.  Eyes: Pupils are equal, round, and reactive to light.  Neck: Neck supple. No JVD present. No tracheal deviation present. No thyromegaly present.  Cardiovascular: Normal rate, regular rhythm, normal heart sounds and intact distal pulses.   Respiratory: Effort normal and breath sounds normal. No stridor. No respiratory distress. He has no wheezes.  GI: Soft. There is no tenderness. There is no guarding.  Musculoskeletal:       Left knee: He exhibits decreased range of motion, swelling and bony tenderness. He exhibits no ecchymosis, no deformity, no laceration and no erythema. Tenderness found.  Lymphadenopathy:    He has no cervical adenopathy.  Neurological: He is alert and oriented to person, place, and time.  Skin: Skin is warm and dry.  Psychiatric: He has a normal mood and affect.      Labs:  Estimated body mass index is 32.14 kg/(m^2) as calculated from the following:   Height as of 01/10/14: 6' (1.829 m).   Weight as of 02/12/14: 107.502 kg (237 lb).   Imaging Review Plain radiographs demonstrate moderate degenerative joint disease of the right knee(s). The overall alignment is neutral. The bone quality appears to be good for age and reported activity level.  Assessment/Plan:  End stage arthritis, left knee   The patient history, physical examination, clinical judgment of the provider and imaging studies are  consistent with end stage degenerative joint disease of the left knee(s) and total knee arthroplasty is deemed medically necessary. The treatment options including medical management, injection therapy arthroscopy and arthroplasty were discussed at length. The risks and benefits of total knee arthroplasty were presented and reviewed. The risks due to aseptic loosening, infection, stiffness, patella tracking problems, thromboembolic complications and other imponderables were discussed. The patient acknowledged the explanation, agreed to proceed with the plan and consent was signed. Patient is being admitted for inpatient treatment for surgery, pain control, PT, OT, prophylactic antibiotics, VTE prophylaxis, progressive ambulation and ADL's and discharge planning. The patient is planning to be discharged to skilled nursing facility.     West Pugh Hamsini Verrilli   PA-C  07/06/2014, 4:25 PM

## 2014-07-06 NOTE — Progress Notes (Addendum)
Received fax (placed on chart) from Dr Aurea Graff office noting that Dr Barbie Banner office has been made aware of pts platlet count from labs per PAT visit 07/05/2014 (in epic)

## 2014-07-06 NOTE — Progress Notes (Signed)
CBC results per epic per PAT visit 07/05/2014 sent to Dr Alvan Dame

## 2014-07-15 ENCOUNTER — Encounter (HOSPITAL_COMMUNITY): Payer: Self-pay | Admitting: Anesthesiology

## 2014-07-15 NOTE — Anesthesia Preprocedure Evaluation (Addendum)
Anesthesia Evaluation  Patient identified by MRN, date of birth, ID band Patient awake    Reviewed: Allergy & Precautions, NPO status , Patient's Chart, lab work & pertinent test results  Airway Mallampati: II  TM Distance: >3 FB Neck ROM: Full    Dental no notable dental hx.    Pulmonary former smoker,  breath sounds clear to auscultation  Pulmonary exam normal       Cardiovascular negative cardio ROS Normal cardiovascular examRhythm:Regular Rate:Normal     Neuro/Psych Anxiety negative neurological ROS     GI/Hepatic Neg liver ROS, GERD-  ,  Endo/Other  negative endocrine ROSdiabetes  Renal/GU negative Renal ROS  negative genitourinary   Musculoskeletal  (+) Arthritis -,   Abdominal   Peds negative pediatric ROS (+)  Hematology negative hematology ROS (+)   Anesthesia Other Findings   Reproductive/Obstetrics negative OB ROS                            Anesthesia Physical Anesthesia Plan  ASA: II  Anesthesia Plan: General   Post-op Pain Management:    Induction: Intravenous  Airway Management Planned: Oral ETT  Additional Equipment:   Intra-op Plan:   Post-operative Plan: Extubation in OR  Informed Consent: I have reviewed the patients History and Physical, chart, labs and discussed the procedure including the risks, benefits and alternatives for the proposed anesthesia with the patient or authorized representative who has indicated his/her understanding and acceptance.   Dental advisory given  Plan Discussed with: CRNA  Anesthesia Plan Comments: (H/O thrombocytopenia. Plan general.)       Anesthesia Quick Evaluation

## 2014-07-16 ENCOUNTER — Inpatient Hospital Stay (HOSPITAL_COMMUNITY): Payer: Medicare Other | Admitting: Anesthesiology

## 2014-07-16 ENCOUNTER — Inpatient Hospital Stay (HOSPITAL_COMMUNITY)
Admission: RE | Admit: 2014-07-16 | Discharge: 2014-07-18 | DRG: 470 | Disposition: A | Discharge status: Skilled Nursing Facility | Payer: Medicare Other | Source: Ambulatory Visit | Attending: Orthopedic Surgery | Admitting: Orthopedic Surgery

## 2014-07-16 ENCOUNTER — Encounter (HOSPITAL_COMMUNITY)
Admission: RE | Disposition: A | Discharge status: Skilled Nursing Facility | Payer: Self-pay | Source: Ambulatory Visit | Attending: Orthopedic Surgery

## 2014-07-16 ENCOUNTER — Encounter (HOSPITAL_COMMUNITY): Payer: Self-pay | Admitting: Anesthesiology

## 2014-07-16 DIAGNOSIS — E785 Hyperlipidemia, unspecified: Secondary | ICD-10-CM | POA: Diagnosis present

## 2014-07-16 DIAGNOSIS — N400 Enlarged prostate without lower urinary tract symptoms: Secondary | ICD-10-CM | POA: Diagnosis present

## 2014-07-16 DIAGNOSIS — Z9049 Acquired absence of other specified parts of digestive tract: Secondary | ICD-10-CM | POA: Diagnosis present

## 2014-07-16 DIAGNOSIS — N486 Induration penis plastica: Secondary | ICD-10-CM | POA: Diagnosis present

## 2014-07-16 DIAGNOSIS — F419 Anxiety disorder, unspecified: Secondary | ICD-10-CM | POA: Diagnosis present

## 2014-07-16 DIAGNOSIS — Z6832 Body mass index (BMI) 32.0-32.9, adult: Secondary | ICD-10-CM

## 2014-07-16 DIAGNOSIS — E039 Hypothyroidism, unspecified: Secondary | ICD-10-CM | POA: Diagnosis present

## 2014-07-16 DIAGNOSIS — M659 Synovitis and tenosynovitis, unspecified: Secondary | ICD-10-CM | POA: Diagnosis present

## 2014-07-16 DIAGNOSIS — G4700 Insomnia, unspecified: Secondary | ICD-10-CM | POA: Diagnosis present

## 2014-07-16 DIAGNOSIS — E119 Type 2 diabetes mellitus without complications: Secondary | ICD-10-CM | POA: Diagnosis present

## 2014-07-16 DIAGNOSIS — Z96652 Presence of left artificial knee joint: Secondary | ICD-10-CM

## 2014-07-16 DIAGNOSIS — K219 Gastro-esophageal reflux disease without esophagitis: Secondary | ICD-10-CM | POA: Diagnosis present

## 2014-07-16 DIAGNOSIS — E669 Obesity, unspecified: Secondary | ICD-10-CM | POA: Diagnosis present

## 2014-07-16 DIAGNOSIS — Z87891 Personal history of nicotine dependence: Secondary | ICD-10-CM

## 2014-07-16 DIAGNOSIS — K589 Irritable bowel syndrome without diarrhea: Secondary | ICD-10-CM | POA: Diagnosis present

## 2014-07-16 DIAGNOSIS — M1712 Unilateral primary osteoarthritis, left knee: Principal | ICD-10-CM | POA: Diagnosis present

## 2014-07-16 DIAGNOSIS — E291 Testicular hypofunction: Secondary | ICD-10-CM | POA: Diagnosis present

## 2014-07-16 HISTORY — PX: TOTAL KNEE ARTHROPLASTY: SHX125

## 2014-07-16 LAB — GLUCOSE, CAPILLARY: Glucose-Capillary: 138 mg/dL — ABNORMAL HIGH (ref 65–99)

## 2014-07-16 LAB — ABO/RH: ABO/RH(D): A POS

## 2014-07-16 LAB — TYPE AND SCREEN
ABO/RH(D): A POS
Antibody Screen: NEGATIVE

## 2014-07-16 SURGERY — ARTHROPLASTY, KNEE, TOTAL
Anesthesia: General | Site: Knee | Laterality: Left

## 2014-07-16 MED ORDER — SODIUM CHLORIDE 0.9 % IJ SOLN
INTRAMUSCULAR | Status: AC
  Filled 2014-07-16: qty 10

## 2014-07-16 MED ORDER — CHLORHEXIDINE GLUCONATE 4 % EX LIQD: 60.0000 mL | Freq: Once | CUTANEOUS | Status: DC

## 2014-07-16 MED ORDER — ONDANSETRON HCL 4 MG PO TABS: 4.0000 mg | ORAL_TABLET | Freq: Four times a day (QID) | ORAL | Status: DC | PRN

## 2014-07-16 MED ORDER — ROCURONIUM BROMIDE 100 MG/10ML IV SOLN
INTRAVENOUS | Status: DC | PRN
  Administered 2014-07-16: 40 mg via INTRAVENOUS

## 2014-07-16 MED ORDER — ONDANSETRON HCL 4 MG/2ML IJ SOLN
INTRAMUSCULAR | Status: AC
  Filled 2014-07-16: qty 2

## 2014-07-16 MED ORDER — KETOROLAC TROMETHAMINE 30 MG/ML IJ SOLN
INTRAMUSCULAR | Status: AC
  Filled 2014-07-16: qty 1

## 2014-07-16 MED ORDER — HYDROMORPHONE HCL 1 MG/ML IJ SOLN
INTRAMUSCULAR | Status: AC
  Filled 2014-07-16: qty 1

## 2014-07-16 MED ORDER — METOCLOPRAMIDE HCL 10 MG PO TABS: 5.0000 mg | ORAL_TABLET | Freq: Three times a day (TID) | ORAL | Status: DC | PRN

## 2014-07-16 MED ORDER — ARTIFICIAL TEARS OP OINT
TOPICAL_OINTMENT | OPHTHALMIC | Status: AC
  Filled 2014-07-16: qty 3.5

## 2014-07-16 MED ORDER — SUCCINYLCHOLINE CHLORIDE 20 MG/ML IJ SOLN
INTRAMUSCULAR | Status: DC | PRN
  Administered 2014-07-16: 140 mg via INTRAVENOUS

## 2014-07-16 MED ORDER — DOCUSATE SODIUM 100 MG PO CAPS
100.0000 mg | ORAL_CAPSULE | Freq: Two times a day (BID) | ORAL | Status: DC
  Administered 2014-07-16 – 2014-07-18 (×4): 100 mg via ORAL

## 2014-07-16 MED ORDER — LIDOCAINE HCL (CARDIAC) 20 MG/ML IV SOLN
INTRAVENOUS | Status: AC
  Filled 2014-07-16: qty 5

## 2014-07-16 MED ORDER — ASPIRIN EC 325 MG PO TBEC
325.0000 mg | DELAYED_RELEASE_TABLET | Freq: Two times a day (BID) | ORAL | Status: DC
  Administered 2014-07-17 – 2014-07-18 (×3): 325 mg via ORAL
  Filled 2014-07-16 (×5): qty 1

## 2014-07-16 MED ORDER — METHOCARBAMOL 500 MG PO TABS
500.0000 mg | ORAL_TABLET | Freq: Four times a day (QID) | ORAL | Status: DC | PRN
  Administered 2014-07-17 – 2014-07-18 (×2): 500 mg via ORAL
  Filled 2014-07-16 (×3): qty 1

## 2014-07-16 MED ORDER — SODIUM CHLORIDE 0.9 % IV SOLN
1000.0000 mg | Freq: Once | INTRAVENOUS | Status: AC
  Administered 2014-07-16: 1000 mg via INTRAVENOUS
  Filled 2014-07-16: qty 10

## 2014-07-16 MED ORDER — ALUM & MAG HYDROXIDE-SIMETH 200-200-20 MG/5ML PO SUSP: 30.0000 mL | ORAL | Status: DC | PRN

## 2014-07-16 MED ORDER — ONDANSETRON HCL 4 MG/2ML IJ SOLN
INTRAMUSCULAR | Status: DC | PRN
  Administered 2014-07-16: 4 mg via INTRAVENOUS

## 2014-07-16 MED ORDER — HYDROCODONE-ACETAMINOPHEN 7.5-325 MG PO TABS
1.0000 | ORAL_TABLET | ORAL | Status: DC
  Administered 2014-07-16 – 2014-07-17 (×3): 2 via ORAL
  Administered 2014-07-17: 1 via ORAL
  Administered 2014-07-17: 2 via ORAL
  Administered 2014-07-17 – 2014-07-18 (×4): 1 via ORAL
  Filled 2014-07-16: qty 2
  Filled 2014-07-16 (×2): qty 1
  Filled 2014-07-16 (×4): qty 2
  Filled 2014-07-16: qty 1

## 2014-07-16 MED ORDER — DEXAMETHASONE SODIUM PHOSPHATE 10 MG/ML IJ SOLN
10.0000 mg | Freq: Once | INTRAMUSCULAR | Status: AC
  Administered 2014-07-16: 10 mg via INTRAVENOUS

## 2014-07-16 MED ORDER — BISACODYL 10 MG RE SUPP
10.0000 mg | Freq: Every day | RECTAL | Status: DC | PRN
  Administered 2014-07-18: 10 mg via RECTAL
  Filled 2014-07-16: qty 1

## 2014-07-16 MED ORDER — ROPIVACAINE HCL 5 MG/ML IJ SOLN
INTRAMUSCULAR | Status: DC | PRN
  Administered 2014-07-16: 30 mL via PERINEURAL

## 2014-07-16 MED ORDER — DIPHENHYDRAMINE HCL 25 MG PO CAPS: 25.0000 mg | ORAL_CAPSULE | Freq: Four times a day (QID) | ORAL | Status: DC | PRN

## 2014-07-16 MED ORDER — FERROUS SULFATE 325 (65 FE) MG PO TABS
325.0000 mg | ORAL_TABLET | Freq: Three times a day (TID) | ORAL | Status: DC
  Administered 2014-07-16 – 2014-07-17 (×2): 325 mg via ORAL
  Filled 2014-07-16 (×8): qty 1

## 2014-07-16 MED ORDER — POLYETHYLENE GLYCOL 3350 17 G PO PACK
17.0000 g | PACK | Freq: Two times a day (BID) | ORAL | Status: DC
  Administered 2014-07-16 – 2014-07-17 (×2): 17 g via ORAL

## 2014-07-16 MED ORDER — CEFAZOLIN SODIUM-DEXTROSE 2-3 GM-% IV SOLR
INTRAVENOUS | Status: AC
  Filled 2014-07-16: qty 50

## 2014-07-16 MED ORDER — SODIUM CHLORIDE 0.9 % IJ SOLN
INTRAMUSCULAR | Status: DC | PRN
  Administered 2014-07-16: 29 mL

## 2014-07-16 MED ORDER — GLYCOPYRROLATE 0.2 MG/ML IJ SOLN
INTRAMUSCULAR | Status: AC
  Filled 2014-07-16: qty 2

## 2014-07-16 MED ORDER — PROPOFOL 10 MG/ML IV BOLUS
INTRAVENOUS | Status: DC | PRN
  Administered 2014-07-16: 150 mg via INTRAVENOUS

## 2014-07-16 MED ORDER — LIDOCAINE HCL (CARDIAC) 20 MG/ML IV SOLN
INTRAVENOUS | Status: DC | PRN
  Administered 2014-07-16: 50 mg via INTRAVENOUS

## 2014-07-16 MED ORDER — FENTANYL CITRATE (PF) 100 MCG/2ML IJ SOLN
INTRAMUSCULAR | Status: DC | PRN
  Administered 2014-07-16 (×5): 50 ug via INTRAVENOUS

## 2014-07-16 MED ORDER — DEXAMETHASONE SODIUM PHOSPHATE 10 MG/ML IJ SOLN
10.0000 mg | Freq: Once | INTRAMUSCULAR | Status: AC
  Administered 2014-07-17: 10 mg via INTRAVENOUS
  Filled 2014-07-16: qty 1

## 2014-07-16 MED ORDER — ROCURONIUM BROMIDE 100 MG/10ML IV SOLN
INTRAVENOUS | Status: AC
  Filled 2014-07-16: qty 1

## 2014-07-16 MED ORDER — PHENOL 1.4 % MT LIQD: 1.0000 | OROMUCOSAL | Status: DC | PRN

## 2014-07-16 MED ORDER — NEOSTIGMINE METHYLSULFATE 10 MG/10ML IV SOLN
INTRAVENOUS | Status: DC | PRN
  Administered 2014-07-16: 3 mg via INTRAVENOUS

## 2014-07-16 MED ORDER — EPHEDRINE SULFATE 50 MG/ML IJ SOLN
INTRAMUSCULAR | Status: AC
  Filled 2014-07-16: qty 1

## 2014-07-16 MED ORDER — CEFAZOLIN SODIUM-DEXTROSE 2-3 GM-% IV SOLR
2.0000 g | INTRAVENOUS | Status: AC
  Administered 2014-07-16: 2 g via INTRAVENOUS

## 2014-07-16 MED ORDER — BUPIVACAINE-EPINEPHRINE (PF) 0.25% -1:200000 IJ SOLN
INTRAMUSCULAR | Status: AC
  Filled 2014-07-16: qty 30

## 2014-07-16 MED ORDER — PROPOFOL 10 MG/ML IV BOLUS
INTRAVENOUS | Status: AC
  Filled 2014-07-16: qty 20

## 2014-07-16 MED ORDER — ALPRAZOLAM 0.5 MG PO TABS
0.5000 mg | ORAL_TABLET | Freq: Four times a day (QID) | ORAL | Status: DC | PRN
  Administered 2014-07-16 – 2014-07-18 (×7): 0.5 mg via ORAL
  Filled 2014-07-16 (×7): qty 1

## 2014-07-16 MED ORDER — KETOROLAC TROMETHAMINE 30 MG/ML IJ SOLN
INTRAMUSCULAR | Status: DC | PRN
  Administered 2014-07-16: 30 mg

## 2014-07-16 MED ORDER — HYDROMORPHONE HCL 1 MG/ML IJ SOLN
0.2500 mg | INTRAMUSCULAR | Status: DC | PRN
  Administered 2014-07-16: 0.5 mg via INTRAVENOUS

## 2014-07-16 MED ORDER — METHOCARBAMOL 1000 MG/10ML IJ SOLN
500.0000 mg | Freq: Four times a day (QID) | INTRAVENOUS | Status: DC | PRN
  Administered 2014-07-16: 500 mg via INTRAVENOUS
  Filled 2014-07-16 (×2): qty 5

## 2014-07-16 MED ORDER — MIDAZOLAM HCL 2 MG/2ML IJ SOLN
INTRAMUSCULAR | Status: AC
  Filled 2014-07-16: qty 2

## 2014-07-16 MED ORDER — SODIUM CHLORIDE 0.9 % IJ SOLN
INTRAMUSCULAR | Status: AC
Start: 2014-07-16 — End: 2014-07-16
  Filled 2014-07-16: qty 50

## 2014-07-16 MED ORDER — EPHEDRINE SULFATE 50 MG/ML IJ SOLN
INTRAMUSCULAR | Status: DC | PRN
  Administered 2014-07-16 (×4): 5 mg via INTRAVENOUS

## 2014-07-16 MED ORDER — MAGNESIUM CITRATE PO SOLN: 1.0000 | Freq: Once | ORAL | Status: AC | PRN

## 2014-07-16 MED ORDER — DEXAMETHASONE SODIUM PHOSPHATE 10 MG/ML IJ SOLN
INTRAMUSCULAR | Status: AC
  Filled 2014-07-16: qty 1

## 2014-07-16 MED ORDER — SODIUM CHLORIDE 0.9 % IV SOLN
INTRAVENOUS | Status: DC
  Administered 2014-07-16 – 2014-07-17 (×2): via INTRAVENOUS
  Filled 2014-07-16 (×8): qty 1000

## 2014-07-16 MED ORDER — HYDROMORPHONE HCL 2 MG/ML IJ SOLN
INTRAMUSCULAR | Status: AC
  Filled 2014-07-16: qty 1

## 2014-07-16 MED ORDER — ROPIVACAINE HCL 5 MG/ML IJ SOLN
INTRAMUSCULAR | Status: AC
  Filled 2014-07-16: qty 30

## 2014-07-16 MED ORDER — BUPIVACAINE-EPINEPHRINE (PF) 0.25% -1:200000 IJ SOLN
INTRAMUSCULAR | Status: DC | PRN
  Administered 2014-07-16: 30 mL

## 2014-07-16 MED ORDER — FENTANYL CITRATE (PF) 250 MCG/5ML IJ SOLN
INTRAMUSCULAR | Status: AC
  Filled 2014-07-16: qty 5

## 2014-07-16 MED ORDER — HYDROMORPHONE HCL 1 MG/ML IJ SOLN
INTRAMUSCULAR | Status: DC | PRN
  Administered 2014-07-16: .6 mg via INTRAVENOUS
  Administered 2014-07-16 (×2): 0.5 mg via INTRAVENOUS
  Administered 2014-07-16: .4 mg via INTRAVENOUS

## 2014-07-16 MED ORDER — ONDANSETRON HCL 4 MG/2ML IJ SOLN
4.0000 mg | Freq: Four times a day (QID) | INTRAMUSCULAR | Status: DC | PRN
  Administered 2014-07-16 – 2014-07-17 (×2): 4 mg via INTRAVENOUS
  Filled 2014-07-16 (×2): qty 2

## 2014-07-16 MED ORDER — PANTOPRAZOLE SODIUM 40 MG PO TBEC
80.0000 mg | DELAYED_RELEASE_TABLET | Freq: Two times a day (BID) | ORAL | Status: DC | PRN
  Administered 2014-07-17: 80 mg via ORAL
  Filled 2014-07-16: qty 2

## 2014-07-16 MED ORDER — GLYCOPYRROLATE 0.2 MG/ML IJ SOLN
INTRAMUSCULAR | Status: DC | PRN
  Administered 2014-07-16: 0.2 mg via INTRAVENOUS
  Administered 2014-07-16: 0.4 mg via INTRAVENOUS

## 2014-07-16 MED ORDER — 0.9 % SODIUM CHLORIDE (POUR BTL) OPTIME
TOPICAL | Status: DC | PRN
  Administered 2014-07-16: 1000 mL

## 2014-07-16 MED ORDER — LACTATED RINGERS IV SOLN
INTRAVENOUS | Status: DC | PRN
  Administered 2014-07-16 (×2): via INTRAVENOUS

## 2014-07-16 MED ORDER — SODIUM CHLORIDE 0.9 % IR SOLN
Status: DC | PRN
  Administered 2014-07-16: 1000 mL

## 2014-07-16 MED ORDER — CELECOXIB 200 MG PO CAPS
200.0000 mg | ORAL_CAPSULE | Freq: Two times a day (BID) | ORAL | Status: DC
  Administered 2014-07-17 – 2014-07-18 (×3): 200 mg via ORAL
  Filled 2014-07-16 (×5): qty 1

## 2014-07-16 MED ORDER — NEOSTIGMINE METHYLSULFATE 10 MG/10ML IV SOLN
INTRAVENOUS | Status: AC
  Filled 2014-07-16: qty 1

## 2014-07-16 MED ORDER — MENTHOL 3 MG MT LOZG: 1.0000 | LOZENGE | OROMUCOSAL | Status: DC | PRN

## 2014-07-16 MED ORDER — MIDAZOLAM HCL 5 MG/5ML IJ SOLN
INTRAMUSCULAR | Status: DC | PRN
  Administered 2014-07-16: 1 mg via INTRAVENOUS

## 2014-07-16 MED ORDER — HYDROMORPHONE HCL 1 MG/ML IJ SOLN
0.5000 mg | INTRAMUSCULAR | Status: DC | PRN
  Administered 2014-07-16 (×2): 1 mg via INTRAVENOUS
  Filled 2014-07-16 (×2): qty 1

## 2014-07-16 MED ORDER — CEFAZOLIN SODIUM-DEXTROSE 2-3 GM-% IV SOLR
2.0000 g | Freq: Four times a day (QID) | INTRAVENOUS | Status: AC
  Administered 2014-07-16 (×2): 2 g via INTRAVENOUS
  Filled 2014-07-16 (×2): qty 50

## 2014-07-16 MED ORDER — METOCLOPRAMIDE HCL 5 MG/ML IJ SOLN: 5.0000 mg | Freq: Three times a day (TID) | INTRAMUSCULAR | Status: DC | PRN

## 2014-07-16 MED ORDER — HYDROMORPHONE HCL 1 MG/ML IJ SOLN: 0.2500 mg | INTRAMUSCULAR | Status: DC | PRN

## 2014-07-16 SURGICAL SUPPLY — 61 items
BAG SPEC THK2 15X12 ZIP CLS (MISCELLANEOUS)
BAG ZIPLOCK 12X15 (MISCELLANEOUS) IMPLANT
BANDAGE ELASTIC 6 VELCRO ST LF (GAUZE/BANDAGES/DRESSINGS) ×2 IMPLANT
BANDAGE ESMARK 6X9 LF (GAUZE/BANDAGES/DRESSINGS) ×1 IMPLANT
BLADE SAW SGTL 13.0X1.19X90.0M (BLADE) ×2 IMPLANT
BNDG CMPR 9X6 STRL LF SNTH (GAUZE/BANDAGES/DRESSINGS) ×1
BNDG ESMARK 6X9 LF (GAUZE/BANDAGES/DRESSINGS) ×2
BONE CEMENT GENTAMICIN (Cement) ×4 IMPLANT
BOWL SMART MIX CTS (DISPOSABLE) ×2 IMPLANT
CAPT KNEE TOTAL 3 ATTUNE ×1 IMPLANT
CEMENT BONE GENTAMICIN 40 (Cement) IMPLANT
CUFF TOURN SGL QUICK 34 (TOURNIQUET CUFF) ×2
CUFF TRNQT CYL 34X4X40X1 (TOURNIQUET CUFF) ×1 IMPLANT
DECANTER SPIKE VIAL GLASS SM (MISCELLANEOUS) ×2 IMPLANT
DRAPE EXTREMITY T 121X128X90 (DRAPE) ×2 IMPLANT
DRAPE POUCH INSTRU U-SHP 10X18 (DRAPES) ×2 IMPLANT
DRAPE U-SHAPE 47X51 STRL (DRAPES) ×2 IMPLANT
DRSG AQUACEL AG ADV 3.5X10 (GAUZE/BANDAGES/DRESSINGS) ×2 IMPLANT
DURAPREP 26ML APPLICATOR (WOUND CARE) ×4 IMPLANT
ELECT REM PT RETURN 9FT ADLT (ELECTROSURGICAL) ×2
ELECTRODE REM PT RTRN 9FT ADLT (ELECTROSURGICAL) ×1 IMPLANT
FACESHIELD WRAPAROUND (MASK) ×10 IMPLANT
FACESHIELD WRAPAROUND OR TEAM (MASK) ×5 IMPLANT
GLOVE BIO SURGEON STRL SZ7.5 (GLOVE) ×1 IMPLANT
GLOVE BIOGEL PI IND STRL 6.5 (GLOVE) IMPLANT
GLOVE BIOGEL PI IND STRL 7.5 (GLOVE) ×1 IMPLANT
GLOVE BIOGEL PI IND STRL 8.5 (GLOVE) ×1 IMPLANT
GLOVE BIOGEL PI INDICATOR 6.5 (GLOVE) ×1
GLOVE BIOGEL PI INDICATOR 7.5 (GLOVE) ×3
GLOVE BIOGEL PI INDICATOR 8.5 (GLOVE) ×1
GLOVE ECLIPSE 8.0 STRL XLNG CF (GLOVE) ×2 IMPLANT
GLOVE ORTHO TXT STRL SZ7.5 (GLOVE) ×4 IMPLANT
GLOVE SURG SS PI 7.5 STRL IVOR (GLOVE) ×1 IMPLANT
GOWN BRE IMP PREV XXLGXLNG (GOWN DISPOSABLE) ×1 IMPLANT
GOWN SPEC L3 XXLG W/TWL (GOWN DISPOSABLE) ×2 IMPLANT
GOWN STRL REUS W/TWL LRG LVL3 (GOWN DISPOSABLE) ×2 IMPLANT
GOWN STRL REUS W/TWL XL LVL3 (GOWN DISPOSABLE) ×1 IMPLANT
HANDPIECE INTERPULSE COAX TIP (DISPOSABLE) ×2
KIT BASIN OR (CUSTOM PROCEDURE TRAY) ×2 IMPLANT
LIQUID BAND (GAUZE/BANDAGES/DRESSINGS) ×2 IMPLANT
MANIFOLD NEPTUNE II (INSTRUMENTS) ×2 IMPLANT
NDL SAFETY ECLIPSE 18X1.5 (NEEDLE) ×1 IMPLANT
NEEDLE HYPO 18GX1.5 SHARP (NEEDLE) ×2
PACK TOTAL JOINT (CUSTOM PROCEDURE TRAY) ×2 IMPLANT
PEN SKIN MARKING BROAD (MISCELLANEOUS) ×2 IMPLANT
POSITIONER SURGICAL ARM (MISCELLANEOUS) ×2 IMPLANT
SET HNDPC FAN SPRY TIP SCT (DISPOSABLE) ×1 IMPLANT
SET PAD KNEE POSITIONER (MISCELLANEOUS) ×2 IMPLANT
SUCTION FRAZIER 12FR DISP (SUCTIONS) ×2 IMPLANT
SUT MNCRL AB 4-0 PS2 18 (SUTURE) ×2 IMPLANT
SUT VIC AB 1 CT1 36 (SUTURE) ×2 IMPLANT
SUT VIC AB 2-0 CT1 27 (SUTURE) ×6
SUT VIC AB 2-0 CT1 TAPERPNT 27 (SUTURE) ×3 IMPLANT
SUT VLOC 180 0 24IN GS25 (SUTURE) ×2 IMPLANT
SYR 50ML LL SCALE MARK (SYRINGE) ×2 IMPLANT
TOWEL OR 17X26 10 PK STRL BLUE (TOWEL DISPOSABLE) ×2 IMPLANT
TOWEL OR NON WOVEN STRL DISP B (DISPOSABLE) ×1 IMPLANT
TRAY FOLEY W/METER SILVER 16FR (SET/KITS/TRAYS/PACK) ×1 IMPLANT
WATER STERILE IRR 1500ML POUR (IV SOLUTION) ×2 IMPLANT
WRAP KNEE MAXI GEL POST OP (GAUZE/BANDAGES/DRESSINGS) ×2 IMPLANT
YANKAUER SUCT BULB TIP 10FT TU (MISCELLANEOUS) ×2 IMPLANT

## 2014-07-16 NOTE — Progress Notes (Signed)
AssistedDr. Delma Post with left, adductor canal block. Side rails up, monitors on throughout procedure. See vital signs in flow sheet. Tolerated Procedure well.

## 2014-07-16 NOTE — Anesthesia Postprocedure Evaluation (Signed)
  Anesthesia Post-op Note  Patient: Benjamin [name]  Procedure(s) Performed: Procedure(s) (LRB): LEFT TOTAL KNEE ARTHROPLASTY (Left)  Patient Location: PACU  Anesthesia Type: general with adductor canal block  Level of Consciousness: awake and alert   Airway and Oxygen Therapy: Patient Spontanous Breathing  Post-op Pain: mild  Post-op Assessment: Post-op Vital signs reviewed, Patient's Cardiovascular Status Stable, Respiratory Function Stable, Patent Airway and No signs of Nausea or vomiting  Last Vitals:  Filed Vitals:   07/16/14 1457  BP: 143/71  Pulse: 60  Temp: 36.7 C  Resp: 16    Post-op Vital Signs: stable   Complications: No apparent anesthesia complications

## 2014-07-16 NOTE — Anesthesia Procedure Notes (Addendum)
Procedure Name: Intubation Date/Time: 07/16/2014 9:35 AM Performed by: KEY, Virgel Gess Pre-anesthesia Checklist: Patient identified, Emergency Drugs available, Suction available, Patient being monitored and Timeout performed Patient Re-evaluated:Patient Re-evaluated prior to inductionOxygen Delivery Method: Circle system utilized Preoxygenation: Pre-oxygenation with 100% oxygen Intubation Type: IV induction Ventilation: Mask ventilation without difficulty Laryngoscope Size: Mac and 4 Grade View: Grade II Tube type: Oral Tube size: 7.5 mm Number of attempts: 1 Airway Equipment and Method: Stylet Placement Confirmation: ETT inserted through vocal cords under direct vision,  positive ETCO2,  CO2 detector and breath sounds checked- equal and bilateral Secured at: 23 cm Tube secured with: Tape Dental Injury: Teeth and Oropharynx as per pre-operative assessment  Difficulty Due To: Difficulty was anticipated and Difficult Airway- due to anterior larynx Future Recommendations: Recommend- induction with short-acting agent, and alternative techniques readily available Comments: Easy mask with oral AW.  Easy pass first attempt.  Anterior view.   Anesthesia Regional Block:  Adductor canal block  Pre-Anesthetic Checklist: ,, timeout performed, Correct Patient, Correct Site, Correct Laterality, Correct Procedure, Correct Position, site marked, Risks and benefits discussed,  Surgical consent,  Pre-op evaluation,  At surgeon's request and post-op pain management  Laterality: Lower  Prep: chloraprep       Needles:  Injection technique: Single-shot  Needle Type: Stimulator Needle - 40      Needle Gauge: 21 and 21 G    Additional Needles:  Procedures: ultrasound guided (picture in chart) and nerve stimulator Adductor canal block  Nerve Stimulator or Paresthesia:  Response: 0.6 mA,   Additional Responses:   Narrative:  Injection made incrementally with aspirations every 5  mL.  Performed by: Personally   Additional Notes: No pain on injection. No increased resistance to injection. Motor intact immediately after block. No pain and no increased resistance to injection. Tolerated well. Meaningful verbal contact maintained throughout block placement.

## 2014-07-16 NOTE — Op Note (Signed)
NAME:  Benjamin [name]                      MEDICAL RECORD NO.:  [id number]                             FACILITY:  [facility]      PHYSICIAN:  Pietro Cassis. Alvan Dame, M.D.  DATE OF BIRTH:  [birth year]      DATE OF PROCEDURE:  07/16/2014                                     OPERATIVE REPORT         PREOPERATIVE DIAGNOSIS:  Left knee osteoarthritis.      POSTOPERATIVE DIAGNOSIS:  Left knee osteoarthritis.      FINDINGS:  The patient was noted to have complete loss of cartilage and   bone-on-bone arthritis with associated osteophytes in the medial and patellofemoral compartments of   the knee with a significant synovitis and associated effusion.      PROCEDURE:  Left total knee replacement.      COMPONENTS USED:  DePuy Attune rotating platform posterior stabilized knee   system, a size 7 femur, 6 tibia, size 6 mm PS AOX insert, and 41 anatomic AOX patellar   button.      SURGEON:  Pietro Cassis. Alvan Dame, M.D.      ASSISTANT:  Danae Orleans, PA-C.      ANESTHESIA:  General and Regional.      SPECIMENS:  None.      COMPLICATION:  None.      DRAINS:  One Hemovac.  EBL: <50cc      TOURNIQUET TIME:   Total Tourniquet Time Documented: Thigh (Left) - 38 minutes Total: Thigh (Left) - 38 minutes  .      The patient was stable to the recovery room.      INDICATION FOR PROCEDURE:  Benjamin [name] is a 74 y.o. male patient of   mine.  The patient had been seen, evaluated, and treated conservatively in the   office with medication, activity modification, and injections.  The patient had   radiographic changes of bone-on-bone arthritis with endplate sclerosis and osteophytes noted.      The patient failed conservative measures including medication, injections, and activity modification, and at this point was ready for more definitive measures.   Based on the radiographic changes and failed conservative measures, the patient   decided to proceed with total knee replacement.  Risks of infection,   DVT,  component failure, need for revision surgery, postop course, and   expectations were all   discussed and reviewed.  Consent was obtained for benefit of pain   relief.      PROCEDURE IN DETAIL:  The patient was brought to the operative theater.   Once adequate anesthesia, preoperative antibiotics, 2 gm of Ancef, 1gm of Tranexamic Acid, 10mg  of Decadron administered, the patient was positioned supine with the left thigh tourniquet placed.  The  left lower extremity was prepped and draped in sterile fashion.  A time-   out was performed identifying the patient, planned procedure, and   extremity.      The left lower extremity was placed in the [facility] leg holder.  The leg was   exsanguinated, tourniquet elevated to 250 mmHg.  A midline incision was   made followed  by median parapatellar arthrotomy.  Following initial   exposure, attention was first directed to the patella.  Precut   measurement was noted to be 26 mm.  I resected down to 14-15 mm and used a   33mm patellar button to restore patellar height as well as cover the cut   surface.      The lug holes were drilled and a metal shim was placed to protect the   patella from retractors and saw blades.      At this point, attention was now directed to the femur.  The femoral   canal was opened with a drill, irrigated to try to prevent fat emboli.  An   intramedullary rod was passed at 5 degrees valgus, 9 mm of bone was   resected off the distal femur.  Following this resection, the tibia was   subluxated anteriorly.  Using the extramedullary guide, 2 mm of bone was resected off   the proximal medial tibia.  We confirmed the gap would be   stable medially and laterally with a size 6 mm insert as well as confirmed   the cut was perpendicular in the coronal plane, checking with an alignment rod.      Once this was done, I sized the femur to be a size 7 in the anterior-   posterior dimension, chose a standard component based on medial and    lateral dimension.  The size 7 rotation block was then pinned in   position anterior referenced using the C-clamp to set rotation.  The   anterior, posterior, and  chamfer cuts were made without difficulty nor   notching making certain that I was along the anterior cortex to help   with flexion gap stability.      The final box cut was made off the lateral aspect of distal femur.      At this point, the tibia was sized to be a size 6, the size 6 tray was   then pinned in position through the medial third of the tubercle,   drilled, and keel punched.  Trial reduction was now carried with a 7 femur,  7 tibia, a size 6 mm insert, and the 41 patella botton.  The knee was brought to   extension, full extension with good flexion stability with the patella   tracking through the trochlea without application of pressure.  Given   all these findings, the trial components removed.  Final components were   opened and cement was mixed.  The knee was irrigated with normal saline   solution and pulse lavage.  The synovial lining was   then injected with 30cc of 0.25% Marcaine with epinephrine and 1 cc of Toradol plus 30cc of NS for a  total of 61 cc.      The knee was irrigated.  Final implants were then cemented onto clean and   dried cut surfaces of bone with the knee brought to extension with a size 6 mm trial insert.      Once the cement had fully cured, the excess cement was removed   throughout the knee.  I confirmed I was satisfied with the range of   motion and stability, and the final size 6 mm PS insert was chosen.  It was   placed into the knee.      The tourniquet had been let down at 38 minutes.  No significant   hemostasis required.  The   extensor mechanism was  then reapproximated using #1 Vicryl with the knee   in flexion.  The   remaining wound was closed with 2-0 Vicryl and running 4-0 Monocryl.   The knee was cleaned, dried, dressed sterilely using Dermabond and   Aquacel  dressing.  The patient was then   brought to recovery room in stable condition, tolerating the procedure   well.   Please note that Physician Assistant, Danae Orleans, PA-C, was present for the entirety of the case, and was utilized for pre-operative positioning, peri-operative retractor management, general facilitation of the procedure.  He was also utilized for primary wound closure at the end of the case.              Pietro Cassis Alvan Dame, M.D.    07/16/2014 11:00 AM

## 2014-07-16 NOTE — Interval H&P Note (Signed)
History and Physical Interval Note:  07/16/2014 8:29 AM  Benjamin [name]  has presented today for surgery, with the diagnosis of left knee osteoarthritis  The various methods of treatment have been discussed with the patient and family. After consideration of risks, benefits and other options for treatment, the patient has consented to  Procedure(s): LEFT TOTAL KNEE ARTHROPLASTY (Left) as a surgical intervention .  The patient's history has been reviewed, patient examined, no change in status, stable for surgery.  I have reviewed the patient's chart and labs.  Questions were answered to the patient's satisfaction.     Mauri Pole

## 2014-07-16 NOTE — Transfer of Care (Signed)
Immediate Anesthesia Transfer of Care Note  Patient: Saahas [name]  Procedure(s) Performed: Procedure(s): LEFT TOTAL KNEE ARTHROPLASTY (Left)  Patient Location: PACU  Anesthesia Type:GA combined with regional for post-op pain  Level of Consciousness: awake, alert  and patient cooperative  Airway & Oxygen Therapy: Patient Spontanous Breathing and Patient connected to face mask oxygen  Post-op Assessment: Report given to RN and Post -op Vital signs reviewed and stable  Post vital signs: Reviewed and stable  Last Vitals:  Filed Vitals:   07/16/14 1131  BP:   Pulse: 75  Temp:   Resp:     Complications: No apparent anesthesia complications

## 2014-07-17 ENCOUNTER — Encounter (HOSPITAL_COMMUNITY): Payer: Self-pay | Admitting: Orthopedic Surgery

## 2014-07-17 LAB — BASIC METABOLIC PANEL
Anion gap: 7 (ref 5–15)
BUN: 15 mg/dL (ref 6–20)
CO2: 26 mmol/L (ref 22–32)
Calcium: 8.5 mg/dL — ABNORMAL LOW (ref 8.9–10.3)
Chloride: 105 mmol/L (ref 101–111)
Creatinine, Ser: 0.84 mg/dL (ref 0.61–1.24)
GFR calc Af Amer: >60 mL/min (ref >60)
Glucose, Bld: 136 mg/dL — ABNORMAL HIGH (ref 65–99)
Potassium: 4.4 mmol/L (ref 3.5–5.1)
SODIUM: 138 mmol/L (ref 135–145)

## 2014-07-17 LAB — CBC
HEMATOCRIT: 37.4 % — AB (ref 39.0–52.0)
Hemoglobin: 13 g/dL (ref 13.0–17.0)
MCH: 32.7 pg (ref 26.0–34.0)
MCHC: 34.8 g/dL (ref 30.0–36.0)
MCV: 94 fL (ref 78.0–100.0)
Platelets: 131 10*3/uL — ABNORMAL LOW (ref 150–400)
RBC: 3.98 MIL/uL — AB (ref 4.22–5.81)
RDW: 13.4 % (ref 11.5–15.5)
WBC: 7.7 10*3/uL (ref 4.0–10.5)

## 2014-07-17 NOTE — Evaluation (Signed)
Occupational Therapy Evaluation Patient Details Name: Benjamin [name] MRN: [id number] DOB: 1940-09-17 Today's Date: 07/17/2014    History of Present Illness L TKR   Clinical Impression   This 74 year old man was admitted for the above surgery.  At baseline, he is independent with ADLs.  Evaluation was limited today due to anxiety/feeling lightheaded:  Vitals WFLs.  Goals in acute are for supervision to min guard levels.    Follow Up Recommendations  SNF    Equipment Recommendations  3 in 1 bedside comode (possibly)    Recommendations for Other Services       Precautions / Restrictions Precautions Precautions: Fall;Knee Restrictions Weight Bearing Restrictions: No Other Position/Activity Restrictions: WBAT      Mobility Bed Mobility            General bed mobility comments: OOB  Transfers             General transfer comment: not performed:  min A with PT    Balance                                            ADL Overall ADL's : Needs assistance/impaired     Grooming: Set up;Sitting   Upper Body Bathing: Set up;Sitting   Lower Body Bathing: Minimal assistance;Sit to/from stand   Upper Body Dressing : Set up;Sitting   Lower Body Dressing: Moderate assistance;Sit to/from stand                 General ADL Comments: evaluation limited to sitting as pt felt a little dizzy, like he could pass out.  Described "like hyperventilating".  02 sats 95-97% on RA.  HR 50s and BP 158/73. Stood with PT with min A earlier today.  Pt has 2 reachers at home.  Educated on uses for ADLs.  He has grab bars in bathroom by shower and standard commodes.  Educated on no pillow under knees     Vision     Perception     Praxis      Pertinent Vitals/Pain Pain Assessment: 0-10 Pain Score: 2  Pain Location: L knee Pain Descriptors / Indicators: Sore Pain Intervention(s): Limited activity within patient's tolerance;Monitored during  session;Repositioned;Premedicated before session;Ice applied     Hand Dominance     Extremity/Trunk Assessment Upper Extremity Assessment Upper Extremity Assessment: Overall WFL for tasks assessed      Cervical / Trunk Assessment Cervical / Trunk Assessment: Normal   Communication Communication Communication: No difficulties   Cognition Arousal/Alertness: Awake/alert Behavior During Therapy: Anxious Overall Cognitive Status: Within Functional Limits for tasks assessed                     General Comments       Exercises      Shoulder Instructions      Home Living Family/patient expects to be discharged to:: Skilled nursing facility                                 Additional Comments: wife cannot assist due to her health problems      Prior Functioning/Environment Level of Independence: Independent             OT Diagnosis: Generalized weakness   OT Problem List: Decreased strength;Decreased activity tolerance;Pain;Decreased knowledge of use of DME or  AE   OT Treatment/Interventions: Self-care/ADL training;DME and/or AE instruction;Patient/family education;Balance training    OT Goals(Current goals can be found in the care plan section) Acute Rehab OT Goals Patient Stated Goal: Resume previous lifestyle with decreased pain OT Goal Formulation: With patient Time For Goal Achievement: 07/24/14 Potential to Achieve Goals: Good ADL Goals Pt Will Perform Grooming: with supervision;standing Pt Will Transfer to Toilet: with min guard assist;ambulating;bedside commode Pt Will Perform Toileting - Clothing Manipulation and hygiene: with supervision;sit to/from stand Additional ADL Goal #1: pt will perform LB bathing and don pants with reacher at supervision level, sit to stand  OT Frequency: Min 2X/week   Barriers to D/C:            Co-evaluation              End of Session    Activity Tolerance:  (limited eval due to  anxiety) Patient left: in chair;with call bell/phone within reach   Time: 7829-5621 OT Time Calculation (min): 22 min Charges:  OT General Charges $OT Visit: 1 Procedure OT Evaluation $Initial OT Evaluation Tier I: 1 Procedure G-Codes:    Decoda Van July 21, 2014, 10:20 AM Lesle Chris, OTR/L [phone] 21-Jul-2014

## 2014-07-17 NOTE — Progress Notes (Signed)
Physical Therapy Treatment Patient Details Name: Taishaun Levels MRN: [id number] DOB: 1940/11/05 Today's Date: 07/17/2014    History of Present Illness L TKR    PT Comments    POD # 1 pm session.  Assisted with standing pt to void.  Amb a limited distance in hallway then returned to bed to perform TKR TE's followed by ICE.  Pt needed to void again, so assisted OOB to standing then back to bed.   Follow Up Recommendations  SNF ([facility])  Pt progressing slowly and will need ST Rehab at [facility].    Equipment Recommendations       Recommendations for Other Services       Precautions / Restrictions Precautions Precautions: Fall;Knee Restrictions Weight Bearing Restrictions: No Other Position/Activity Restrictions: WBAT    Mobility  Bed Mobility Overal bed mobility: Needs Assistance Bed Mobility: Supine to Sit;Sit to Supine     Supine to sit: Min assist     General bed mobility comments: support/guide L LE  Transfers Overall transfer level: Needs assistance Equipment used: Rolling walker (2 wheeled) Transfers: Sit to/from Stand Sit to Stand: Min guard;Min assist         General transfer comment: 25% Vc's on proper tech and hand placement.  50% VC's to advance L LE prior to sit.   Ambulation/Gait Ambulation/Gait assistance: Min guard;Min assist Ambulation Distance (Feet): 22 Feet Assistive device: Rolling walker (2 wheeled) Gait Pattern/deviations: Step-to pattern;Decreased stance time - left;Trunk flexed Gait velocity: decreased   General Gait Details: 25% VC's on proper sequencing, proper upright posture and safety with turn. Pt also demonstrates increased anxiety/fear of increasing pain.  Requires increased time and encouragement.    Stairs            Wheelchair Mobility    Modified Rankin (Stroke Patients Only)       Balance                                    Cognition Arousal/Alertness: Awake/alert Behavior During Therapy:  Anxious Overall Cognitive Status: Within Functional Limits for tasks assessed                      Exercises   Total Knee Replacement TE's 10 reps B LE ankle pumps 10 reps towel squeezes 10 reps knee presses 10 reps heel slides  10 reps SAQ's 10 reps SLR's 10 reps ABD Followed by ICE     General Comments        Pertinent Vitals/Pain Pain Assessment: 0-10 Pain Score: 3  Pain Location: L knee Pain Descriptors / Indicators: Sore Pain Intervention(s): Monitored during session;Repositioned;Ice applied;RN gave pain meds during session    Home Living                      Prior Function            PT Goals (current goals can now be found in the care plan section) Progress towards PT goals: Progressing toward goals    Frequency       PT Plan      Co-evaluation             End of Session Equipment Utilized During Treatment: Gait belt Activity Tolerance: Patient tolerated treatment well Patient left: in bed;with call bell/phone within reach     Time: 1450-1532 PT Time Calculation (min) (ACUTE ONLY): 42 min  Charges:  $  Gait Training: 8-22 mins $Therapeutic Exercise: 8-22 mins $Therapeutic Activity: 8-22 mins                    G Codes:      Rica Koyanagi  PTA [facility]  Acute  Rehab Pager      [phone]

## 2014-07-17 NOTE — Progress Notes (Signed)
Patient ID: Benjamin [name], male   DOB: 1940-03-21, 74 y.o.   MRN: [id number] Subjective: 1 Day Post-Op Procedure(s) (LRB): LEFT TOTAL KNEE ARTHROPLASTY (Left)    Patient reports pain as mild.  Not much pain last night.  Doing well  Objective:   VITALS:   Filed Vitals:   07/17/14 0551  BP: 130/66  Pulse: 51  Temp: 98.4 F (36.9 C)  Resp: 14    Neurovascular intact Incision: dressing C/D/I  LABS  Recent Labs  07/17/14 0430  HGB 13.0  HCT 37.4*  WBC 7.7  PLT 131*     Recent Labs  07/17/14 0430  NA 138  K 4.4  BUN 15  CREATININE 0.84  GLUCOSE 136*    No results for input(s): LABPT, INR in the last 72 hours.   Assessment/Plan: 1 Day Post-Op Procedure(s) (LRB): LEFT TOTAL KNEE ARTHROPLASTY (Left)   Advance diet Up with therapy Discharge to SNF probably tomorrow

## 2014-07-17 NOTE — Clinical Social Work Placement (Signed)
   CLINICAL SOCIAL WORK PLACEMENT  NOTE  Date:  07/17/2014  Patient Details  Name: Benjamin [name] MRN: [id number] Date of Birth: May 01, 1940  Clinical Social Work is seeking post-discharge placement for this patient at the [place] level of care (*CSW will initial, date and re-position this form in  chart as items are completed):  No   Patient/family provided with [place] Work Department's list of facilities offering this level of care within the geographic area requested by the patient (or if unable, by the patient's family).  Yes   Patient/family informed of their freedom to choose among providers that offer the needed level of care, that participate in Medicare, Medicaid or managed care program needed by the patient, have an available bed and are willing to accept the patient.  No   Patient/family informed of [place]'s ownership interest in [facility] and [facility], as well as of the fact that they are under no obligation to receive care at these facilities.  PASRR submitted to EDS on       PASRR number received on       Existing PASRR number confirmed on 07/17/14     FL2 transmitted to all facilities in geographic area requested by pt/family on 07/17/14     FL2 transmitted to all facilities within larger geographic area on       Patient informed that his/her managed care company has contracts with or will negotiate with certain facilities, including the following:        Yes   Patient/family informed of bed offers received.  Patient chooses bed at [facility]     Physician recommends and patient chooses bed at      Patient to be transferred to [facility] on  .  Patient to be transferred to facility by       Patient family notified on   of transfer.  Name of family member notified:        PHYSICIAN       Additional Comment:    _______________________________________________ Luretha Rued, LCSW 07/17/2014,  5:03 PM

## 2014-07-17 NOTE — Care Management Note (Signed)
Case Management Note  Patient Details  Name: Benjamin [name] MRN: [id number] Date of Birth: [birth year]  Subjective/Objective:                  LEFT TOTAL KNEE ARTHROPLASTY (Left)  Action/Plan: Discharge planning  Expected Discharge Date:  07/18/14               Expected Discharge Plan:  [place]  In-House Referral:     Discharge planning Services  CM Consult  Post Acute Care Choice:    Choice offered to:     DME Arranged:    DME Agency:     HH Arranged:    [place] Agency:     Status of Service:  Completed, signed off  Medicare Important Message Given:    Date Medicare IM Given:    Medicare IM give by:    Date Additional Medicare IM Given:    Additional Medicare Important Message give by:     If discussed at [place] of Stay Meetings, dates discussed:    Additional Comments: CM notes pt to go to SNF; CSW arranging [facility].  No other CM needs were communicated. Dellie Catholic, RN 07/17/2014, 11:58 AM

## 2014-07-17 NOTE — Clinical Social Work Note (Signed)
Clinical Social Work Assessment  Patient Details  Name: Benjamin [name] MRN: [id number] Date of Birth: 03/19/1940  Date of referral:  07/17/14               Reason for consult:  Facility Placement                Permission sought to share information with:    Permission granted to share information::     Name::        Agency::     Relationship::     Contact Information:     Housing/Transportation Living arrangements for the past 2 months:  Single Family Home Source of Information:  Patient Patient Interpreter Needed:  None Criminal Activity/Legal Involvement Pertinent to Current Situation/Hospitalization:    Significant Relationships:  Spouse Lives with:  Spouse Do you feel safe going back to the place where you live?   (SNF required) Need for family participation in patient care:  No (Coment)  Care giving concerns:  Pt care cannot be managed at home following surgery.   Social Worker assessment / plan:  Pt hospitalized on 07/16/14 for pre planned left total knee arthroplasty. Pt has made prior arrangements to have ST rehab at [facility] following hospital d/c. CSW has confirmed d/c plan. Blue medicare has provided authorization for SNF placement.   Employment status:  Retired Nurse, adult PT Recommendations:  [place] / Referral to community resources:  [place]  Patient/Family's Response to care:  Pt feels ST rebah is needed.  Patient/Family's Understanding of and Emotional Response to Diagnosis, Current Treatment, and Prognosis:  Pt is motivated to work with PT and is looking forward to rehab at [facility].  Emotional Assessment Appearance:  Appears stated age Attitude/Demeanor/Rapport:  Other (cooperative) Affect (typically observed):  Pleasant, Appropriate Orientation:  Oriented to Self, Oriented to Place, Oriented to  Time Alcohol / Substance use:  Not Applicable Psych involvement (Current and  /or in the community):  No (Comment)  Discharge Needs  Concerns to be addressed:  Discharge Planning Concerns Readmission within the last 30 days:  No Current discharge risk:  None Barriers to Discharge:  No Barriers Identified   Kyaira Trantham, Randall An, LCSW 07/17/2014, 4:59 PM

## 2014-07-17 NOTE — Evaluation (Signed)
Physical Therapy Evaluation Patient Details Name: Benjamin [name] MRN: [id number] DOB: 18-Oct-1940 Today's Date: 07/17/2014   History of Present Illness  L TKR  Clinical Impression  Pt s/p L TKR presents with decreased L LE strength/ROM and post op pain limiting functional mobility.  Pt would benefit from follow up rehab at SNF level to maximize IND prior to return home with ltd assist.    Follow Up Recommendations SNF    Equipment Recommendations  None recommended by PT    Recommendations for Other Services OT consult     Precautions / Restrictions Precautions Precautions: Fall;Knee Restrictions Weight Bearing Restrictions: No Other Position/Activity Restrictions: WBAT      Mobility  Bed Mobility Overal bed mobility: Needs Assistance Bed Mobility: Supine to Sit     Supine to sit: Min assist     General bed mobility comments: cues for sequence and use of R LE to self assist  Transfers Overall transfer level: Needs assistance Equipment used: Rolling walker (2 wheeled) Transfers: Sit to/from Stand Sit to Stand: Min assist         General transfer comment: cues for LE management and use of UEs to self assist  Ambulation/Gait Ambulation/Gait assistance: Min assist Ambulation Distance (Feet): 19 Feet Assistive device: Rolling walker (2 wheeled) Gait Pattern/deviations: Step-to pattern;Decreased step length - right;Decreased step length - left;Shuffle;Trunk flexed Gait velocity: decr   General Gait Details: cues for sequence, posture and position from [employer]            Wheelchair Mobility    Modified Rankin (Stroke Patients Only)       Balance                                             Pertinent Vitals/Pain Pain Assessment: 0-10 Pain Score: 3  Pain Location: L knee Pain Descriptors / Indicators: Aching;Sore Pain Intervention(s): Limited activity within patient's tolerance;Monitored during session;Premedicated before  session;Ice applied    Home Living Family/patient expects to be discharged to:: Skilled nursing facility                      Prior Function Level of Independence: Independent               Hand Dominance        Extremity/Trunk Assessment   Upper Extremity Assessment: Overall WFL for tasks assessed           Lower Extremity Assessment: LLE deficits/detail   LLE Deficits / Details: 3-/5 quads with AAROM at knee -10- 80  Cervical / Trunk Assessment: Normal  Communication   Communication: No difficulties  Cognition Arousal/Alertness: Awake/alert Behavior During Therapy: WFL for tasks assessed/performed;Anxious Overall Cognitive Status: Within Functional Limits for tasks assessed                      General Comments      Exercises Total Joint Exercises Ankle Circles/Pumps: AROM;Both;15 reps;Supine Quad Sets: AROM;Both;10 reps;Supine Heel Slides: AAROM;10 reps;Supine;Left Straight Leg Raises: AAROM;AROM;Left;15 reps;Supine      Assessment/Plan    PT Assessment Patient needs continued PT services  PT Diagnosis Difficulty walking   PT Problem List Decreased strength;Decreased range of motion;Decreased activity tolerance;Decreased mobility;Decreased knowledge of use of DME;Pain  PT Treatment Interventions DME instruction;Gait training;Stair training;Functional mobility training;Therapeutic activities;Therapeutic exercise;Patient/family education   PT Goals (Current goals can be  found in the Care Plan section) Acute Rehab PT Goals Patient Stated Goal: Resume previous lifestyle with decreased pain PT Goal Formulation: With patient Time For Goal Achievement: 07/21/14 Potential to Achieve Goals: Good    Frequency 7X/week   Barriers to discharge        Co-evaluation               End of Session Equipment Utilized During Treatment: Gait belt Activity Tolerance: Patient tolerated treatment well Patient left: in chair;with call  bell/phone within reach Nurse Communication: Mobility status         Time: 0815-0900 PT Time Calculation (min) (ACUTE ONLY): 45 min   Charges:   PT Evaluation $Initial PT Evaluation Tier I: 1 Procedure PT Treatments $Gait Training: 8-22 mins $Therapeutic Exercise: 8-22 mins   PT G Codes:        Diamantina Edinger 08/16/14, 9:10 AM

## 2014-07-18 DIAGNOSIS — E669 Obesity, unspecified: Secondary | ICD-10-CM | POA: Diagnosis present

## 2014-07-18 LAB — CBC
HCT: 39.1 % (ref 39.0–52.0)
Hemoglobin: 13 g/dL (ref 13.0–17.0)
MCH: 31.6 pg (ref 26.0–34.0)
MCHC: 33.2 g/dL (ref 30.0–36.0)
MCV: 94.9 fL (ref 78.0–100.0)
Platelets: 126 10*3/uL — ABNORMAL LOW (ref 150–400)
RBC: 4.12 MIL/uL — AB (ref 4.22–5.81)
RDW: 13.5 % (ref 11.5–15.5)
WBC: 6 10*3/uL (ref 4.0–10.5)

## 2014-07-18 LAB — BASIC METABOLIC PANEL
Anion gap: 8 (ref 5–15)
BUN: 15 mg/dL (ref 6–20)
CALCIUM: 8.8 mg/dL — AB (ref 8.9–10.3)
CHLORIDE: 107 mmol/L (ref 101–111)
CO2: 25 mmol/L (ref 22–32)
CREATININE: 0.96 mg/dL (ref 0.61–1.24)
GFR calc Af Amer: >60 mL/min (ref >60)
GFR calc non Af Amer: >60 mL/min (ref >60)
Glucose, Bld: 126 mg/dL — ABNORMAL HIGH (ref 65–99)
Potassium: 4 mmol/L (ref 3.5–5.1)
Sodium: 140 mmol/L (ref 135–145)

## 2014-07-18 MED ORDER — FERROUS SULFATE 325 (65 FE) MG PO TABS: 325.0000 mg | ORAL_TABLET | Freq: Three times a day (TID) | ORAL | Status: DC

## 2014-07-18 MED ORDER — POLYETHYLENE GLYCOL 3350 17 G PO PACK: 17.0000 g | PACK | Freq: Two times a day (BID) | ORAL | Status: DC

## 2014-07-18 MED ORDER — ACETAMINOPHEN 500 MG PO TABS
500.0000 mg | ORAL_TABLET | Freq: Four times a day (QID) | ORAL | Status: DC | PRN
  Administered 2014-07-18: 1000 mg via ORAL
  Filled 2014-07-18: qty 2

## 2014-07-18 MED ORDER — ASPIRIN 325 MG PO TBEC: 325.0000 mg | DELAYED_RELEASE_TABLET | Freq: Two times a day (BID) | ORAL | Status: AC

## 2014-07-18 MED ORDER — DOCUSATE SODIUM 100 MG PO CAPS: 100.0000 mg | ORAL_CAPSULE | Freq: Two times a day (BID) | ORAL | Status: DC

## 2014-07-18 MED ORDER — ALPRAZOLAM 0.5 MG PO TABS: 0.5000 mg | ORAL_TABLET | Freq: Four times a day (QID) | ORAL | Status: DC | PRN

## 2014-07-18 MED ORDER — HYDROCODONE-ACETAMINOPHEN 7.5-325 MG PO TABS: 1.0000 | ORAL_TABLET | ORAL | Status: DC | PRN

## 2014-07-18 NOTE — Discharge Instructions (Signed)

## 2014-07-18 NOTE — Plan of Care (Signed)
Problem: Consults Goal: Diagnosis- Total Joint Replacement Outcome: Completed/Met Date Met:  07/18/14 Primary Total Knee LEFT  Problem: Phase III Progression Outcomes Goal: Anticoagulant follow-up in place Outcome: Not Applicable Date Met:  70/92/95 ASA for VTE, no f/u needed.

## 2014-07-18 NOTE — Discharge Summary (Signed)
Physician Discharge Summary  Patient ID: Benjamin [name] MRN: [id number] DOB/AGE: 09/20/1940 74 y.o.  Admit date: 07/16/2014 Discharge date:  07/18/2014  Procedures:  Procedure(s) (LRB): LEFT TOTAL KNEE ARTHROPLASTY (Left)  Attending Physician:  Dr. Paralee Cancel   Admission Diagnoses:   Left knee primary OA / pain  Discharge Diagnoses:  Principal Problem:   S/P left TKA Active Problems:   S/P knee replacement   Obese  Past Medical History  Diagnosis Date  . Hyperlipidemia   . BPH (benign prostatic hyperplasia)     with elevated PSA, sees Dr. Diona Fanti, benign  . Anxiety   . Insomnia   . GERD (gastroesophageal reflux disease)   . Elevated PSA   . Diabetes mellitus     borderline   . Arthritis     HPI:    Benjamin [name], 74 y.o. male, has a history of pain and functional disability in the left knee due to arthritis and has failed non-surgical conservative treatments for greater than 12 weeks to includeNSAID's and/or analgesics, corticosteriod injections, viscosupplementation injections and activity modification. Onset of symptoms was gradual, starting >10 years ago with gradually worsening course since that time. The patient noted prior procedures on the knee to include arthroscopy on the left knee(s). Patient currently rates pain in the left knee(s) at 7 out of 10 with activity. Patient has worsening of pain with activity and weight bearing, pain that interferes with activities of daily living, pain with passive range of motion, crepitus and joint swelling. Patient has evidence of periarticular osteophytes and joint space narrowing by imaging studies. There is no active infection. Risks, benefits and expectations were discussed with the patient. Risks including but not limited to the risk of anesthesia, blood clots, nerve damage, blood vessel damage, failure of the prosthesis, infection and up to and including death. Patient understand the risks, benefits and expectations and  wishes to proceed with surgery.   PCP: Laurey Morale, MD   Discharged Condition: good  Hospital Course:  Patient underwent the above stated procedure on 07/16/2014. Patient tolerated the procedure well and brought to the recovery room in good condition and subsequently to the floor.  POD #1 BP: 130/66 ; Pulse: 51 ; Temp: 98.4 F (36.9 C) ; Resp: 14 Patient reports pain as mild. Not much pain last night. Doing well. Dorsiflexion/plantar flexion intact, incision: dressing C/D/I, no cellulitis present and compartment soft.   LABS  Basename    HGB  13.0  HCT  37.4   POD #2  BP: 173/82 ; Pulse: 63 ; Temp: 98.1 F (36.7 C) ; Resp: 18 Patient reports pain as mild, pain controlled. No events throughout the night. Ready to be discharged to SNF. Dorsiflexion/plantar flexion intact, incision: dressing C/D/I, no cellulitis present and compartment soft.   LABS  Basename    HGB  13.0  HCT  39.1    Discharge Exam: General appearance: alert, cooperative and no distress Extremities: Homans sign is negative, no sign of DVT, no edema, redness or tenderness in the calves or thighs and no ulcers, gangrene or trophic changes  Disposition:   Skilled nursing facility with follow up in 2 weeks   Follow-up Information    Follow up with Mauri Pole, MD. Schedule an appointment as soon as possible for a visit in 2 weeks.   Specialty:  Orthopedic Surgery   Contact information:   [address] [place] [address] [phone]       Discharge Instructions    Call MD /  Call 911    Complete by:  As directed   If you experience chest pain or shortness of breath, CALL 911 and be transported to the hospital emergency room.  If you develope a fever above 101 F, pus (white drainage) or increased drainage or redness at the wound, or calf pain, call your surgeon's office.     Change dressing    Complete by:  As directed   Maintain surgical dressing until follow up in the clinic. If  the edges start to pull up, may reinforce with tape. If the dressing is no longer working, may remove and cover with gauze and tape, but must keep the area dry and clean.  Call with any questions or concerns.     Constipation Prevention    Complete by:  As directed   Drink plenty of fluids.  Prune juice may be helpful.  You may use a stool softener, such as Colace (over the counter) 100 mg twice a day.  Use MiraLax (over the counter) for constipation as needed.     Diet - low sodium heart healthy    Complete by:  As directed      Discharge instructions    Complete by:  As directed   Maintain surgical dressing until follow up in the clinic. If the edges start to pull up, may reinforce with tape. If the dressing is no longer working, may remove and cover with gauze and tape, but must keep the area dry and clean.  Follow up in 2 weeks at [facility]. Call with any questions or concerns.     Increase activity slowly as tolerated    Complete by:  As directed      TED hose    Complete by:  As directed   Use stockings (TED hose) for 2 weeks on both leg(s).  You may remove them at night for sleeping.     Weight bearing as tolerated    Complete by:  As directed   Laterality:  left  Extremity:  Lower             Medication List    STOP taking these medications        acetaminophen 500 MG tablet  Commonly known as:  TYLENOL     naproxen 500 MG tablet  Commonly known as:  NAPROSYN      TAKE these medications        ALPRAZolam 0.5 MG tablet  Commonly known as:  XANAX  Take 1 tablet (0.5 mg total) by mouth every 6 (six) hours as needed. for anxiety     aspirin 325 MG EC tablet  Take 1 tablet (325 mg total) by mouth 2 (two) times daily.     docusate sodium 100 MG capsule  Commonly known as:  COLACE  Take 1 capsule (100 mg total) by mouth 2 (two) times daily.     ferrous sulfate 325 (65 FE) MG tablet  Take 1 tablet (325 mg total) by mouth 3 (three) times daily after  meals.     HYDROcodone-acetaminophen 7.5-325 MG per tablet  Commonly known as:  NORCO  Take 1-2 tablets by mouth every 4 (four) hours as needed for moderate pain.     omeprazole 40 MG capsule  Commonly known as:  PRILOSEC  Take 1 capsule (40 mg total) by mouth 2 (two) times daily.     polyethylene glycol packet  Commonly known as:  MIRALAX / GLYCOLAX  Take 17 g by mouth 2 (  two) times daily.         Signed: [place]. Daiden Coltrane   PA-C  07/18/2014, 8:43 AM

## 2014-07-18 NOTE — Progress Notes (Signed)
     Subjective: 2 Days Post-Op Procedure(s) (LRB): LEFT TOTAL KNEE ARTHROPLASTY (Left)   Patient reports pain as mild, pain controlled. No events throughout the night. Ready to be discharged to SNF.  Objective:   VITALS:   Filed Vitals:   07/18/14 0601  BP: 173/82  Pulse: 63  Temp: 98.1 F (36.7 C)  Resp: 18    Dorsiflexion/Plantar flexion intact Incision: dressing C/D/I No cellulitis present Compartment soft  LABS  Recent Labs  07/17/14 0430 07/18/14 0440  HGB 13.0 13.0  HCT 37.4* 39.1  WBC 7.7 6.0  PLT 131* 126*     Recent Labs  07/17/14 0430 07/18/14 0440  NA 138 140  K 4.4 4.0  BUN 15 15  CREATININE 0.84 0.96  GLUCOSE 136* 126*     Assessment/Plan: 2 Days Post-Op Procedure(s) (LRB): LEFT TOTAL KNEE ARTHROPLASTY (Left) Up with therapy Discharge to SNF  Follow up in 2 weeks at [facility]. Follow up with OLIN,Taurus Willis D in 2 weeks.  Contact information:  [facility] [address] [place] [phone]    Obese (BMI 30-39.9) Estimated body mass index is 32.2 kg/(m^2) as calculated from the following:   Height as of this encounter: 6' (1.829 m).   Weight as of this encounter: 107.729 kg (237 lb 8 oz). Patient also counseled that weight may inhibit the healing process Patient counseled that losing weight will help with future health issues         [place] Pugh. Aunika Kirsten   PAC  07/18/2014, 8:29 AM

## 2014-07-18 NOTE — Clinical Social Work Placement (Signed)
   CLINICAL SOCIAL WORK PLACEMENT  NOTE  Date:  07/18/2014  Patient Details  Name: Benjamin [name] MRN: [id number] Date of Birth: Sep 29, 1940  Clinical Social Work is seeking post-discharge placement for this patient at the [place] level of care (*CSW will initial, date and re-position this form in  chart as items are completed):  No   Patient/family provided with [place] Work Department's list of facilities offering this level of care within the geographic area requested by the patient (or if unable, by the patient's family).  Yes   Patient/family informed of their freedom to choose among providers that offer the needed level of care, that participate in Medicare, Medicaid or managed care program needed by the patient, have an available bed and are willing to accept the patient.  No   Patient/family informed of [place]'s ownership interest in [facility] and [facility], as well as of the fact that they are under no obligation to receive care at these facilities.  PASRR submitted to EDS on       PASRR number received on       Existing PASRR number confirmed on 07/17/14     FL2 transmitted to all facilities in geographic area requested by pt/family on 07/17/14     FL2 transmitted to all facilities within larger geographic area on       Patient informed that his/her managed care company has contracts with or will negotiate with certain facilities, including the following:        Yes   Patient/family informed of bed offers received.  Patient chooses bed at [facility]     Physician recommends and patient chooses bed at      Patient to be transferred to [facility] on 07/18/14.  Patient to be transferred to facility by [place]     Patient family notified on 07/18/14 of transfer.  Name of family member notified:  Pt contacted his friend directly.     PHYSICIAN       Additional Comment: Pt is in agreement with d/c to [facility]  today. Blue Medicare provided prior authorization. Pt was able to transport by car. NSG reviewed d/c summary, scripts, avs. Scripts included in d/c packet. D/c packet provided to pt prior to d/c.   _______________________________________________ Luretha Rued, LCSW 07/18/2014, 4:06 PM

## 2014-07-18 NOTE — Progress Notes (Signed)
Physical Therapy Treatment Patient Details Name: Benjamin [name] MRN: [id number] DOB: 18-Mar-1940 Today's Date: 07/18/2014    History of Present Illness L TKR    PT Comments    POD # 2 am session.  Pt c/o increased "soreness" today.  Asked, "is that normal?".  Assisted pt OOB to amb a limited distance in hallway.  Assisted to BR to void then back to bed to perform TKR TE's followed by ICE.   Follow Up Recommendations  SNF     Equipment Recommendations  None recommended by PT    Recommendations for Other Services       Precautions / Restrictions Precautions Precautions: Fall;Knee Restrictions Weight Bearing Restrictions: No Other Position/Activity Restrictions: WBAT    Mobility  Bed Mobility Overal bed mobility: Needs Assistance Bed Mobility: Supine to Sit;Sit to Supine     Supine to sit: Min assist Sit to supine: Min assist   General bed mobility comments: support/guide L LE plus increased time  Transfers Overall transfer level: Needs assistance Equipment used: Rolling walker (2 wheeled) Transfers: Sit to/from Stand Sit to Stand: Min guard;Min assist         General transfer comment: 25% Vc's on proper tech and hand placement.  50% VC's to advance L LE prior to sit.  Plus increased time.  More sore today.   Ambulation/Gait Ambulation/Gait assistance: Min guard;Min assist Ambulation Distance (Feet): 31 Feet Assistive device: Rolling walker (2 wheeled) Gait Pattern/deviations: Step-to pattern;Decreased stance time - left Gait velocity: decreased   General Gait Details: 25% VC's on proper sequencing, proper upright posture and safety with turn. Pt also demonstrates increased anxiety/fear of increasing pain.  Requires increased time and encouragement.    Stairs            Wheelchair Mobility    Modified Rankin (Stroke Patients Only)       Balance                                    Cognition Arousal/Alertness: Awake/alert Behavior  During Therapy: Anxious Overall Cognitive Status: Within Functional Limits for tasks assessed                      Exercises      General Comments        Pertinent Vitals/Pain Pain Assessment: 0-10 Pain Score: 5  Pain Location: L knee Pain Descriptors / Indicators: Sore;Burning Pain Intervention(s): Monitored during session;Premedicated before session;Repositioned;Ice applied    Home Living                      Prior Function            PT Goals (current goals can now be found in the care plan section) Progress towards PT goals: Progressing toward goals    Frequency  7X/week    PT Plan      Co-evaluation             End of Session Equipment Utilized During Treatment: Gait belt Activity Tolerance: Patient tolerated treatment well Patient left: in bed;with call bell/phone within reach     Time: 1323- 12:10    Charges:  $Gait Training: 8-22 mins $Therapeutic Exercise: 8-22 mins $Therapeutic Activity: 8-22 mins                    G Codes:      Ryder Chesmore  PTA [employer]  Acute  Rehab Pager      [phone]

## 2014-07-19 ENCOUNTER — Non-Acute Institutional Stay (SKILLED_NURSING_FACILITY): Payer: Medicare Other | Admitting: Adult Health

## 2014-07-19 DIAGNOSIS — K5900 Constipation, unspecified: Secondary | ICD-10-CM | POA: Diagnosis not present

## 2014-07-19 DIAGNOSIS — F411 Generalized anxiety disorder: Secondary | ICD-10-CM | POA: Diagnosis not present

## 2014-07-19 DIAGNOSIS — D62 Acute posthemorrhagic anemia: Secondary | ICD-10-CM | POA: Diagnosis not present

## 2014-07-19 DIAGNOSIS — K219 Gastro-esophageal reflux disease without esophagitis: Secondary | ICD-10-CM

## 2014-07-19 DIAGNOSIS — M1712 Unilateral primary osteoarthritis, left knee: Secondary | ICD-10-CM

## 2014-07-19 DIAGNOSIS — E119 Type 2 diabetes mellitus without complications: Secondary | ICD-10-CM | POA: Diagnosis not present

## 2014-07-19 DIAGNOSIS — Z96652 Presence of left artificial knee joint: Secondary | ICD-10-CM | POA: Diagnosis not present

## 2014-07-20 ENCOUNTER — Non-Acute Institutional Stay (SKILLED_NURSING_FACILITY): Payer: Medicare Other | Admitting: Internal Medicine

## 2014-07-20 ENCOUNTER — Encounter: Payer: Self-pay | Admitting: Internal Medicine

## 2014-07-20 ENCOUNTER — Encounter: Payer: Self-pay | Admitting: Adult Health

## 2014-07-20 DIAGNOSIS — M1712 Unilateral primary osteoarthritis, left knee: Secondary | ICD-10-CM

## 2014-07-20 DIAGNOSIS — I10 Essential (primary) hypertension: Secondary | ICD-10-CM

## 2014-07-20 DIAGNOSIS — F411 Generalized anxiety disorder: Secondary | ICD-10-CM

## 2014-07-20 DIAGNOSIS — D509 Iron deficiency anemia, unspecified: Secondary | ICD-10-CM

## 2014-07-20 DIAGNOSIS — K5900 Constipation, unspecified: Secondary | ICD-10-CM

## 2014-07-20 NOTE — Progress Notes (Signed)
Patient ID: Benjamin [name], male   DOB: [birth year], 74 y.o.   MRN: [id number]     [place]  PCP: Laurey Morale, MD  Code Status: Full Code   Allergies  Allergen Reactions  . Norco [Hydrocodone-Acetaminophen]     Chief Complaint  Patient presents with  . New Admit To SNF    New Admission      HPI:  74 year old patient is here for short term rehabilitation post hospital admission from 07/16/14-07/18/14 for left knee OA. He underwent left total knee arthroplasty. He is seen in his room today. He denies any concerns. No new concern from staff. On review of chart, has elevated bp readings.  Review of Systems:  Constitutional: Negative for fever, chills, diaphoresis.  HENT: Negative for headache, congestion, nasal discharge Eyes: Negative for eye pain, blurred vision, double vision and discharge.  Respiratory: Negative for cough, shortness of breath and wheezing.   Cardiovascular: Negative for chest pain, palpitations. Positive for left leg swelling.  Gastrointestinal: Negative for heartburn, nausea, vomiting, abdominal pain Genitourinary: Negative for dysuria  Musculoskeletal: Negative for back pain, falls Neurological: Negative for dizziness, tingling, focal weakness Psychiatric/Behavioral: Negative for depression   Past Medical History  Diagnosis Date  . Hyperlipidemia   . BPH (benign prostatic hyperplasia)     with elevated PSA, sees Dr. Diona Fanti, benign  . Anxiety   . Insomnia   . GERD (gastroesophageal reflux disease)   . Elevated PSA   . Diabetes mellitus     borderline   . Arthritis    Past Surgical History  Procedure Laterality Date  . Prostate biopsies in 2003 and again 05/2008      per Dr. Diona Fanti. benign  . Right knee arthroscopy    . Reapir of large umblicial hernia      per Dr. Kaylyn Lim on 01/10/10  . Colonoscopy      02/28/10 per Dr. Carlean Purl, single adenomatous polyp, repeat in 5 yrs.   . Liposuction  1987  . Refractive surgery    .  Cholecystectomy N/A 01/10/2014    Procedure: LAPAROSCOPIC CHOLECYSTECTOMY WITH INTRAOPERATIVE CHOLANGIOGRAM;  Surgeon: Pedro Earls, MD;  Location: [facility];  Service: General;  Laterality: N/A;  . Tonsillectomy    . Left knee arthroscopy     . Total knee arthroplasty Left 07/16/2014    Procedure: LEFT TOTAL KNEE ARTHROPLASTY;  Surgeon: Paralee Cancel, MD;  Location: [facility];  Service: Orthopedics;  Laterality: Left;   Social History:   reports that he quit smoking about 40 years ago. His smoking use included Cigarettes. He smoked 1.50 packs per day. He has never used smokeless tobacco. He reports that he drinks about 3.5 oz of alcohol per week. He reports that he uses illicit drugs (Marijuana).  Family History  Problem Relation Age of Onset  . Alzheimer's disease Other     Medications: Patient's Medications  New Prescriptions   No medications on file  Previous Medications   ACETAMINOPHEN (TYLENOL) 500 MG TABLET    Take 1,000 mg by mouth every 6 (six) hours as needed for moderate pain.   ALPRAZOLAM (XANAX) 0.5 MG TABLET    Take 1 tablet (0.5 mg total) by mouth every 6 (six) hours as needed. for anxiety   ASPIRIN EC 325 MG EC TABLET    Take 1 tablet (325 mg total) by mouth 2 (two) times daily.   DOCUSATE SODIUM (COLACE) 100 MG CAPSULE    Take 1 capsule (100 mg total)  by mouth 2 (two) times daily.   FERROUS SULFATE 325 (65 FE) MG TABLET    Take 1 tablet (325 mg total) by mouth 3 (three) times daily after meals.   METHOCARBAMOL (ROBAXIN) 500 MG TABLET    Take 500 mg by mouth every 6 (six) hours as needed for muscle spasms (Muscle Spasms).   POLYETHYLENE GLYCOL (MIRALAX / GLYCOLAX) PACKET    Take 17 g by mouth 2 (two) times daily.   TRAMADOL (ULTRAM) 50 MG TABLET    Take 1-2 by mouth every 6 hours as needed for pain  Modified Medications   Modified Medication Previous Medication   OMEPRAZOLE (PRILOSEC) 40 MG CAPSULE omeprazole (PRILOSEC) 40 MG capsule      Take 1 capsule (40 mg total) by mouth  2 (two) times daily.    Take 1 capsule (40 mg total) by mouth 2 (two) times daily.  Discontinued Medications   HYDROCODONE-ACETAMINOPHEN (NORCO) 7.5-325 MG PER TABLET    Take 1-2 tablets by mouth every 4 (four) hours as needed for moderate pain.     Physical Exam: Filed Vitals:   07/20/14 1109  BP: 170/97  Pulse: 79  Temp: 96.6 F (35.9 C)  TempSrc: Oral  Resp: 20  Height: 6' (1.829 m)  Weight: 236 lb (107.049 kg)  SpO2: 97%    General- elderly male, obese, in no acute distress Head- normocephalic, atraumatic Throat- moist mucus membrane Eyes- PERRLA, EOMI, no pallor, no icterus, no discharge, normal conjunctiva, normal sclera Neck- no cervical lymphadenopathy Cardiovascular- normal s1,s2, no murmurs, palpable dorsalis pedis, left leg 1+  edema Respiratory- bilateral clear to auscultation, no wheeze, no rhonchi, no crackles, no use of accessory muscles Abdomen- bowel sounds present, soft, non tender Musculoskeletal- able to move all 4 extremities, left leg limited range of motion  Neurological- no focal deficit Skin- warm and dry, right knee has aquacel dressing on surgical incision Psychiatry- alert and oriented to person, place and time, normal mood and affect    Labs reviewed: Basic Metabolic Panel:  Recent Labs  07/05/14 1340 07/17/14 0430 07/18/14 0440  NA 140 138 140  K 4.0 4.4 4.0  CL 104 105 107  CO2 26 26 25   GLUCOSE 110* 136* 126*  BUN 16 15 15   CREATININE 0.82 0.84 0.96  CALCIUM 9.3 8.5* 8.8*   Liver Function Tests:  Recent Labs  01/11/14 0350  AST 37  ALT 54*  ALKPHOS 63  BILITOT 0.5  PROT 6.5  ALBUMIN 3.7   No results for input(s): LIPASE, AMYLASE in the last 8760 hours. No results for input(s): AMMONIA in the last 8760 hours. CBC:  Recent Labs  07/05/14 1340 07/17/14 0430 07/18/14 0440  WBC 3.5* 7.7 6.0  HGB 15.4 13.0 13.0  HCT 45.4 37.4* 39.1  MCV 95.0 94.0 94.9  PLT 106* 131* 126*   Cardiac Enzymes: No results for  input(s): CKTOTAL, CKMB, CKMBINDEX, TROPONINI in the last 8760 hours. BNP: Invalid input(s): POCBNP CBG:  Recent Labs  01/10/14 1117 07/16/14 1136  GLUCAP 131* 138*    Assessment/Plan  Left knee Osteoarthritis  S/P left total knee arthroplasty. Will have him work with physical therapy and occupational therapy team to help with gait training and muscle strengthening exercises.fall precautions. Skin care. Encourage to be out of bed.  Has follow up with orthopedics. Continue tramadol 50 mg 1-2 tabs q6h prn pain with tylenol for breakthrough pain. Continue robaxin 500 mg q6h prn muscle spasm. Continue aspirin 325 mg bid for dvt prophylaxis  HTN Start amlodipine 5 mg daily, check bp q shift and adjust medication if needed  Iron deficiency anemia Continue ferrous sulfate 325 mg tid, monitor h&h  Constipation Continue Colace 100 mg bid and miralax bid for now, hydration encouraged  GERD Continue omeprazole 40 mg bid and monitor  GAD Stable, continue xanax 0.5 mg q6h prn and monitor   Goals of care: short term rehabilitation   Labs/tests ordered: cbc, bmp  Family/ staff Communication: reviewed care plan with patient and nursing supervisor    Blanchie Serve, MD  [facility] [phone] (Monday-Friday 8 am - 5 pm) [phone] (afterhours)

## 2014-07-20 NOTE — Progress Notes (Signed)
Patient ID: Benjamin [name], male   DOB: October 17, 1940, 74 y.o.   MRN: [id number]   07/19/14  Facility:  [facility]ursing Home Location:  [place] Room Number: 703-P LEVEL OF CARE:  SNF (31)    Chief Complaint  Patient presents with  . Hospitalization Follow-up     osteoarthritis S/P left total knee arthroplasty, anemia, constipation , GERD and anxiety    HISTORY OF PRESENT ILLNESS:   This is a 74 year old male who was been admitted to [facility] on 07/18/14 from [facility] with osteoarthritis S/P left total knee arthroplasty.  He has PMH of hyperlipidemia, BPH, anxiety, insomnia,  Diabetes mellitus borderline and arthritis.   He has been admitted for a short-term rehabilitation.  PAST MEDICAL HISTORY:  Past Medical History  Diagnosis Date  . Hyperlipidemia   . BPH (benign prostatic hyperplasia)     with elevated PSA, sees Dr. Diona Fanti, benign  . Anxiety   . Insomnia   . GERD (gastroesophageal reflux disease)   . Elevated PSA   . Diabetes mellitus     borderline   . Arthritis     CURRENT MEDICATIONS: Reviewed per MAR/see medication list  Allergies  Allergen Reactions  . Norco [Hydrocodone-Acetaminophen]     REVIEW OF SYSTEMS:  GENERAL: no change in appetite, no fatigue, no weight changes, no fever, chills or weakness RESPIRATORY: no cough, SOB, DOE, wheezing, hemoptysis CARDIAC: no chest pain, edema or palpitations GI: no abdominal pain, diarrhea, constipation, heart burn, nausea or vomiting  PHYSICAL EXAMINATION  GENERAL: no acute distress, obese SKIN:   Left knee surgical incision is covered with Aquacel dressing , no erythema, dry EYES: conjunctivae normal, sclerae normal, normal eye lids NECK: supple, trachea midline, no neck masses, no thyroid tenderness, no thyromegaly LYMPHATICS: no LAN in the neck, no supraclavicular LAN RESPIRATORY: breathing is even & unlabored, BS CTAB CARDIAC: RRR, no murmur,no extra heart sounds, no  edema GI: abdomen soft, normal BS, no masses, no tenderness, no hepatomegaly, no splenomegaly EXTREMITIES:   Able to move 4 extremities PSYCHIATRIC: the patient is alert & oriented to person, affect & behavior appropriate  LABS/RADIOLOGY: Labs reviewed: Basic Metabolic Panel:  Recent Labs  07/05/14 1340 07/17/14 0430 07/18/14 0440  NA 140 138 140  K 4.0 4.4 4.0  CL 104 105 107  CO2 26 26 25   GLUCOSE 110* 136* 126*  BUN 16 15 15   CREATININE 0.82 0.84 0.96  CALCIUM 9.3 8.5* 8.8*   Liver Function Tests:  Recent Labs  01/11/14 0350  AST 37  ALT 54*  ALKPHOS 63  BILITOT 0.5  PROT 6.5  ALBUMIN 3.7    Recent Labs  07/05/14 1340 07/17/14 0430 07/18/14 0440  WBC 3.5* 7.7 6.0  HGB 15.4 13.0 13.0  HCT 45.4 37.4* 39.1  MCV 95.0 94.0 94.9  PLT 106* 131* 126*   CBG:  Recent Labs  01/10/14 1117 07/16/14 1136  GLUCAP 131* 138*     ASSESSMENT/PLAN:  Osteoarthritis S/P left total knee arthroplasty -  for rehabilitation; discontinue Norco and start tramadol 50 mg 1-2 tabs by mouth every 6 hours when necessary for pain and Tylenol 500 mg 2 tabs =  1000 mg by mouth every 6 hours when necessary for pain; Robaxin 500 mg 1 tab by mouth every 6 hours when necessary for muscle spasm; and aspirin 325 mg 1 tab by mouth twice a day Anemia, acute blood loss -  Continue ferrous sulfate 325 mg 1 tab by mouth  3 times a day Constipation -  Continue Colace 100 mg by mouth twice a day and MiraLAX 17 g by mouth twice a day GERD -  Continue omeprazole 40 mg 1 capsule by mouth twice a day Anxiety -  Mood is stable; continue Xanax 0.5 mg 1 tab by mouth every 6 hours when necessary Diabetes Mellitus, type 2 - diet-controlled   Goals of care:  Short-term rehabilitation   Labs/test ordered:  CBC and BMP    [place], NP [employer] [phone]

## 2014-07-21 ENCOUNTER — Non-Acute Institutional Stay (SKILLED_NURSING_FACILITY): Payer: Medicare Other | Admitting: Adult Health

## 2014-07-21 DIAGNOSIS — K219 Gastro-esophageal reflux disease without esophagitis: Secondary | ICD-10-CM | POA: Diagnosis not present

## 2014-07-21 DIAGNOSIS — Z96652 Presence of left artificial knee joint: Secondary | ICD-10-CM | POA: Diagnosis not present

## 2014-07-21 DIAGNOSIS — M1712 Unilateral primary osteoarthritis, left knee: Secondary | ICD-10-CM

## 2014-07-21 DIAGNOSIS — E119 Type 2 diabetes mellitus without complications: Secondary | ICD-10-CM | POA: Diagnosis not present

## 2014-07-21 DIAGNOSIS — F411 Generalized anxiety disorder: Secondary | ICD-10-CM | POA: Diagnosis not present

## 2014-07-21 DIAGNOSIS — D62 Acute posthemorrhagic anemia: Secondary | ICD-10-CM | POA: Diagnosis not present

## 2014-07-21 DIAGNOSIS — K5900 Constipation, unspecified: Secondary | ICD-10-CM

## 2014-07-21 DIAGNOSIS — I10 Essential (primary) hypertension: Secondary | ICD-10-CM | POA: Diagnosis not present

## 2014-07-25 ENCOUNTER — Encounter: Payer: Self-pay | Admitting: Adult Health

## 2014-07-25 NOTE — Progress Notes (Addendum)
Patient ID: Benjamin [name], male   DOB: [birth year], 74 y.o.   MRN: [id number]   07/21/14  Facility:  Nursing Home Location:  [place] Room Number: 703-P LEVEL OF CARE:  SNF (31)    Chief Complaint  Patient presents with  . Discharge Note    Osteoarthritis S/P left total knee arthroplasty, anemia, constipation , GERD and anxiety    HISTORY OF PRESENT ILLNESS:   This is a 74 year old male who is for discharge home with Home health PT for endurance and OT for ADLs.  He has been admitted to [facility] on 07/18/14 from [facility] with osteoarthritis S/P left total knee arthroplasty.  He has PMH of hyperlipidemia, BPH, anxiety, insomnia,  Diabetes mellitus borderline and arthritis.  Noted BPs to be elevated; 164/84, 177/88 and 170/97. No complaints of headache nor dizziness.  Patient was admitted to this facility for short-term rehabilitation after the patient's recent hospitalization.  Patient has completed SNF rehabilitation and therapy has cleared the patient for discharge.    PAST MEDICAL HISTORY:  Past Medical History  Diagnosis Date  . Hyperlipidemia   . BPH (benign prostatic hyperplasia)     with elevated PSA, sees Dr. Diona Fanti, benign  . Anxiety   . Insomnia   . GERD (gastroesophageal reflux disease)   . Elevated PSA   . Diabetes mellitus     borderline   . Arthritis     CURRENT MEDICATIONS: Reviewed per MAR/see medication list  Allergies  Allergen Reactions  . Norco [Hydrocodone-Acetaminophen]     REVIEW OF SYSTEMS:  GENERAL: no change in appetite, no fatigue, no weight changes, no fever, chills or weakness RESPIRATORY: no cough, SOB, DOE, wheezing, hemoptysis CARDIAC: no chest pain, edema or palpitations GI: no abdominal pain, diarrhea, constipation, heart burn, nausea or vomiting  PHYSICAL EXAMINATION  GENERAL: no acute distress, obese SKIN:   Left knee surgical incision is covered with Aquacel dressing , no  erythema, dry NECK: supple, trachea midline, no neck masses, no thyroid tenderness, no thyromegaly LYMPHATICS: no LAN in the neck, no supraclavicular LAN RESPIRATORY: breathing is even & unlabored, BS CTAB CARDIAC: RRR, no murmur,no extra heart sounds, no edema GI: abdomen soft, normal BS, no masses, no tenderness, no hepatomegaly, no splenomegaly EXTREMITIES:   Able to move 4 extremities PSYCHIATRIC: the patient is alert & oriented to person, affect & behavior appropriate  LABS/RADIOLOGY: Labs reviewed: 07/20/14  WBC 5.1 hemoglobin 12.4 hematocrit 35.1 MCV 89.1 sodium 139 potassium 4.1 glucose 120 BUN 14 creatinine 0.89 calcium 8.3 Basic Metabolic Panel:  Recent Labs  07/05/14 1340 07/17/14 0430 07/18/14 0440  NA 140 138 140  K 4.0 4.4 4.0  CL 104 105 107  CO2 26 26 25   GLUCOSE 110* 136* 126*  BUN 16 15 15   CREATININE 0.82 0.84 0.96  CALCIUM 9.3 8.5* 8.8*   Liver Function Tests:  Recent Labs  01/11/14 0350  AST 37  ALT 54*  ALKPHOS 63  BILITOT 0.5  PROT 6.5  ALBUMIN 3.7    Recent Labs  07/05/14 1340 07/17/14 0430 07/18/14 0440  WBC 3.5* 7.7 6.0  HGB 15.4 13.0 13.0  HCT 45.4 37.4* 39.1  MCV 95.0 94.0 94.9  PLT 106* 131* 126*   CBG:  Recent Labs  01/10/14 1117 07/16/14 1136  GLUCAP 131* 138*     ASSESSMENT/PLAN:  Osteoarthritis S/P left total knee arthroplasty -  for Home health PT and OT; continue tramadol 50 mg 1-2 tabs by mouth  every 6 hours when necessary for pain and Tylenol 500 mg 2 tabs =  1000 mg by mouth every 6 hours when necessary for pain; Robaxin 500 mg 1 tab by mouth every 6 hours when necessary for muscle spasm; and aspirin 325 mg 1 tab by mouth twice a day Anemia, acute blood loss -  hgb 12.4; discontinue FeSO4 Constipation -  Continue Colace 100 mg by mouth twice a day and MiraLAX 17 g by mouth twice a day GERD -  Continue omeprazole 40 mg 1 capsule by mouth twice a day Anxiety -  Mood is stable; continue Xanax 0.5 mg 1 tab by mouth  every 6 hours when necessary Diabetes Mellitus, type 2 - diet-controlled Essential hypertension - start Norvasc 5 mg 1 PO Q D   I have filled out patient's discharge paperwork and written prescriptions.  Patient will receive home health PT and OT.  Total discharge time: Less than 30 minutes  Discharge time involved coordination of the discharge process with Education officer, museum, nursing staff and therapy department. Medical justification for home health services verified.     [facility], NP [employer] [phone]

## 2014-07-25 NOTE — Addendum Note (Signed)
Addended by: Durenda Age C on: 07/25/2014 08:54 PM   Modules accepted: Medications

## 2014-07-26 ENCOUNTER — Telehealth: Payer: Self-pay | Admitting: Family Medicine

## 2014-07-26 NOTE — Telephone Encounter (Signed)
Mallory a OT with [place] call to say that pt BP was elevated 150/70

## 2014-07-27 NOTE — Telephone Encounter (Signed)
noted 

## 2014-08-02 ENCOUNTER — Ambulatory Visit (INDEPENDENT_AMBULATORY_CARE_PROVIDER_SITE_OTHER): Payer: Medicare Other | Admitting: Family Medicine

## 2014-08-02 ENCOUNTER — Encounter: Payer: Self-pay | Admitting: Internal Medicine

## 2014-08-02 ENCOUNTER — Encounter: Payer: Self-pay | Admitting: Family Medicine

## 2014-08-02 VITALS — BP 150/77 | HR 79 | Ht 72.0 in | Wt 224.0 lb

## 2014-08-02 DIAGNOSIS — I10 Essential (primary) hypertension: Secondary | ICD-10-CM | POA: Diagnosis not present

## 2014-08-02 MED ORDER — AMLODIPINE BESYLATE 10 MG PO TABS: 10.0000 mg | ORAL_TABLET | Freq: Every day | ORAL | Status: DC

## 2014-08-02 MED ORDER — LISINOPRIL 10 MG PO TABS: 10.0000 mg | ORAL_TABLET | Freq: Every day | ORAL | Status: DC

## 2014-08-02 NOTE — Progress Notes (Signed)
Pre visit review using our clinic review tool, if applicable. No additional management support is needed unless otherwise documented below in the visit note. 

## 2014-08-02 NOTE — Progress Notes (Signed)
   Subjective:    Patient ID: Benjamin [name], male    DOB: [birth year], 74 y.o.   MRN: [id number]  HPI Here to follow up on HTN. During his recent knee replacement surgery his systolic BP was consistently high in the 160s or 170s, while the diastolic was normal. He has felt fine other than surgery related pains. He is getting out patient PT. He is taking Amlodipine 5 mg bid and his systolic pressure has come down to the 140s.    Review of Systems  Constitutional: Negative.   Respiratory: Negative.   Cardiovascular: Negative.   Neurological: Negative.        Objective:   Physical Exam  Constitutional: He is oriented to person, place, and time. He appears well-developed and well-nourished.  Neck: No thyromegaly present.  Cardiovascular: Normal rate, regular rhythm, normal heart sounds and intact distal pulses.   Pulmonary/Chest: Effort normal and breath sounds normal.  Lymphadenopathy:    He has no cervical adenopathy.  Neurological: He is alert and oriented to person, place, and time.          Assessment & Plan:  HTN. We will convert the Amlodipine to 10 mg once a day and add Lisinopril 10 mg daily. Recheck in 3-4 weeks

## 2014-09-18 ENCOUNTER — Telehealth: Payer: Self-pay | Admitting: Family Medicine

## 2014-09-18 DIAGNOSIS — F32A Depression, unspecified: Secondary | ICD-10-CM

## 2014-09-18 DIAGNOSIS — F329 Major depressive disorder, single episode, unspecified: Secondary | ICD-10-CM

## 2014-09-18 NOTE — Telephone Encounter (Signed)
Patient states that he a referral to [facility] [phone]. Please give patient a call.

## 2014-09-18 NOTE — Telephone Encounter (Signed)
I spoke with pt and he talked with [facility] and they do prefer pt to have a referral. He has appointment on 09/24/14 at 2 pm. I explained to pt that you were out of the office until 09/24/14 and we would do our best to facilitate this order. Please call pt to give a update.

## 2014-09-24 NOTE — Telephone Encounter (Signed)
Referral was done  

## 2014-09-24 NOTE — Telephone Encounter (Signed)
Pt is aware.  

## 2014-12-03 ENCOUNTER — Ambulatory Visit (INDEPENDENT_AMBULATORY_CARE_PROVIDER_SITE_OTHER): Payer: Medicare Other | Admitting: Family Medicine

## 2014-12-03 ENCOUNTER — Encounter: Payer: Self-pay | Admitting: Family Medicine

## 2014-12-03 VITALS — BP 147/81 | HR 63 | Temp 98.3°F | Ht 72.0 in | Wt 221.0 lb

## 2014-12-03 DIAGNOSIS — F418 Other specified anxiety disorders: Secondary | ICD-10-CM

## 2014-12-03 DIAGNOSIS — M25511 Pain in right shoulder: Secondary | ICD-10-CM

## 2014-12-03 MED ORDER — DESVENLAFAXINE SUCCINATE ER 50 MG PO TB24: 50.0000 mg | ORAL_TABLET | Freq: Every day | ORAL | Status: DC

## 2014-12-03 NOTE — Progress Notes (Signed)
Pre visit review using our clinic review tool, if applicable. No additional management support is needed unless otherwise documented below in the visit note. 

## 2014-12-04 DIAGNOSIS — M25511 Pain in right shoulder: Secondary | ICD-10-CM | POA: Insufficient documentation

## 2014-12-04 DIAGNOSIS — F418 Other specified anxiety disorders: Secondary | ICD-10-CM | POA: Insufficient documentation

## 2014-12-04 NOTE — Progress Notes (Signed)
   Subjective:    Patient ID: Benjamin [name], male    DOB: 1940/06/04, 74 y.o.   MRN: [id number]  HPI Here for 2 problems. First he has had pain in the right shoulder for about 2 weeks. No recent trauma, but it bothers him a lot when he drives his car. 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 Ibuprofen helps. Also he asks again about trying something for depression and anxiety. He is using Xanax but this is not enough. He has tried numerous meds in the past including Prozac, Lexapro, Effexor, and Wellbutrin. These either made him gain weight or else they made him more nervous. He continues to struggle with his conflicting emotions concerning his seriously ill wife.    Review of Systems  Constitutional: Negative.   Respiratory: Negative.   Cardiovascular: Negative.   Musculoskeletal: Positive for arthralgias.  Neurological: Negative.   Psychiatric/Behavioral: Positive for sleep disturbance, dysphoric mood and decreased concentration. Negative for suicidal ideas, hallucinations, behavioral problems, confusion, self-injury and agitation. The patient is nervous/anxious.        Objective:   Physical Exam  Constitutional: He appears well-developed and well-nourished.  Cardiovascular: Normal rate, regular rhythm, normal heart sounds and intact distal pulses.   Pulmonary/Chest: Effort normal and breath sounds normal.  Musculoskeletal:  The right shoulder is not tender, but there is a little crepitus present. ROM is intact but full extension is painful.           Assessment & Plan:  The shoulder pain is consistent with rotator cuff tendonitis. He will rest it and use Ibuprofen 800 mg as needed. For the depression and anxiety he will try Pristiq 50 mg daily. Recheck in one month.

## 2014-12-27 ENCOUNTER — Telehealth: Payer: Self-pay | Admitting: Family Medicine

## 2014-12-27 DIAGNOSIS — M25511 Pain in right shoulder: Secondary | ICD-10-CM

## 2014-12-27 NOTE — Telephone Encounter (Signed)
Pt was seen on 10/26 and would like to proceed with getting cortisone injection in his shoulder. ?orth or dr fry for injections

## 2014-12-28 NOTE — Telephone Encounter (Signed)
Referral was done  

## 2014-12-31 NOTE — Telephone Encounter (Signed)
I left a voice message for pt with below information.  

## 2015-01-07 ENCOUNTER — Other Ambulatory Visit: Payer: Self-pay | Admitting: Family Medicine

## 2015-01-08 NOTE — Telephone Encounter (Signed)
Call in #120 with 5 rf 

## 2015-01-14 ENCOUNTER — Other Ambulatory Visit: Payer: Self-pay | Admitting: Family Medicine

## 2015-01-17 ENCOUNTER — Other Ambulatory Visit: Payer: Self-pay | Admitting: Family Medicine

## 2015-02-25 ENCOUNTER — Telehealth: Payer: Self-pay | Admitting: Family Medicine

## 2015-02-25 NOTE — Telephone Encounter (Signed)
Error

## 2015-03-08 ENCOUNTER — Ambulatory Visit (INDEPENDENT_AMBULATORY_CARE_PROVIDER_SITE_OTHER): Payer: Medicare Other | Admitting: Family Medicine

## 2015-03-08 ENCOUNTER — Other Ambulatory Visit: Payer: Self-pay | Admitting: Family Medicine

## 2015-03-08 DIAGNOSIS — J209 Acute bronchitis, unspecified: Secondary | ICD-10-CM

## 2015-03-08 MED ORDER — AZITHROMYCIN 250 MG PO TABS: ORAL_TABLET | ORAL | Status: DC

## 2015-03-08 NOTE — Progress Notes (Signed)
   Subjective:    Patient ID: Benjamin [name], male    DOB: [birth year], 75 y.o.   MRN: [id number]  HPI Here with his wife for chest congestion and a cough.    Review of Systems     Objective:   Physical Exam        Assessment & Plan:

## 2015-03-11 ENCOUNTER — Encounter: Payer: Self-pay | Admitting: Internal Medicine

## 2015-03-25 ENCOUNTER — Other Ambulatory Visit: Payer: Medicare Other

## 2015-04-01 ENCOUNTER — Encounter: Payer: Medicare Other | Admitting: Family Medicine

## 2015-04-15 ENCOUNTER — Other Ambulatory Visit (INDEPENDENT_AMBULATORY_CARE_PROVIDER_SITE_OTHER): Payer: Medicare Other

## 2015-04-15 DIAGNOSIS — Z125 Encounter for screening for malignant neoplasm of prostate: Secondary | ICD-10-CM

## 2015-04-15 DIAGNOSIS — Z0000 Encounter for general adult medical examination without abnormal findings: Secondary | ICD-10-CM | POA: Diagnosis not present

## 2015-04-15 DIAGNOSIS — E785 Hyperlipidemia, unspecified: Secondary | ICD-10-CM

## 2015-04-15 LAB — PSA: PSA: 17.7 ng/mL — ABNORMAL HIGH (ref 0.10–4.00)

## 2015-04-15 LAB — BASIC METABOLIC PANEL
BUN: 12 mg/dL (ref 6–23)
CHLORIDE: 107 meq/L (ref 96–112)
CO2: 23 meq/L (ref 19–32)
CREATININE: 0.87 mg/dL (ref 0.40–1.50)
Calcium: 9.1 mg/dL (ref 8.4–10.5)
GFR: 91.14 mL/min (ref >60.00)
GLUCOSE: 117 mg/dL — AB (ref 70–99)
Potassium: 4.1 mEq/L (ref 3.5–5.1)
Sodium: 139 mEq/L (ref 135–145)

## 2015-04-15 LAB — TSH: TSH: 3.88 u[IU]/mL (ref 0.35–4.50)

## 2015-04-15 LAB — POC URINALSYSI DIPSTICK (AUTOMATED)
Bilirubin, UA: NEGATIVE
Blood, UA: NEGATIVE
Glucose, UA: NEGATIVE
Ketones, UA: NEGATIVE
LEUKOCYTES UA: NEGATIVE
NITRITE UA: NEGATIVE
PH UA: 5.5
PROTEIN UA: NEGATIVE
Spec Grav, UA: 1.02
Urobilinogen, UA: 0.2

## 2015-04-15 LAB — HEPATIC FUNCTION PANEL
ALT: 18 U/L (ref 0–53)
AST: 16 U/L (ref 0–37)
Albumin: 4.4 g/dL (ref 3.5–5.2)
Alkaline Phosphatase: 57 U/L (ref 39–117)
BILIRUBIN DIRECT: 0.1 mg/dL (ref 0.0–0.3)
BILIRUBIN TOTAL: 0.6 mg/dL (ref 0.2–1.2)
TOTAL PROTEIN: 6.3 g/dL (ref 6.0–8.3)

## 2015-04-15 LAB — CBC WITH DIFFERENTIAL/PLATELET
BASOS PCT: 0.6 % (ref 0.0–3.0)
Basophils Absolute: 0 10*3/uL (ref 0.0–0.1)
EOS ABS: 0.1 10*3/uL (ref 0.0–0.7)
Eosinophils Relative: 3 % (ref 0.0–5.0)
HCT: 45 % (ref 39.0–52.0)
Hemoglobin: 15.3 g/dL (ref 13.0–17.0)
Lymphocytes Relative: 40.8 % (ref 12.0–46.0)
Lymphs Abs: 1.5 10*3/uL (ref 0.7–4.0)
MCHC: 34.1 g/dL (ref 30.0–36.0)
MCV: 92 fl (ref 78.0–100.0)
MONO ABS: 0.3 10*3/uL (ref 0.1–1.0)
Monocytes Relative: 8.3 % (ref 3.0–12.0)
NEUTROS ABS: 1.8 10*3/uL (ref 1.4–7.7)
Neutrophils Relative %: 47.3 % (ref 43.0–77.0)
Platelets: 135 10*3/uL — ABNORMAL LOW (ref 150.0–400.0)
RBC: 4.89 Mil/uL (ref 4.22–5.81)
RDW: 13.9 % (ref 11.5–15.5)
WBC: 3.8 10*3/uL — ABNORMAL LOW (ref 4.0–10.5)

## 2015-04-15 LAB — LIPID PANEL
Cholesterol: 244 mg/dL — ABNORMAL HIGH (ref 0–200)
HDL: 50.5 mg/dL (ref >39.00)
LDL Cholesterol: 155 mg/dL — ABNORMAL HIGH (ref 0–99)
NonHDL: 193.18
Total CHOL/HDL Ratio: 5
Triglycerides: 190 mg/dL — ABNORMAL HIGH (ref 0.0–149.0)
VLDL: 38 mg/dL (ref 0.0–40.0)

## 2015-04-22 ENCOUNTER — Ambulatory Visit (INDEPENDENT_AMBULATORY_CARE_PROVIDER_SITE_OTHER): Payer: Medicare Other | Admitting: Family Medicine

## 2015-04-22 ENCOUNTER — Encounter: Payer: Self-pay | Admitting: Family Medicine

## 2015-04-22 VITALS — BP 167/98 | HR 73 | Temp 98.3°F | Ht 72.0 in | Wt 230.0 lb

## 2015-04-22 DIAGNOSIS — Z0000 Encounter for general adult medical examination without abnormal findings: Secondary | ICD-10-CM

## 2015-04-22 NOTE — Progress Notes (Signed)
Pre visit review using our clinic review tool, if applicable. No additional management support is needed unless otherwise documented below in the visit note. 

## 2015-04-22 NOTE — Progress Notes (Signed)
   Subjective:    Patient ID: Benjamin [name], male    DOB: Sep 20, 1940, 75 y.o.   MRN: [id number]  HPI 75 yr old male for a cpx. He feels well in general although he is always stressed between his job and caring for his wife, who has serious medical issues.    Review of Systems  Constitutional: Negative.   HENT: Negative.   Eyes: Negative.   Respiratory: Negative.   Cardiovascular: Negative.   Gastrointestinal: Negative.   Genitourinary: Negative.   Musculoskeletal: Negative.   Skin: Negative.   Neurological: Negative.   Psychiatric/Behavioral: Negative.        Objective:   Physical Exam  Constitutional: He is oriented to person, place, and time. He appears well-developed and well-nourished. No distress.  HENT:  Head: Normocephalic and atraumatic.  Right Ear: External ear normal.  Left Ear: External ear normal.  Nose: Nose normal.  Mouth/Throat: Oropharynx is clear and moist. No oropharyngeal exudate.  Eyes: Conjunctivae and EOM are normal. Pupils are equal, round, and reactive to light. Right eye exhibits no discharge. Left eye exhibits no discharge. No scleral icterus.  Neck: Neck supple. No JVD present. No tracheal deviation present. No thyromegaly present.  Cardiovascular: Normal rate, regular rhythm, normal heart sounds and intact distal pulses.  Exam reveals no gallop and no friction rub.   No murmur heard. Pulmonary/Chest: Effort normal and breath sounds normal. No respiratory distress. He has no wheezes. He has no rales. He exhibits no tenderness.  Abdominal: Soft. Bowel sounds are normal. He exhibits no distension and no mass. There is no tenderness. There is no rebound and no guarding.  Genitourinary: Rectum normal, prostate normal and penis normal. Guaiac negative stool. No penile tenderness.  Musculoskeletal: Normal range of motion. He exhibits no edema or tenderness.  Lymphadenopathy:    He has no cervical adenopathy.  Neurological: He is alert and oriented to  person, place, and time. He has normal reflexes. No cranial nerve deficit. He exhibits normal muscle tone. Coordination normal.  Skin: Skin is warm and dry. No rash noted. He is not diaphoretic. No erythema. No pallor.  Psychiatric: He has a normal mood and affect. His behavior is normal. Judgment and thought content normal.          Assessment & Plan:  Well exam. We discussed diet and exercise.

## 2015-06-10 ENCOUNTER — Ambulatory Visit (AMBULATORY_SURGERY_CENTER): Payer: Self-pay | Admitting: *Deleted

## 2015-06-10 VITALS — Ht 72.0 in | Wt 238.0 lb

## 2015-06-10 DIAGNOSIS — Z86010 Personal history of colonic polyps: Secondary | ICD-10-CM

## 2015-06-10 NOTE — Progress Notes (Signed)
No egg or soy allergy known to patient  No issues with past sedation with any surgeries  or procedures, no intubation problems  No diet pills per patient No home 02 use per patient  No blood thinners per patient  Pt denies issues with constipation   

## 2015-06-11 ENCOUNTER — Encounter: Payer: Self-pay | Admitting: Internal Medicine

## 2015-06-24 ENCOUNTER — Encounter: Payer: Medicare Other | Admitting: Internal Medicine

## 2015-07-10 DIAGNOSIS — M7541 Impingement syndrome of right shoulder: Secondary | ICD-10-CM | POA: Diagnosis not present

## 2015-07-11 ENCOUNTER — Encounter: Payer: Self-pay | Admitting: Internal Medicine

## 2015-07-11 ENCOUNTER — Ambulatory Visit (AMBULATORY_SURGERY_CENTER): Payer: Medicare Other | Admitting: Internal Medicine

## 2015-07-11 VITALS — BP 160/81 | HR 56 | Temp 98.2°F | Resp 14 | Ht 72.0 in | Wt 238.0 lb

## 2015-07-11 DIAGNOSIS — D125 Benign neoplasm of sigmoid colon: Secondary | ICD-10-CM

## 2015-07-11 DIAGNOSIS — Z86010 Personal history of colonic polyps: Secondary | ICD-10-CM

## 2015-07-11 DIAGNOSIS — I10 Essential (primary) hypertension: Secondary | ICD-10-CM | POA: Diagnosis not present

## 2015-07-11 DIAGNOSIS — F419 Anxiety disorder, unspecified: Secondary | ICD-10-CM | POA: Diagnosis not present

## 2015-07-11 DIAGNOSIS — D122 Benign neoplasm of ascending colon: Secondary | ICD-10-CM | POA: Diagnosis not present

## 2015-07-11 DIAGNOSIS — D124 Benign neoplasm of descending colon: Secondary | ICD-10-CM

## 2015-07-11 DIAGNOSIS — K219 Gastro-esophageal reflux disease without esophagitis: Secondary | ICD-10-CM | POA: Diagnosis not present

## 2015-07-11 DIAGNOSIS — E119 Type 2 diabetes mellitus without complications: Secondary | ICD-10-CM | POA: Diagnosis not present

## 2015-07-11 DIAGNOSIS — K589 Irritable bowel syndrome without diarrhea: Secondary | ICD-10-CM | POA: Diagnosis not present

## 2015-07-11 HISTORY — PX: COLONOSCOPY: SHX174

## 2015-07-11 MED ORDER — SODIUM CHLORIDE 0.9 % IV SOLN: 500.0000 mL | INTRAVENOUS | Status: DC

## 2015-07-11 NOTE — Progress Notes (Signed)
Called to room to assist during endoscopic procedure.  Patient ID and intended procedure confirmed with present staff. Received instructions for my participation in the procedure from the performing physician.  

## 2015-07-11 NOTE — Progress Notes (Signed)
To pacu vss patent aw reprot to rn 

## 2015-07-11 NOTE — Patient Instructions (Addendum)
I found and removed 6 polyps - all look benign. I will let you know pathology results and when to have another routine colonoscopy by mail.  I appreciate the opportunity to care for you. Gatha Mayer, MD, [facility]  Hold aspirin and antiinflammatory medications for 2 weeks   YOU HAD AN ENDOSCOPIC PROCEDURE TODAY AT [place]:   Refer to the procedure report that was given to you for any specific questions about what was found during the examination.  If the procedure report does not answer your questions, please call your gastroenterologist to clarify.  If you requested that your care partner not be given the details of your procedure findings, then the procedure report has been included in a sealed envelope for you to review at your convenience later.  YOU SHOULD EXPECT: Some feelings of bloating in the abdomen. Passage of more gas than usual.  Walking can help get rid of the air that was put into your GI tract during the procedure and reduce the bloating. If you had a lower endoscopy (such as a colonoscopy or flexible sigmoidoscopy) you may notice spotting of blood in your stool or on the toilet paper. If you underwent a bowel prep for your procedure, you may not have a normal bowel movement for a few days.  Please Note:  You might notice some irritation and congestion in your nose or some drainage.  This is from the oxygen used during your procedure.  There is no need for concern and it should clear up in a day or so.  SYMPTOMS TO REPORT IMMEDIATELY:   Following lower endoscopy (colonoscopy or flexible sigmoidoscopy):  Excessive amounts of blood in the stool  Significant tenderness or worsening of abdominal pains  Swelling of the abdomen that is new, acute  Fever of 100F or higher   For urgent or emergent issues, a gastroenterologist can be reached at any hour by calling [phone].   DIET: Your first meal following the procedure should be a small meal  and then it is ok to progress to your normal diet. Heavy or fried foods are harder to digest and may make you feel nauseous or bloated.  Likewise, meals heavy in dairy and vegetables can increase bloating.  Drink plenty of fluids but you should avoid alcoholic beverages for 24 hours.  ACTIVITY:  You should plan to take it easy for the rest of today and you should NOT DRIVE or use heavy machinery until tomorrow (because of the sedation medicines used during the test).    FOLLOW UP: Our staff will call the number listed on your records the next business day following your procedure to check on you and address any questions or concerns that you may have regarding the information given to you following your procedure. If we do not reach you, we will leave a message.  However, if you are feeling well and you are not experiencing any problems, there is no need to return our call.  We will assume that you have returned to your regular daily activities without incident.  If any biopsies were taken you will be contacted by phone or by letter within the next 1-3 weeks.  Please call us at [phone] if you have not heard about the biopsies in 3 weeks.    SIGNATURES/CONFIDENTIALITY: You and/or your care partner have signed paperwork which will be entered into your electronic medical record.  These signatures attest to the fact that that the information above  on your After Visit Summary has been reviewed and is understood.  Full responsibility of the confidentiality of this discharge information lies with you and/or your care-partner.    Information on polyps and diverticulosis given to you today

## 2015-07-11 NOTE — Op Note (Addendum)
[place] Patient Name: Benjamin [name] Procedure Date: 07/11/2015 1:51 PM MRN: [id number] Endoscopist: Gatha Mayer , MD Age: 75 Referring MD:  Date of Birth: 17-Dec-1940 Gender: Male Procedure:                Colonoscopy Indications:              Surveillance: Personal history of adenomatous                            polyps on last colonoscopy 5 years ago Medicines:                Propofol per Anesthesia, Monitored Anesthesia Care Procedure:                Pre-Anesthesia Assessment:                           - Prior to the procedure, a History and Physical                            was performed, and patient medications and                            allergies were reviewed. The patient's tolerance of                            previous anesthesia was also reviewed. The risks                            and benefits of the procedure and the sedation                            options and risks were discussed with the patient.                            All questions were answered, and informed consent                            was obtained. Prior Anticoagulants: The patient has                            taken no previous anticoagulant or antiplatelet                            agents. ASA Grade Assessment: III - A patient with                            severe systemic disease. After reviewing the risks                            and benefits, the patient was deemed in                            satisfactory condition to undergo the procedure.  After obtaining informed consent, the colonoscope                            was passed under direct vision. Throughout the                            procedure, the patient's blood pressure, pulse, and                            oxygen saturations were monitored continuously. The                            Model CF-HQ190L [id number]) scope was introduced                            through the anus and  advanced to the the cecum,                            identified by appendiceal orifice and ileocecal                            valve. The colonoscopy was performed without                            difficulty. The patient tolerated the procedure                            well. The bowel preparation used was Miralax. The                            quality of the bowel preparation was excellent. The                            ileocecal valve, appendiceal orifice, and rectum                            were photographed. Scope In: 2:15:31 PM Scope Out: 2:35:09 PM Scope Withdrawal Time: 0 hours 15 minutes 12 seconds  Total Procedure Duration: 0 hours 19 minutes 38 seconds  Findings:                 The perianal and digital rectal examinations were                            normal. Pertinent negatives include normal prostate                            (size, shape, and consistency).                           Five sessile polyps were found in the descending                            colon and ascending colon. The polyps were 3 to 8  mm in size. These polyps were removed with a cold                            snare. Resection and retrieval were complete.                            Verification of patient identification for the                            specimen was done. Estimated blood loss was minimal.                           A 12 mm polyp was found in the sigmoid colon. The                            polyp was sessile. The polyp was removed with a hot                            snare. Resection and retrieval were complete.                            Verification of patient identification for the                            specimen was done. Estimated blood loss was minimal.                           A few small-mouthed diverticula were found in the                            sigmoid colon. There was no evidence of                            diverticular  bleeding.                           The exam was otherwise without abnormality on                            direct and retroflexion views. Complications:            No immediate complications. Estimated Blood Loss:     Estimated blood loss was minimal. Impression:               - Five 3 to 8 mm polyps in the descending colon and                            in the ascending colon, removed with a cold snare.                            Resected and retrieved.                           - One 12 mm polyp in  the sigmoid colon, removed                            with a hot snare. Resected and retrieved.                           - Mild diverticulosis in the sigmoid colon. There                            was no evidence of diverticular bleeding.                           - The examination was otherwise normal on direct                            and retroflexion views.                           - Personal history of colonic polyps. Recommendation:           - Patient has a contact number available for                            emergencies. The signs and symptoms of potential                            delayed complications were discussed with the                            patient. Return to normal activities tomorrow.                            Written discharge instructions were provided to the                            patient.                           - Resume previous diet.                           - Continue present medications.                           - Repeat colonoscopy is recommended. The                            colonoscopy date will be determined after pathology                            results from today's exam become available for                            review. Gatha Mayer, MD 07/11/2015 2:41:52 PM This report has been signed electronically. Addendum Number: 1   Addendum Date: 07/11/2015 2:47:36 PM      No aspirin  or NSAID x 2 weeks. Gatha Mayer, MD 07/11/2015  2:47:51 PM This report has been signed electronically.

## 2015-07-11 NOTE — Progress Notes (Signed)
Pt had a cortisone injection yesterday, 07-10-15 for right shoulder pain.  Ot reported it did help his sx.  I could not find this in the data bank look of  Meds. maw

## 2015-07-12 ENCOUNTER — Telehealth: Payer: Self-pay

## 2015-07-12 ENCOUNTER — Other Ambulatory Visit: Payer: Self-pay | Admitting: Family Medicine

## 2015-07-12 NOTE — Telephone Encounter (Signed)
Okay to refill Alprazolam? 

## 2015-07-12 NOTE — Telephone Encounter (Signed)
Call in #120 with 5 rf 

## 2015-07-12 NOTE — Telephone Encounter (Signed)
  Follow up Call-  Call back number 07/11/2015  Post procedure Call Back phone  # [phone] cell  Permission to leave phone message Yes    Patient was called for follow up after his procedure on 07/11/2015. No answer at the number given for follow up phone call. A message was left on the answering machine.

## 2015-07-22 ENCOUNTER — Encounter: Payer: Self-pay | Admitting: Internal Medicine

## 2015-07-22 DIAGNOSIS — Z86010 Personal history of colonic polyps: Secondary | ICD-10-CM

## 2015-07-22 NOTE — Progress Notes (Signed)
Quick Note:  5 adenomas max 12 mm recall 2020 ______

## 2015-09-02 ENCOUNTER — Encounter: Payer: Self-pay | Admitting: Family Medicine

## 2015-09-02 ENCOUNTER — Ambulatory Visit (INDEPENDENT_AMBULATORY_CARE_PROVIDER_SITE_OTHER): Payer: Medicare Other | Admitting: Family Medicine

## 2015-09-02 VITALS — BP 172/91 | HR 70 | Temp 98.6°F | Ht 72.0 in | Wt 240.0 lb

## 2015-09-02 DIAGNOSIS — J069 Acute upper respiratory infection, unspecified: Secondary | ICD-10-CM

## 2015-09-02 MED ORDER — AZITHROMYCIN 250 MG PO TABS: ORAL_TABLET | ORAL | 0 refills | Status: DC

## 2015-09-02 NOTE — Progress Notes (Signed)
   Subjective:    Patient ID: Benjamin [name], male    DOB: Dec 10, 1940, 75 y.o.   MRN: [id number]  HPI Here for 3 days of fever to 101 degrees, PND, ST, and a dry cough. He started taking a Zpack at home yesterday. Drinking fluids.    Review of Systems  Constitutional: Positive for chills and fever.  HENT: Positive for congestion, postnasal drip and sore throat. Negative for sinus pressure.   Eyes: Negative.   Respiratory: Positive for cough.   Gastrointestinal: Negative.        Objective:   Physical Exam  Constitutional: He appears well-developed and well-nourished.  HENT:  Right Ear: External ear normal.  Left Ear: External ear normal.  Nose: Nose normal.  Mouth/Throat: Oropharynx is clear and moist.  Voice is hoarse   Eyes: Conjunctivae are normal.  Neck: Neck supple. No thyromegaly present.  Pulmonary/Chest: Effort normal and breath sounds normal.  Lymphadenopathy:    He has no cervical adenopathy.          Assessment & Plan:  This is most likely a viral URI but I advised him to go ahead and finish up the [place]. Recheck prn.  Laurey Morale, MD

## 2015-09-02 NOTE — Progress Notes (Signed)
Pre visit review using our clinic review tool, if applicable. No additional management support is needed unless otherwise documented below in the visit note. 

## 2015-09-18 ENCOUNTER — Ambulatory Visit (INDEPENDENT_AMBULATORY_CARE_PROVIDER_SITE_OTHER): Payer: Medicare Other | Admitting: Family Medicine

## 2015-09-18 ENCOUNTER — Encounter: Payer: Self-pay | Admitting: Family Medicine

## 2015-09-18 VITALS — BP 178/89 | HR 64 | Temp 97.9°F | Ht 72.0 in | Wt 240.0 lb

## 2015-09-18 DIAGNOSIS — J019 Acute sinusitis, unspecified: Secondary | ICD-10-CM

## 2015-09-18 MED ORDER — METHYLPREDNISOLONE ACETATE 80 MG/ML IJ SUSP
120.0000 mg | Freq: Once | INTRAMUSCULAR | Status: AC
  Administered 2015-09-18: 120 mg via INTRAMUSCULAR

## 2015-09-18 MED ORDER — OMEPRAZOLE 40 MG PO CPDR: DELAYED_RELEASE_CAPSULE | ORAL | 5 refills | Status: DC

## 2015-09-18 MED ORDER — LEVOFLOXACIN 500 MG PO TABS: 500.0000 mg | ORAL_TABLET | Freq: Every day | ORAL | 0 refills | Status: AC

## 2015-09-18 NOTE — Progress Notes (Signed)
   Subjective:    Patient ID: Isaiahs Franko, male    DOB: 10/20/1940, 75 y.o.   MRN: [id number]  HPI Here for continued sinus pressure, ear pressure, decreased hearing, PND, and a dry cough. He took a Zpack with no improvement. Using Alka Seltzer Plus.    Review of Systems  Constitutional: Negative.   HENT: Positive for congestion, hearing loss, postnasal drip, sinus pressure and voice change. Negative for ear pain and sore throat.   Eyes: Negative.   Respiratory: Positive for cough.        Objective:   Physical Exam  Constitutional: He appears well-developed and well-nourished.  HENT:  Right Ear: External ear normal.  Left Ear: External ear normal.  Nose: Nose normal.  Mouth/Throat: Oropharynx is clear and moist.  Eyes: Conjunctivae are normal.  Neck: No thyromegaly present.  Pulmonary/Chest: Effort normal and breath sounds normal.  Lymphadenopathy:    He has no cervical adenopathy.          Assessment & Plan:  Sinusitis, given a steroid shot and a course of Levaquin. He will monitor his BP at home over the next 3 weeks, and he will return if it remains high like it is today.  Laurey Morale, MD

## 2015-09-18 NOTE — Progress Notes (Signed)
Pre visit review using our clinic review tool, if applicable. No additional management support is needed unless otherwise documented below in the visit note. 

## 2015-09-18 NOTE — Addendum Note (Signed)
Addended by: Aggie Hacker A on: 09/18/2015 03:05 PM   Modules accepted: Orders

## 2015-10-25 DIAGNOSIS — Z23 Encounter for immunization: Secondary | ICD-10-CM | POA: Diagnosis not present

## 2016-01-08 ENCOUNTER — Telehealth: Payer: Self-pay | Admitting: Family Medicine

## 2016-01-08 NOTE — Telephone Encounter (Signed)
Pt states he has been monitoring his bp and it has started running high again. Pt has resumed taking  amLODipine (NORVASC) 10 MG tablet  lisinopril (PRINIVIL,ZESTRIL) 10 MG tablet  Pt states it is running 180/75-80 in the am and goes down as the day goes on.  Today it is 155/67.pt wants to know ifhe shouldcintinue this med and/or do you need to see him.  Pt has been taking both these for 4 days. Please advise

## 2016-01-10 NOTE — Telephone Encounter (Signed)
Please Advise

## 2016-01-13 NOTE — Telephone Encounter (Signed)
Yes he should keep taking both of these daily. If he needs refills, cal in #90 of both. See me OV in 3-4 weeks

## 2016-01-13 NOTE — Telephone Encounter (Signed)
I spoke with pt and went over below information, pt does not need refills at this time.

## 2016-01-20 ENCOUNTER — Other Ambulatory Visit: Payer: Self-pay | Admitting: Family Medicine

## 2016-01-21 NOTE — Telephone Encounter (Signed)
Last filled 07/12/15 X 6 months Last CPX 04/22/15 Seen acutely on 09/18/15 No future appt scheduled Please advise. Thanks!

## 2016-01-21 NOTE — Telephone Encounter (Signed)
Call in #120 with 5 rf 

## 2016-01-22 NOTE — Telephone Encounter (Signed)
Called to the pharmacy and left on machine. 

## 2016-04-17 ENCOUNTER — Other Ambulatory Visit (INDEPENDENT_AMBULATORY_CARE_PROVIDER_SITE_OTHER): Payer: Medicare Other

## 2016-04-17 DIAGNOSIS — Z125 Encounter for screening for malignant neoplasm of prostate: Secondary | ICD-10-CM

## 2016-04-17 DIAGNOSIS — Z0000 Encounter for general adult medical examination without abnormal findings: Secondary | ICD-10-CM | POA: Diagnosis not present

## 2016-04-17 LAB — HEPATIC FUNCTION PANEL
ALT: 18 U/L (ref 0–53)
AST: 14 U/L (ref 0–37)
Albumin: 4.2 g/dL (ref 3.5–5.2)
Alkaline Phosphatase: 59 U/L (ref 39–117)
BILIRUBIN DIRECT: 0.1 mg/dL (ref 0.0–0.3)
Total Bilirubin: 0.9 mg/dL (ref 0.2–1.2)
Total Protein: 6.3 g/dL (ref 6.0–8.3)

## 2016-04-17 LAB — BASIC METABOLIC PANEL
BUN: 17 mg/dL (ref 6–23)
CO2: 26 meq/L (ref 19–32)
Calcium: 9.3 mg/dL (ref 8.4–10.5)
Chloride: 106 mEq/L (ref 96–112)
Creatinine, Ser: 0.96 mg/dL (ref 0.40–1.50)
GFR: 81.13 mL/min (ref >60.00)
Glucose, Bld: 135 mg/dL — ABNORMAL HIGH (ref 70–99)
POTASSIUM: 4 meq/L (ref 3.5–5.1)
Sodium: 140 mEq/L (ref 135–145)

## 2016-04-17 LAB — CBC WITH DIFFERENTIAL/PLATELET
Basophils Absolute: 0 10*3/uL (ref 0.0–0.1)
Basophils Relative: 0.7 % (ref 0.0–3.0)
Eosinophils Absolute: 0.1 10*3/uL (ref 0.0–0.7)
Eosinophils Relative: 2.6 % (ref 0.0–5.0)
HCT: 44.6 % (ref 39.0–52.0)
Hemoglobin: 15.2 g/dL (ref 13.0–17.0)
Lymphocytes Relative: 34.5 % (ref 12.0–46.0)
Lymphs Abs: 1.3 10*3/uL (ref 0.7–4.0)
MCHC: 34.1 g/dL (ref 30.0–36.0)
MCV: 89.4 fl (ref 78.0–100.0)
Monocytes Absolute: 0.4 10*3/uL (ref 0.1–1.0)
Monocytes Relative: 9.2 % (ref 3.0–12.0)
Neutro Abs: 2 10*3/uL (ref 1.4–7.7)
Neutrophils Relative %: 53 % (ref 43.0–77.0)
Platelets: 123 10*3/uL — ABNORMAL LOW (ref 150.0–400.0)
RBC: 4.98 Mil/uL (ref 4.22–5.81)
RDW: 13.8 % (ref 11.5–15.5)
WBC: 3.9 10*3/uL — ABNORMAL LOW (ref 4.0–10.5)

## 2016-04-17 LAB — PSA: PSA: 14.08 ng/mL — ABNORMAL HIGH (ref 0.10–4.00)

## 2016-04-17 LAB — LIPID PANEL
CHOL/HDL RATIO: 6
Cholesterol: 266 mg/dL — ABNORMAL HIGH (ref 0–200)
HDL: 42.7 mg/dL (ref >39.00)
LDL CALC: 191 mg/dL — AB (ref 0–99)
NonHDL: 223.57
TRIGLYCERIDES: 161 mg/dL — AB (ref 0.0–149.0)
VLDL: 32.2 mg/dL (ref 0.0–40.0)

## 2016-04-17 LAB — POC URINALSYSI DIPSTICK (AUTOMATED)
BILIRUBIN UA: NEGATIVE
GLUCOSE UA: NEGATIVE
Ketones, UA: NEGATIVE
LEUKOCYTES UA: NEGATIVE
NITRITE UA: NEGATIVE
Protein, UA: NEGATIVE
Spec Grav, UA: 1.025
Urobilinogen, UA: 1
pH, UA: 6

## 2016-04-17 LAB — HEMOGLOBIN A1C: Hgb A1c MFr Bld: 5.4 % (ref 4.6–6.5)

## 2016-04-17 LAB — TSH: TSH: 2.66 u[IU]/mL (ref 0.35–4.50)

## 2016-04-24 ENCOUNTER — Ambulatory Visit (INDEPENDENT_AMBULATORY_CARE_PROVIDER_SITE_OTHER): Payer: Medicare Other | Admitting: Family Medicine

## 2016-04-24 ENCOUNTER — Encounter: Payer: Self-pay | Admitting: Family Medicine

## 2016-04-24 VITALS — BP 145/70 | HR 65 | Temp 98.1°F | Ht 72.0 in | Wt 235.0 lb

## 2016-04-24 DIAGNOSIS — Z87891 Personal history of nicotine dependence: Secondary | ICD-10-CM | POA: Diagnosis not present

## 2016-04-24 DIAGNOSIS — Z0000 Encounter for general adult medical examination without abnormal findings: Secondary | ICD-10-CM

## 2016-04-24 MED ORDER — AMLODIPINE BESYLATE 10 MG PO TABS: 10.0000 mg | ORAL_TABLET | Freq: Every day | ORAL | 3 refills | Status: DC

## 2016-04-24 MED ORDER — LISINOPRIL 10 MG PO TABS
10.0000 mg | ORAL_TABLET | Freq: Every day | ORAL | 3 refills | Status: DC
Start: 2016-04-24 — End: 2016-12-08

## 2016-04-24 NOTE — Progress Notes (Signed)
   Subjective:    Patient ID: Benjamin [name], male    DOB: 11-Dec-1940, 76 y.o.   MRN: [id number]  HPI 76 yr old male for a well exam. He is doing well in general although he deals with a lot of stress in his life. His wife is disabled and requires almost total care. 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    Review of Systems  Constitutional: Negative.   HENT: Negative.   Eyes: Negative.   Respiratory: Negative.   Cardiovascular: Negative.   Gastrointestinal: Negative.   Genitourinary: Negative.   Musculoskeletal: Negative.   Skin: Negative.   Neurological: Negative.   Psychiatric/Behavioral: Negative.        Objective:   Physical Exam  Constitutional: He is oriented to person, place, and time. He appears well-developed and well-nourished. No distress.  HENT:  Head: Normocephalic and atraumatic.  Right Ear: External ear normal.  Left Ear: External ear normal.  Nose: Nose normal.  Mouth/Throat: Oropharynx is clear and moist. No oropharyngeal exudate.  Eyes: Conjunctivae and EOM are normal. Pupils are equal, round, and reactive to light. Right eye exhibits no discharge. Left eye exhibits no discharge. No scleral icterus.  Neck: Neck supple. No JVD present. No tracheal deviation present. No thyromegaly present.  Cardiovascular: Normal rate, regular rhythm, normal heart sounds and intact distal pulses.  Exam reveals no gallop and no friction rub.   No murmur heard. Pulmonary/Chest: Effort normal and breath sounds normal. No respiratory distress. He has no wheezes. He has no rales. He exhibits no tenderness.  Abdominal: Soft. Bowel sounds are normal. He exhibits no distension and no mass. There is no tenderness. There is no rebound and no guarding.  Genitourinary: Rectum normal, prostate normal and penis normal. Rectal exam shows guaiac negative stool. No penile tenderness.    Musculoskeletal: Normal range of motion. He exhibits no edema or tenderness.  Lymphadenopathy:    He has no cervical adenopathy.  Neurological: He is alert and oriented to person, place, and time. He has normal reflexes. No cranial nerve deficit. He exhibits normal muscle tone. Coordination normal.  Skin: Skin is warm and dry. No rash noted. He is not diaphoretic. No erythema. No pallor.  Psychiatric: He has a normal mood and affect. His behavior is normal. Judgment and thought content normal.          Assessment & Plan:  Well exam. We discussed diet and exercise. He will work to get his LDL down. His PSA remains elevated but it is stable and per his wishes we will simply follow this regularly.  Alysia Penna, MD

## 2016-04-24 NOTE — Progress Notes (Addendum)
Subjective:   Benjamin [name] is a 76 y.o. male who presents for Medicare Annual/Subsequent preventive examination.  Benjamin [name] was seen post visit with Dr. Sarajane Jews   Psychoscocial  Married 34 years Wife is disabled  Has gotten worse over the last 7 years Prognosis; MSA; (usually dx as Parkinson's)  Trying to determine stem cell therapy- mitochondria would help. 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  Still working to pay for care;  Has 2 children  dtr lives in [place] and son lives [place]; Do not participate in care very often   Long term plan is to keep his wife at home with 24/7 care as long as he is able to work  Does not have any end date, but recognizes his fatigue     Health is described as good Diet Traveling:  Breakfast eggs and sausage and bacon, toast Lunch - skips most of the time Supper- eating out Home for 3 months and travel for 3 months   Exercise: Does not exercise If the weather warms up will walk around your home Walk 30 minutes Recommended 35min at a time of brisk walks  Had knee replaced x 1.75 yo / did love snow skiing  But can't do this anymore     Safety Fall hx; no issues  Mood somber but engaging  Do you have little interest or pleasure in doing things?no Have you been feeling down, depressed, hopeless? 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   Health Maintenance Due  Topic Date Due  . FOOT EXAM  02/25/1950  . OPHTHALMOLOGY EXAM  02/25/1950  . TETANUS/TDAP  02/25/1960  . INFLUENZA VACCINE  09/10/2015   Deferred foot exam until the next OV  Discussed and recommended scheduling an office visit   Colonoscopy 07/2015  Discussed AAA due to smoking history; Explained I could order and he can determine apt or discuss with Dr Sarajane Jews  Declines tetanus but educated regarding the tdap  Educated regarding new shingles vaccination  States he has the flu vaccine in October 2017  Tried to enter as was still on the overdue; entered x 2 due to overdue not being updated at the time of the entry      Objective:    Vitals: BP (!) 145/70   Pulse  65   Temp 98.1 F (36.7 C)   Ht 6' (1.829 m)   Wt 235 lb (106.6 kg)   BMI 31.87 kg/m   Body mass index is 31.87 kg/m.  Tobacco History  Smoking Status  . Former Smoker  . Packs/day: 1.50  . Types: Cigarettes  . Quit date: 02/09/1974  Smokeless Tobacco  . Never Used    Comment: quit over 72 yo, absolutely smoked more than 100 cig; referred for AAA      Counseling given: Not Answered   Past Medical History:  Diagnosis Date  . Allergy   . Anxiety   . Arthritis   . BPH (benign prostatic hyperplasia)    with elevated PSA, sees Dr. Diona Fanti, benign  . Diabetes mellitus    borderline -no medicines   . Elevated PSA   . GERD (gastroesophageal reflux disease)   . Hyperlipidemia   . Insomnia   . Platelets decreased ([place])    Past  Surgical History:  Procedure Laterality Date  . CHOLECYSTECTOMY N/A 01/10/2014   Procedure: LAPAROSCOPIC CHOLECYSTECTOMY WITH INTRAOPERATIVE CHOLANGIOGRAM;  Surgeon: Pedro Earls, MD;  Location: [facility];  Service: General;  Laterality: N/A;  . COLONOSCOPY  07/11/2015   per Dr. Carlean Purl, adenomatous polyps, repeat in 3 yrs   . left knee arthroscopy     . LIPOSUCTION  1987  . prostate biopsies in 2003 and again 05/2008     per Dr. Diona Fanti. benign  . REFRACTIVE SURGERY    . repair  of large umblicial hernia     per Dr. Kaylyn Lim on 01/10/10  . right knee arthroscopy    . TONSILLECTOMY    . 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 Alzheimer's disease Other   . Colon cancer Neg Hx   . Esophageal cancer Neg Hx   . Pancreatic cancer Neg Hx   . Prostate cancer Neg Hx   . Rectal cancer Neg Hx   . Stomach cancer Neg Hx    History  Sexual Activity  . Sexual activity: Not on file    Outpatient Encounter Prescriptions as of 04/24/2016  Medication Sig  . alprazolam (XANAX) 2 MG tablet TAKE ONE TABLET BY MOUTH EVERY 6 HOURS AS NEEDED FOR ANXIETY  . amLODipine (NORVASC) 10 MG tablet Take 1 tablet (10 mg total) by mouth daily.  Marland Kitchen lisinopril (PRINIVIL,ZESTRIL) 10 MG tablet Take 1 tablet (10 mg total) by mouth daily.  Marland Kitchen omeprazole (PRILOSEC) 40 MG capsule TAKE ONE CAPSULE BY MOUTH 2 TIMES A DAY  . [DISCONTINUED] amLODipine (NORVASC) 10 MG tablet Take 10 mg by mouth daily.  . [DISCONTINUED] lisinopril (PRINIVIL,ZESTRIL) 10 MG tablet Take 10 mg by mouth daily.   No facility-administered encounter medications on file as of 04/24/2016.     Activities of Daily Living In your present state of health, do you have any difficulty performing the following activities: 04/24/2016  Hearing? N  Vision? N  Difficulty  concentrating or making decisions? N  Walking or climbing stairs? N  Dressing or bathing? N  Doing errands, shopping? N  Preparing Food and eating ? N  Using the Toilet? N  In the past six months, have you accidently leaked urine? N  Do you have problems with loss of bowel control? N  Managing your Medications? N  Managing your  Finances? N  Housekeeping or managing your Housekeeping? N  Some recent data might be hidden    Patient Care Team: Laurey Morale, MD as PCP - General   Assessment:     Exercise Activities and Dietary recommendations Current Exercise Habits: Home exercise routine Encouraged routine walking briskly when he is able Does have fitness center available when traveling but not as motivated now, but did agree to start walking when it got warmer  Goals    . Patient          Discussed learning about the long term care system; Recommended seeking an expert in long term care for information on Medicaid law and resources for those who are disabled.       Fall Risk Fall Risk  04/24/2016 04/24/2016 08/02/2014  Falls in the past year? No No No   Depression Screen PHQ 2/9 Scores 04/24/2016 08/02/2014  PHQ - 2 Score 0 0    Cognitive Function; no issues         Immunization History  Administered Date(s) Administered  . Influenza Whole 03/04/2009  . Influenza,inj,Quad PF,36+ Mos 11/06/2013  . Influenza-Unspecified 11/19/2014, 11/10/2015, 11/10/2015  . PPD Test 07/18/2014  . Pneumococcal Conjugate-13 11/06/2013  . Pneumococcal Polysaccharide-23 03/04/2009   Screening Tests Health Maintenance  Topic Date Due  . FOOT EXAM  02/25/1950  . OPHTHALMOLOGY EXAM  02/25/1950  . TETANUS/TDAP  02/25/1960  . INFLUENZA VACCINE  09/10/2015  . HEMOGLOBIN A1C  10/18/2016  . COLONOSCOPY  07/11/2018  . PNA vac Low Risk Adult  Completed      Plan:      PCP Notes  Health Maintenance Discussed Advanced Directive; To check on LW  Recommended  Vision  check  Discussed AAA due to 100 + smoking hx; will order and he can pursue or discuss with Dr. Sarajane Jews  Foot exam deferred to the next OV; not taking Diabetic meds; no issues voiced   Deferred tdap; given information    Abnormal Screens none   Referrals  None except recommended eye exam   Patient concerns; ongoing work and continued support of wife  Nurse Concerns; Spent time discussing the long term plan system. Very complicated as well as spouse's care is dependent on his health. Discussed services of an eldercare attorney   Next PCP apt TBS       During the course of the visit the patient was educated and counseled about the following appropriate screening and preventive services:   Vaccines to include Pneumoccal, Influenza, Hepatitis B, Td, Zostavax, HCV  Electrocardiogram  Cardiovascular Disease no voiced issues   Colorectal cancer screening due 07/2018  Diabetes screening neg (A1c 5.4)   Prostate Cancer Screening 04/17/2016  Glaucoma screening to have eye exam   Nutrition counseling / on the road; is challenged   Smoking cessation counseling order AAA if he desires to fup  Patient Instructions (the written plan) was given to the patient.    XAJOI,NOMVE, RN  04/24/2016  I have read this note and agree with its contents.  Alysia Penna, MD

## 2016-04-24 NOTE — Patient Instructions (Addendum)
WE NOW OFFER   [name] FAST TRACK!!!  SAME DAY Appointments for ACUTE CARE  Such as: Sprains, Injuries, cuts, abrasions, rashes, muscle pain, joint pain, back pain Colds, flu, sore throats, headache, allergies, cough, fever  Ear pain, sinus and eye infections Abdominal pain, nausea, vomiting, diarrhea, upset stomach Animal/insect bites  3 Easy Ways to Schedule: Walk-In Scheduling Call in scheduling Mychart Sign-up: [email or web address]   Benjamin [name] , Thank you for taking time to come for your Medicare Wellness Visit. I appreciate your ongoing commitment to your health goals. Please review the following plan we discussed and let me know if I can assist you in the future.   Mild Hearing loss  Will schedule an eye exam;   Will have Dr. Sarajane Jews do an eye exam on next OV  A Tetanus is recommended every 10 years. Medicare covers a tetanus if you have a cut or wound; otherwise, there may be a charge. If you had not had a tetanus with pertusses, known as the Tdap, you can take this anytime.     These are the goals we discussed: deferred due to care giving role  Goals    None      This is a list of the screening recommended for you and due dates:  Health Maintenance  Topic Date Due  . Complete foot exam   02/25/1950  . Eye exam for diabetics  02/25/1950  . Tetanus Vaccine  02/25/1960  . Flu Shot  09/10/2015  . Hemoglobin A1C  10/18/2016  . Colon Cancer Screening  07/11/2018  . Pneumonia vaccines  Completed    Health Maintenance, Male A healthy lifestyle and preventive care is important for your health and wellness. Ask your health care provider about what schedule of regular examinations is right for you. What should I know about weight and diet?  Eat a Healthy Diet  Eat plenty of vegetables, fruits, whole grains, low-fat dairy products, and lean protein.  Do not eat a lot of foods high in solid fats, added sugars, or salt. Maintain a Healthy  Weight  Regular exercise can help you achieve or maintain a healthy weight. You should:  Do at least 150 minutes of exercise each week. The exercise should increase your heart rate and make you sweat (moderate-intensity exercise).  Do strength-training exercises at least twice a week. Watch Your Levels of Cholesterol and Blood Lipids  Have your blood tested for lipids and cholesterol every 5 years starting at 75 years of age. If you are at high risk for heart disease, you should start having your blood tested when you are 75 years old. You may need to have your cholesterol levels checked more often if:  Your lipid or cholesterol levels are high.  You are older than 75 years of age.  You are at high risk for heart disease. What should I know about cancer screening? Many types of cancers can be detected early and may often be prevented. Lung Cancer  You should be screened every year for lung cancer if:  You are a current smoker who has smoked for at least 30 years.  You are a former smoker who has quit within the past 15 years.  Talk to your health care provider about your screening options, when you should start screening, and how often you should be screened. Colorectal Cancer  Routine colorectal cancer screening usually begins at 75 years of age and should be repeated every 5-10 years until you are 75 years old. You  may need to be screened more often if early forms of precancerous polyps or small growths are found. Your health care provider may recommend screening at an earlier age if you have risk factors for colon cancer.  Your health care provider may recommend using home test kits to check for hidden blood in the stool.  A small camera at the end of a tube can be used to examine your colon (sigmoidoscopy or colonoscopy). This checks for the earliest forms of colorectal cancer. Prostate and Testicular Cancer  Depending on your age and overall health, your health care provider  may do certain tests to screen for prostate and testicular cancer.  Talk to your health care provider about any symptoms or concerns you have about testicular or prostate cancer. Skin Cancer  Check your skin from head to toe regularly.  Tell your health care provider about any new moles or changes in moles, especially if:  There is a change in a mole's size, shape, or color.  You have a mole that is larger than a pencil eraser.  Always use sunscreen. Apply sunscreen liberally and repeat throughout the day.  Protect yourself by wearing long sleeves, pants, a wide-brimmed hat, and sunglasses when outside. What should I know about heart disease, diabetes, and high blood pressure?  If you are 30-45 years of age, have your blood pressure checked every 3-5 years. If you are 60 years of age or older, have your blood pressure checked every year. You should have your blood pressure measured twice-once when you are at a hospital or clinic, and once when you are not at a hospital or clinic. Record the average of the two measurements. To check your blood pressure when you are not at a hospital or clinic, you can use:  An automated blood pressure machine at a pharmacy.  A home blood pressure monitor.  Talk to your health care provider about your target blood pressure.  If you are between 3-66 years old, ask your health care provider if you should take aspirin to prevent heart disease.  Have regular diabetes screenings by checking your fasting blood sugar level.  If you are at a normal weight and have a low risk for diabetes, have this test once every three years after the age of 49.  If you are overweight and have a high risk for diabetes, consider being tested at a younger age or more often.  A one-time screening for abdominal aortic aneurysm (AAA) by ultrasound is recommended for men aged 60-75 years who are current or former smokers. What should I know about preventing infection? Hepatitis  B  If you have a higher risk for hepatitis B, you should be screened for this virus. Talk with your health care provider to find out if you are at risk for hepatitis B infection. Hepatitis C  Blood testing is recommended for:  Everyone born from [birth year] through 1964.  Anyone with known risk factors for hepatitis C. Sexually Transmitted Diseases (STDs)  You should be screened each year for STDs including gonorrhea and chlamydia if:  You are sexually active and are younger than 75 years of age.  You are older than 75 years of age and your health care provider tells you that you are at risk for this type of infection.  Your sexual activity has changed since you were last screened and you are at an increased risk for chlamydia or gonorrhea. Ask your health care provider if you are at risk.  Talk with  your health care provider about whether you are at high risk of being infected with HIV. Your health care provider may recommend a prescription medicine to help prevent HIV infection. What else can I do?  Schedule regular health, dental, and eye exams.  Stay current with your vaccines (immunizations).  Do not use any tobacco products, such as cigarettes, chewing tobacco, and e-cigarettes. If you need help quitting, ask your health care provider.  Limit alcohol intake to no more than 2 drinks per day. One drink equals 12 ounces of beer, 5 ounces of wine, or 1 ounces of hard liquor.  Do not use street drugs.  Do not share needles.  Ask your health care provider for help if you need support or information about quitting drugs.  Tell your health care provider if you often feel depressed.  Tell your health care provider if you have ever been abused or do not feel safe at home. This information is not intended to replace advice given to you by your health care provider. Make sure you discuss any questions you have with your health care provider. Document Released: 07/25/2007 Document Revised:  09/25/2015 Document Reviewed: 10/30/2014 Elsevier Interactive Patient Education  2017 [place] Prevention in the Home Falls can cause injuries and can affect people from all age groups. There are many simple things that you can do to make your home safe and to help prevent falls. What can I do on the outside of my home?  Regularly repair the edges of walkways and driveways and fix any cracks.  Remove high doorway thresholds.  Trim any shrubbery on the main path into your home.  Use bright outdoor lighting.  Clear walkways of debris and clutter, including tools and rocks.  Regularly check that handrails are securely fastened and in good repair. Both sides of any steps should have handrails.  Install guardrails along the edges of any raised decks or porches.  Have leaves, snow, and ice cleared regularly.  Use sand or salt on walkways during winter months.  In the garage, clean up any spills right away, including grease or oil spills. What can I do in the bathroom?  Use night lights.  Install grab bars by the toilet and in the tub and shower. Do not use towel bars as grab bars.  Use non-skid mats or decals on the floor of the tub or shower.  If you need to sit down while you are in the shower, use a plastic, non-slip stool.  Keep the floor dry. Immediately clean up any water that spills on the floor.  Remove soap buildup in the tub or shower on a regular basis.  Attach bath mats securely with double-sided non-slip rug tape.  Remove throw rugs and other tripping hazards from the floor. What can I do in the bedroom?  Use night lights.  Make sure that a bedside light is easy to reach.  Do not use oversized bedding that drapes onto the floor.  Have a firm chair that has side arms to use for getting dressed.  Remove throw rugs and other tripping hazards from the floor. What can I do in the kitchen?  Clean up any spills right away.  Avoid walking on  wet floors.  Place frequently used items in easy-to-reach places.  If you need to reach for something above you, use a sturdy step stool that has a grab bar.  Keep electrical cables out of the way.  Do not use floor  polish or wax that makes floors slippery. If you have to use wax, make sure that it is non-skid floor wax.  Remove throw rugs and other tripping hazards from the floor. What can I do in the stairways?  Do not leave any items on the stairs.  Make sure that there are handrails on both sides of the stairs. Fix handrails that are broken or loose. Make sure that handrails are as long as the stairways.  Check any carpeting to make sure that it is firmly attached to the stairs. Fix any carpet that is loose or worn.  Avoid having throw rugs at the top or bottom of stairways, or secure the rugs with carpet tape to prevent them from moving.  Make sure that you have a light switch at the top of the stairs and the bottom of the stairs. If you do not have them, have them installed. What are some other fall prevention tips?  Wear closed-toe shoes that fit well and support your feet. Wear shoes that have rubber soles or low heels.  When you use a stepladder, make sure that it is completely opened and that the sides are firmly locked. Have someone hold the ladder while you are using it. Do not climb a closed stepladder.  Add color or contrast paint or tape to grab bars and handrails in your home. Place contrasting color strips on the first and last steps.  Use mobility aids as needed, such as canes, walkers, scooters, and crutches.  Turn on lights if it is dark. Replace any light bulbs that burn out.  Set up furniture so that there are clear paths. Keep the furniture in the same spot.  Fix any uneven floor surfaces.  Choose a carpet design that does not hide the edge of steps of a stairway.  Be aware of any and all pets.  Review your medicines with your healthcare provider. Some  medicines can cause dizziness or changes in blood pressure, which increase your risk of falling. Talk with your health care provider about other ways that you can decrease your risk of falls. This may include working with a physical therapist or trainer to improve your strength, balance, and endurance. This information is not intended to replace advice given to you by your health care provider. Make sure you discuss any questions you have with your health care provider. Document Released: 01/16/2002 Document Revised: 06/25/2015 Document Reviewed: 03/02/2014 Elsevier Interactive Patient Education  2017 [employer].

## 2016-04-24 NOTE — Progress Notes (Signed)
Pre visit review using our clinic review tool, if applicable. No additional management support is needed unless otherwise documented below in the visit note. 

## 2016-05-15 ENCOUNTER — Inpatient Hospital Stay (HOSPITAL_COMMUNITY): Admission: RE | Admit: 2016-05-15 | Payer: Medicare Other | Source: Ambulatory Visit

## 2016-05-21 ENCOUNTER — Telehealth: Payer: Self-pay | Admitting: Family Medicine

## 2016-05-21 NOTE — Telephone Encounter (Signed)
Pt states he is having severe rt hip pain. Pt states hurts about all the time.  Will only be in [place] on Monday and has tried to get into an orthopedic, but they did not have anything Monday.  Pt has to go out of town again on Tuesday.  Pt declined an appt with Dr Sarajane Jews and would like to know if Dr Sarajane Jews could do anything to get him to see someone on Monday, 4/16

## 2016-05-22 NOTE — Telephone Encounter (Signed)
Dr. Sarajane Jews recommends the walk in at [facility] on [address]. I spoke with pt and gave this information.

## 2016-05-25 ENCOUNTER — Other Ambulatory Visit: Payer: Self-pay | Admitting: Family Medicine

## 2016-05-26 ENCOUNTER — Other Ambulatory Visit: Payer: Self-pay | Admitting: Family Medicine

## 2016-05-26 NOTE — Telephone Encounter (Signed)
He already has refills until 07-22-16

## 2016-05-26 NOTE — Telephone Encounter (Signed)
Too soon to refill.

## 2016-06-01 ENCOUNTER — Other Ambulatory Visit: Payer: Self-pay | Admitting: Family Medicine

## 2016-06-01 NOTE — Telephone Encounter (Signed)
NO he is not due until 07-22-16. We have said this several times already

## 2016-06-04 DIAGNOSIS — Z471 Aftercare following joint replacement surgery: Secondary | ICD-10-CM | POA: Diagnosis not present

## 2016-06-04 DIAGNOSIS — M25551 Pain in right hip: Secondary | ICD-10-CM | POA: Diagnosis not present

## 2016-06-04 DIAGNOSIS — Z96652 Presence of left artificial knee joint: Secondary | ICD-10-CM | POA: Diagnosis not present

## 2016-06-04 DIAGNOSIS — M25561 Pain in right knee: Secondary | ICD-10-CM | POA: Diagnosis not present

## 2016-06-05 ENCOUNTER — Inpatient Hospital Stay (HOSPITAL_COMMUNITY): Admission: RE | Admit: 2016-06-05 | Payer: Medicare Other | Source: Ambulatory Visit

## 2016-06-18 DIAGNOSIS — M25551 Pain in right hip: Secondary | ICD-10-CM | POA: Diagnosis not present

## 2016-07-02 ENCOUNTER — Telehealth: Payer: Self-pay | Admitting: Family Medicine

## 2016-07-02 NOTE — Telephone Encounter (Signed)
Refill request for Tramadol 50 mg, having some pain in right shoulder, send to [facility].

## 2016-07-03 MED ORDER — TRAMADOL HCL 50 MG PO TABS: 50.0000 mg | ORAL_TABLET | Freq: Four times a day (QID) | ORAL | 2 refills | Status: DC | PRN

## 2016-07-03 NOTE — Telephone Encounter (Signed)
I spoke with pt and called in script to [facility].

## 2016-07-03 NOTE — Telephone Encounter (Signed)
Call in Tramadol 50 mg to take 1-2 every 6 hours prn pain, #120 with 2 rf

## 2016-07-16 DIAGNOSIS — M7541 Impingement syndrome of right shoulder: Secondary | ICD-10-CM | POA: Diagnosis not present

## 2016-07-16 DIAGNOSIS — M25551 Pain in right hip: Secondary | ICD-10-CM | POA: Diagnosis not present

## 2016-07-16 DIAGNOSIS — M1611 Unilateral primary osteoarthritis, right hip: Secondary | ICD-10-CM | POA: Diagnosis not present

## 2016-07-27 ENCOUNTER — Encounter: Payer: Self-pay | Admitting: Family Medicine

## 2016-07-27 ENCOUNTER — Ambulatory Visit (INDEPENDENT_AMBULATORY_CARE_PROVIDER_SITE_OTHER): Payer: Medicare Other | Admitting: Family Medicine

## 2016-07-27 VITALS — BP 150/67 | HR 82 | Temp 98.3°F | Ht 72.0 in | Wt 240.0 lb

## 2016-07-27 DIAGNOSIS — K219 Gastro-esophageal reflux disease without esophagitis: Secondary | ICD-10-CM | POA: Diagnosis not present

## 2016-07-27 DIAGNOSIS — M25511 Pain in right shoulder: Secondary | ICD-10-CM

## 2016-07-27 DIAGNOSIS — M25551 Pain in right hip: Secondary | ICD-10-CM

## 2016-07-27 DIAGNOSIS — G8929 Other chronic pain: Secondary | ICD-10-CM | POA: Insufficient documentation

## 2016-07-27 DIAGNOSIS — R238 Other skin changes: Secondary | ICD-10-CM | POA: Diagnosis not present

## 2016-07-27 DIAGNOSIS — R233 Spontaneous ecchymoses: Secondary | ICD-10-CM

## 2016-07-27 LAB — CBC WITH DIFFERENTIAL/PLATELET
BASOS ABS: 0 10*3/uL (ref 0.0–0.1)
BASOS PCT: 0.6 % (ref 0.0–3.0)
EOS ABS: 0.1 10*3/uL (ref 0.0–0.7)
Eosinophils Relative: 1.3 % (ref 0.0–5.0)
HCT: 46.8 % (ref 39.0–52.0)
Hemoglobin: 16 g/dL (ref 13.0–17.0)
LYMPHS ABS: 1.6 10*3/uL (ref 0.7–4.0)
Lymphocytes Relative: 28.8 % (ref 12.0–46.0)
MCHC: 34.2 g/dL (ref 30.0–36.0)
MCV: 92 fl (ref 78.0–100.0)
MONO ABS: 0.5 10*3/uL (ref 0.1–1.0)
Monocytes Relative: 8.4 % (ref 3.0–12.0)
NEUTROS ABS: 3.5 10*3/uL (ref 1.4–7.7)
NEUTROS PCT: 60.9 % (ref 43.0–77.0)
PLATELETS: 140 10*3/uL — AB (ref 150.0–400.0)
RBC: 5.09 Mil/uL (ref 4.22–5.81)
RDW: 14.8 % (ref 11.5–15.5)
WBC: 5.7 10*3/uL (ref 4.0–10.5)

## 2016-07-27 LAB — APTT: APTT: 26.2 s (ref 23.4–32.7)

## 2016-07-27 LAB — PROTIME-INR
INR: 1 ratio (ref 0.8–1.0)
Prothrombin Time: 10.5 s (ref 9.6–13.1)

## 2016-07-27 MED ORDER — OMEPRAZOLE 40 MG PO CPDR: DELAYED_RELEASE_CAPSULE | ORAL | 3 refills | Status: DC

## 2016-07-27 NOTE — Patient Instructions (Signed)
WE NOW OFFER   [facility] FAST TRACK!!!  SAME DAY Appointments for ACUTE CARE  Such as: Sprains, Injuries, cuts, abrasions, rashes, muscle pain, joint pain, back pain Colds, flu, sore throats, headache, allergies, cough, fever  Ear pain, sinus and eye infections Abdominal pain, nausea, vomiting, diarrhea, upset stomach Animal/insect bites  3 Easy Ways to Schedule: Walk-In Scheduling Call in scheduling Mychart Sign-up: [email or web address]         

## 2016-07-27 NOTE — Progress Notes (Signed)
   Subjective:    Patient ID: Benjamin [name], male    DOB: 02-12-1940, 76 y.o.   MRN: [id number]  HPI Here for several problems which are under control. He has been having a lot of pain in the right shoulder and right hip, and he has been seeing Dr. Alvan Dame about this. About a week ago he received steroid injections in both joints, and he was started on Diclofenac 75 mg bid. In addition he ran out of Omeprazole about a month ago. Then last week he noticed several bruised areas appeared on both forearms, and this is new for him. Also he had 2 epsidoes of passing some bright red blood per rectum. No abdominal pain. He had a colonoscopy last year which showed some adenomatous polyps. He thought the Diclofenac made have been part of the problem so he stopped taking this 5 days ago. Since then he has had no further bleeding form the rectum and all his bruises have gone away.    Review of Systems     Objective:   Physical Exam  Constitutional: He appears well-developed and well-nourished.  Cardiovascular: Normal rate, regular rhythm, normal heart sounds and intact distal pulses.   Pulmonary/Chest: Effort normal and breath sounds normal. No respiratory distress. He has no wheezes. He has no rales.  Abdominal: Soft. Bowel sounds are normal. He exhibits no distension and no mass. There is no tenderness. There is no rebound and no guarding.  Skin:  Clear, no bruising seen           Assessment & Plan:  I think his bruising and GI bleeding were the result of several confluent things, including having several steroid injections to the joints, taking a potent oral NSAID, and not being on Omeprazole. He will avoid all NSAIDs at this time and he can use Tylenol or Tramadol for pain. He will get back on Omeprazole every day. We will get labs today to rule out any coagulation issues.  Alysia Penna, MD

## 2016-08-13 DIAGNOSIS — M1611 Unilateral primary osteoarthritis, right hip: Secondary | ICD-10-CM | POA: Diagnosis not present

## 2016-08-13 DIAGNOSIS — M25551 Pain in right hip: Secondary | ICD-10-CM | POA: Diagnosis not present

## 2016-08-18 ENCOUNTER — Other Ambulatory Visit: Payer: Self-pay | Admitting: Family Medicine

## 2016-08-18 NOTE — Telephone Encounter (Signed)
Call in #120 with 5 rf 

## 2016-08-19 MED ORDER — ALPRAZOLAM 2 MG PO TABS: 2.0000 mg | ORAL_TABLET | Freq: Four times a day (QID) | ORAL | 5 refills | Status: DC | PRN

## 2016-08-22 DIAGNOSIS — M25551 Pain in right hip: Secondary | ICD-10-CM | POA: Diagnosis not present

## 2016-08-26 DIAGNOSIS — M7541 Impingement syndrome of right shoulder: Secondary | ICD-10-CM | POA: Diagnosis not present

## 2016-09-04 DIAGNOSIS — M1611 Unilateral primary osteoarthritis, right hip: Secondary | ICD-10-CM | POA: Diagnosis not present

## 2016-09-04 DIAGNOSIS — M25551 Pain in right hip: Secondary | ICD-10-CM | POA: Diagnosis not present

## 2016-09-04 DIAGNOSIS — M7541 Impingement syndrome of right shoulder: Secondary | ICD-10-CM | POA: Diagnosis not present

## 2016-10-05 DIAGNOSIS — M7541 Impingement syndrome of right shoulder: Secondary | ICD-10-CM | POA: Diagnosis not present

## 2016-12-08 ENCOUNTER — Other Ambulatory Visit: Payer: Self-pay

## 2016-12-08 MED ORDER — LISINOPRIL 10 MG PO TABS: 10.0000 mg | ORAL_TABLET | Freq: Every day | ORAL | 3 refills | Status: DC

## 2016-12-08 MED ORDER — AMLODIPINE BESYLATE 10 MG PO TABS: 10.0000 mg | ORAL_TABLET | Freq: Every day | ORAL | 3 refills | Status: DC

## 2016-12-25 NOTE — H&P (Signed)
TOTAL HIP ADMISSION H&P  Patient is admitted for right total hip arthroplasty, anterior approach.  Subjective:  Chief Complaint:    Right hip primary OA / pain  HPI: Benjamin [name], 76 y.o. male, has a history of pain and functional disability in the right hip(s) due to arthritis and patient has failed non-surgical conservative treatments for greater than 12 weeks to include NSAID's and/or analgesics and activity modification.  Onset of symptoms was gradual starting 2+ years ago with gradually worsening course since that time.The patient noted no past surgery on the right hip(s).  Patient currently rates pain in the right hip at 6 out of 10 with activity. Patient has worsening of pain with activity and weight bearing, trendelenberg gait, pain that interfers with activities of daily living and pain with passive range of motion. Patient has evidence of periarticular osteophytes and joint space narrowing by imaging studies. This condition presents safety issues increasing the risk of falls.   There is no current active infection.   Risks, benefits and expectations were discussed with the patient.  Risks including but not limited to the risk of anesthesia, blood clots, nerve damage, blood vessel damage, failure of the prosthesis, infection and up to and including death.  Patient understand the risks, benefits and expectations and wishes to proceed with surgery.   PCP: Laurey Morale, MD  D/C Plans:       Home   Post-op Meds:       No Rx given   Tranexamic Acid:      To be given - IV   Decadron:      Is to be given  FYI:     ASA  Tramadol and APAP  DME:   Pt already has equipment   PT:   No PT    Patient Active Problem List   Diagnosis Date Noted  . Chronic right hip pain 07/27/2016  . Depression with anxiety 12/04/2014  . Right shoulder pain 12/04/2014  . HTN (hypertension) 08/02/2014  . Obese 07/18/2014  . S/P left TKA 07/16/2014  . S/P knee replacement 07/16/2014  . S/P laparoscopic  cholecystectomy Dec 2015 01/10/2014  . Unspecified hypothyroidism 07/10/2013  . Diabetes mellitus type 2, controlled, without complications ([place]) [id number]/[id number]  . HYPOGONADISM 03/07/2010  . Personal history of colonic polyps 02/26/2010  . HEMATURIA UNSPECIFIED 01/08/2010  . KNEE PAIN, RIGHT 09/23/2009  . UMB HERNIA WITHOUT MENTION OBSTRUCTION/GANGRENE 08/16/2009  . 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 IRRITABLE BOWEL SYNDROME 09/03/2008  . MALAISE AND FATIGUE 08/17/2008  . ABDOMINAL BLOATING 08/17/2008  . FINGER SPRAIN 04/24/2008  . GERD 08/15/2007  . Anxiety state 02/16/2007  . BENIGN PROSTATIC HYPERTROPHY 02/16/2007  . 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 CHICKENPOX, HX OF 02/16/2007  . PROSTATE SPECIFIC ANTIGEN, ELEVATED 01/26/2007  . HYPERLIPIDEMIA 10/12/2006   Past Medical History:  Diagnosis Date  . Allergy   . Anxiety   . Arthritis   . BPH (benign prostatic hyperplasia)    with elevated PSA, sees Dr. Diona Fanti, benign  . Diabetes mellitus    borderline -no medicines   . Elevated PSA   . GERD (gastroesophageal reflux disease)   . Hyperlipidemia   . Insomnia   . Platelets decreased ([place])     Past Surgical History:  Procedure Laterality Date  . CHOLECYSTECTOMY N/A 01/10/2014   Procedure: LAPAROSCOPIC CHOLECYSTECTOMY WITH INTRAOPERATIVE CHOLANGIOGRAM;  Surgeon: Pedro Earls, MD;  Location: [facility];  Service: General;  Laterality: N/A;  . COLONOSCOPY  07/11/2015  per Dr. Carlean Purl, adenomatous polyps, repeat in 3 yrs   . left knee arthroscopy     . LIPOSUCTION  1987  . prostate biopsies in 2003 and again 05/2008     per Dr. Diona Fanti. benign  . REFRACTIVE SURGERY    . repair  of large umblicial hernia     per Dr. Kaylyn Lim on 01/10/10  . right knee arthroscopy    . TONSILLECTOMY    . TOTAL KNEE ARTHROPLASTY Left 07/16/2014   Procedure: LEFT TOTAL KNEE ARTHROPLASTY;  Surgeon: Paralee Cancel, MD;  Location: [facility];  Service: Orthopedics;  Laterality: Left;     No current facility-administered medications for this encounter.    Current Outpatient Medications  Medication Sig Dispense Refill Last Dose  . alprazolam (XANAX) 2 MG tablet Take 1 tablet (2 mg total) by mouth every 6 (six) hours as needed. for anxiety 120 tablet 5   . amLODipine (NORVASC) 10 MG tablet Take 1 tablet (10 mg total) by mouth daily. 90 tablet 3   . lisinopril (PRINIVIL,ZESTRIL) 10 MG tablet Take 1 tablet (10 mg total) by mouth daily. 90 tablet 3   . omeprazole (PRILOSEC) 40 MG capsule TAKE ONE CAPSULE BY MOUTH 2 TIMES A DAY 180 capsule 3   . traMADol (ULTRAM) 50 MG tablet Take 1-2 tablets (50-100 mg total) by mouth every 6 (six) hours as needed. (Patient not taking: Reported on 07/27/2016) 120 tablet 2 Not Taking   Allergies  Allergen Reactions  . Codeine     hallucinations  . Diclofenac     Easy bruising and GI bleeding   . Norco [Hydrocodone-Acetaminophen]     Hallucinations     Social History   Tobacco Use  . Smoking status: Former Smoker    Packs/day: 1.50    Types: Cigarettes    Last attempt to quit: 02/09/1974    Years since quitting: 42.9  . Smokeless tobacco: Never Used  . Tobacco comment: quit over 52 yo, absolutely smoked more than 100 cig; referred for [employer]   Substance Use Topics  . Alcohol use: Yes    Alcohol/week: 4.2 oz    Types: 7 Shots of liquor per week    Family History  Problem Relation Age of Onset  . Alzheimer's disease Other   . Colon cancer Neg Hx   . Esophageal cancer Neg Hx   . Pancreatic cancer Neg Hx   . Prostate cancer Neg Hx   . Rectal cancer Neg Hx   . Stomach cancer Neg Hx      Review of Systems  Constitutional: Negative.   HENT: Negative.   Eyes: Negative.   Respiratory: Negative.   Cardiovascular: Negative.   Gastrointestinal: Positive for heartburn.  Genitourinary: Negative.   Musculoskeletal: Positive for joint pain.  Skin: Negative.   Neurological: Negative.   Endo/Heme/Allergies: Negative.    Psychiatric/Behavioral: Positive for depression. The patient is nervous/anxious.     Objective:  Physical Exam  Constitutional: He is oriented to person, place, and time. He appears well-developed.  HENT:  Head: Normocephalic.  Eyes: Pupils are equal, round, and reactive to light.  Neck: Neck supple. No JVD present. No tracheal deviation present. No thyromegaly present.  Cardiovascular: Normal rate, regular rhythm and intact distal pulses.  Respiratory: Effort normal and breath sounds normal. No respiratory distress. He has no wheezes.  GI: Soft. There is no tenderness. There is no guarding.  Musculoskeletal:       Right hip: He exhibits decreased range of motion, decreased  strength, tenderness and bony tenderness. He exhibits no swelling, no deformity and no laceration.  Lymphadenopathy:    He has no cervical adenopathy.  Neurological: He is alert and oriented to person, place, and time.  Skin: Skin is warm and dry.  Psychiatric: He has a normal mood and affect.      Labs:  Estimated body mass index is 32.55 kg/m as calculated from the following:   Height as of 07/27/16: 6' (1.829 m).   Weight as of 07/27/16: 108.9 kg (240 lb).   Imaging Review Plain radiographs demonstrate severe degenerative joint disease of the right hip(s). The bone quality appears to be good for age and reported activity level.  Assessment/Plan:  End stage arthritis, right hip(s)  The patient history, physical examination, clinical judgement of the provider and imaging studies are consistent with end stage degenerative joint disease of the right hip(s) and total hip arthroplasty is deemed medically necessary. The treatment options including medical management, injection therapy, arthroscopy and arthroplasty were discussed at length. The risks and benefits of total hip arthroplasty were presented and reviewed. The risks due to aseptic loosening, infection, stiffness, dislocation/subluxation,   thromboembolic complications and other imponderables were discussed.  The patient acknowledged the explanation, agreed to proceed with the plan and consent was signed. Patient is being admitted for inpatient treatment for surgery, pain control, PT, OT, prophylactic antibiotics, VTE prophylaxis, progressive ambulation and ADL's and discharge planning.The patient is planning to be discharged home.    [place] Duane Trias   PA-C  12/25/2016, 9:21 AM

## 2016-12-28 ENCOUNTER — Encounter (HOSPITAL_COMMUNITY): Payer: Self-pay

## 2016-12-28 ENCOUNTER — Telehealth: Payer: Self-pay | Admitting: Family Medicine

## 2016-12-28 NOTE — Patient Instructions (Addendum)
Benjamin [name]  12/28/2016   Your procedure is scheduled on: 01-05-17  Report to [facility] Main  Entrance              Report to admitting at      [facility]   Call this number if you have problems the morning of surgery  [phone]   Remember: ONLY 1 PERSON MAY GO WITH YOU TO SHORT STAY TO GET  READY MORNING OF YOUR SURGERY.  Do not eat food or drink liquids :After Midnight.     Take these medicines the morning of surgery with A SIP OF WATER: amlodipine, omeprazole, xanax if needed DO NOT TAKE ANY DIABETIC MEDICATIONS DAY OF YOUR SURGERY                               You may not have any metal on your body including hair pins and              piercings  Do not wear jewelry,lotions, powders or perfumes, deodorant    .              Men may shave face and neck.   Do not bring valuables to the hospital. [place].  Contacts, dentures or bridgework may not be worn into surgery.                Please read over the following fact sheets you were given: _____________________________________________________________________          [facility] - Preparing for Surgery Before surgery, you can play an important role.  Because skin is not sterile, your skin needs to be as free of germs as possible.  You can reduce the number of germs on your skin by washing with CHG (chlorahexidine gluconate) soap before surgery.  CHG is an antiseptic cleaner which kills germs and bonds with the skin to continue killing germs even after washing. Please DO NOT use if you have an allergy to CHG or antibacterial soaps.  If your skin becomes reddened/irritated stop using the CHG and inform your nurse when you arrive at Short Stay. Do not shave (including legs and underarms) for at least 48 hours prior to the first CHG shower.  You may shave your face/neck. Please follow these instructions carefully:  1.  Shower with CHG Soap the night before  surgery and the  morning of Surgery.  2.  If you choose to wash your hair, wash your hair first as usual with your  normal  shampoo.  3.  After you shampoo, rinse your hair and body thoroughly to remove the  shampoo.                           4.  Use CHG as you would any other liquid soap.  You can apply chg directly  to the skin and wash                       Gently with a scrungie or clean washcloth.  5.  Apply the CHG Soap to your body ONLY FROM THE NECK DOWN.   Do not use on face/ open  Wound or open sores. Avoid contact with eyes, ears mouth and genitals (private parts).                       Wash face,  Genitals (private parts) with your normal soap.             6.  Wash thoroughly, paying special attention to the area where your surgery  will be performed.  7.  Thoroughly rinse your body with warm water from the neck down.  8.  DO NOT shower/wash with your normal soap after using and rinsing off  the CHG Soap.                9.  Pat yourself dry with a clean towel.            10.  Wear clean pajamas.            11.  Place clean sheets on your bed the night of your first shower and do not  sleep with pets. Day of Surgery : Do not apply any lotions/deodorants the morning of surgery.  Please wear clean clothes to the hospital/surgery center.  FAILURE TO FOLLOW THESE INSTRUCTIONS MAY RESULT IN THE CANCELLATION OF YOUR SURGERY PATIENT SIGNATURE_________________________________  NURSE SIGNATURE__________________________________  ________________________________________________________________________  WHAT IS A BLOOD TRANSFUSION? Blood Transfusion Information  A transfusion is the replacement of blood or some of its parts. Blood is made up of multiple cells which provide different functions.  Red blood cells carry oxygen and are used for blood loss replacement.  White blood cells fight against infection.  Platelets control bleeding.  Plasma helps clot  blood.  Other blood products are available for specialized needs, such as hemophilia or other clotting disorders. BEFORE THE TRANSFUSION  Who gives blood for transfusions?   Healthy volunteers who are fully evaluated to make sure their blood is safe. This is blood bank blood. Transfusion therapy is the safest it has ever been in the practice of medicine. Before blood is taken from a donor, a complete history is taken to make sure that person has no history of diseases nor engages in risky social behavior (examples are intravenous drug use or sexual activity with multiple partners). The donor's travel history is screened to minimize risk of transmitting infections, such as malaria. The donated blood is tested for signs of infectious diseases, such as HIV and hepatitis. The blood is then tested to be sure it is compatible with you in order to minimize the chance of a transfusion reaction. If you or a relative donates blood, this is often done in anticipation of surgery and is not appropriate for emergency situations. It takes many days to process the donated blood. RISKS AND COMPLICATIONS Although transfusion therapy is very safe and saves many lives, the main dangers of transfusion include:   Getting an infectious disease.  Developing a transfusion reaction. This is an allergic reaction to something in the blood you were given. Every precaution is taken to prevent this. The decision to have a blood transfusion has been considered carefully by your caregiver before blood is given. Blood is not given unless the benefits outweigh the risks. AFTER THE TRANSFUSION  Right after receiving a blood transfusion, you will usually feel much better and more energetic. This is especially true if your red blood cells have gotten low (anemic). The transfusion raises the level of the red blood cells which carry oxygen, and this usually causes an energy increase.  The  nurse administering the transfusion will monitor  you carefully for complications. HOME CARE INSTRUCTIONS  No special instructions are needed after a transfusion. You may find your energy is better. Speak with your caregiver about any limitations on activity for underlying diseases you may have. SEEK MEDICAL CARE IF:   Your condition is not improving after your transfusion.  You develop redness or irritation at the intravenous (IV) site. SEEK IMMEDIATE MEDICAL CARE IF:  Any of the following symptoms occur over the next 12 hours:  Shaking chills.  You have a temperature by mouth above 102 F (38.9 C), not controlled by medicine.  Chest, back, or muscle pain.  People around you feel you are not acting correctly or are confused.  Shortness of breath or difficulty breathing.  Dizziness and fainting.  You get a rash or develop hives.  You have a decrease in urine output.  Your urine turns a dark color or changes to pink, red, or brown. Any of the following symptoms occur over the next 10 days:  You have a temperature by mouth above 102 F (38.9 C), not controlled by medicine.  Shortness of breath.  Weakness after normal activity.  The white part of the eye turns yellow (jaundice).  You have a decrease in the amount of urine or are urinating less often.  Your urine turns a dark color or changes to pink, red, or brown. Document Released: 01/24/2000 Document Revised: 04/20/2011 Document Reviewed: 09/12/2007 ExitCare Patient Information 2014 [place].  _______________________________________________________________________  Incentive Spirometer  An incentive spirometer is a tool that can help keep your lungs clear and active. This tool measures how well you are filling your lungs with each breath. Taking long deep breaths may help reverse or decrease the chance of developing breathing (pulmonary) problems (especially infection) following:  A long period of time when you are unable to move or be active. BEFORE THE  PROCEDURE   If the spirometer includes an indicator to show your best effort, your nurse or respiratory therapist will set it to a desired goal.  If possible, sit up straight or lean slightly forward. Try not to slouch.  Hold the incentive spirometer in an upright position. INSTRUCTIONS FOR USE  1. Sit on the edge of your bed if possible, or sit up as far as you can in bed or on a chair. 2. Hold the incentive spirometer in an upright position. 3. Breathe out normally. 4. Place the mouthpiece in your mouth and seal your lips tightly around it. 5. Breathe in slowly and as deeply as possible, raising the piston or the ball toward the top of the column. 6. Hold your breath for 3-5 seconds or for as long as possible. Allow the piston or ball to fall to the bottom of the column. 7. Remove the mouthpiece from your mouth and breathe out normally. 8. Rest for a few seconds and repeat Steps 1 through 7 at least 10 times every 1-2 hours when you are awake. Take your time and take a few normal breaths between deep breaths. 9. The spirometer may include an indicator to show your best effort. Use the indicator as a goal to work toward during each repetition. 10. After each set of 10 deep breaths, practice coughing to be sure your lungs are clear. If you have an incision (the cut made at the time of surgery), support your incision when coughing by placing a pillow or rolled up towels firmly against it. Once you are able to get  out of bed, walk around indoors and cough well. You may stop using the incentive spirometer when instructed by your caregiver.  RISKS AND COMPLICATIONS  Take your time so you do not get dizzy or light-headed.  If you are in pain, you may need to take or ask for pain medication before doing incentive spirometry. It is harder to take a deep breath if you are having pain. AFTER USE  Rest and breathe slowly and easily.  It can be helpful to keep track of a log of your progress. Your  caregiver can provide you with a simple table to help with this. If you are using the spirometer at home, follow these instructions: [place] IF:   You are having difficultly using the spirometer.  You have trouble using the spirometer as often as instructed.  Your pain medication is not giving enough relief while using the spirometer.  You develop fever of 100.5 F (38.1 C) or higher. SEEK IMMEDIATE MEDICAL CARE IF:   You cough up bloody sputum that had not been present before.  You develop fever of 102 F (38.9 C) or greater.  You develop worsening pain at or near the incision site. MAKE SURE YOU:   Understand these instructions.  Will watch your condition.  Will get help right away if you are not doing well or get worse. Document Released: 06/08/2006 Document Revised: 04/20/2011 Document Reviewed: 08/09/2006 [employer] Patient Information 2014 [place], [place].   ________________________________________________________________________

## 2016-12-28 NOTE — Telephone Encounter (Signed)
Copied from [place] [id number]. Topic: General - Other >> Dec 25, 2016 10:25 AM Cox, Melburn Hake, CMA wrote: Reason for CRM: Pt dropped off Surgical Clearance forms for completion. These need to be faxed to [phone] when complete.

## 2016-12-28 NOTE — Telephone Encounter (Signed)
I do not have the forms that need to be completed. Pt does have an OV with Dr. Sarajane Jews will discuss this matter with pt at there [place].

## 2016-12-29 ENCOUNTER — Encounter (HOSPITAL_COMMUNITY): Payer: Self-pay

## 2016-12-29 ENCOUNTER — Telehealth: Payer: Self-pay | Admitting: Family Medicine

## 2016-12-29 ENCOUNTER — Ambulatory Visit: Payer: Medicare Other | Admitting: Family Medicine

## 2016-12-29 ENCOUNTER — Other Ambulatory Visit: Payer: Self-pay

## 2016-12-29 ENCOUNTER — Encounter (HOSPITAL_COMMUNITY)
Admission: RE | Admit: 2016-12-29 | Discharge: 2016-12-29 | Disposition: A | Discharge status: Home / Self Care | Payer: Medicare Other | Source: Ambulatory Visit | Attending: Orthopedic Surgery | Admitting: Orthopedic Surgery

## 2016-12-29 DIAGNOSIS — M1611 Unilateral primary osteoarthritis, right hip: Secondary | ICD-10-CM | POA: Diagnosis not present

## 2016-12-29 DIAGNOSIS — Z79899 Other long term (current) drug therapy: Secondary | ICD-10-CM | POA: Diagnosis not present

## 2016-12-29 DIAGNOSIS — Z0183 Encounter for blood typing: Secondary | ICD-10-CM | POA: Diagnosis not present

## 2016-12-29 DIAGNOSIS — I440 Atrioventricular block, first degree: Secondary | ICD-10-CM | POA: Insufficient documentation

## 2016-12-29 DIAGNOSIS — Z01818 Encounter for other preprocedural examination: Secondary | ICD-10-CM | POA: Diagnosis not present

## 2016-12-29 DIAGNOSIS — Z01812 Encounter for preprocedural laboratory examination: Secondary | ICD-10-CM | POA: Diagnosis not present

## 2016-12-29 HISTORY — DX: Pneumonia, unspecified organism: J18.9

## 2016-12-29 HISTORY — DX: Prediabetes: R73.03

## 2016-12-29 LAB — BASIC METABOLIC PANEL
Anion gap: 8 (ref 5–15)
BUN: 17 mg/dL (ref 6–20)
CALCIUM: 9.6 mg/dL (ref 8.9–10.3)
CHLORIDE: 105 mmol/L (ref 101–111)
CO2: 27 mmol/L (ref 22–32)
CREATININE: 0.9 mg/dL (ref 0.61–1.24)
Glucose, Bld: 107 mg/dL — ABNORMAL HIGH (ref 65–99)
Potassium: 3.9 mmol/L (ref 3.5–5.1)
SODIUM: 140 mmol/L (ref 135–145)

## 2016-12-29 LAB — CBC
HCT: 45.4 % (ref 39.0–52.0)
Hemoglobin: 15.5 g/dL (ref 13.0–17.0)
MCH: 31.6 pg (ref 26.0–34.0)
MCHC: 34.1 g/dL (ref 30.0–36.0)
MCV: 92.5 fL (ref 78.0–100.0)
PLATELETS: 112 10*3/uL — AB (ref 150–400)
RBC: 4.91 MIL/uL (ref 4.22–5.81)
RDW: 12.9 % (ref 11.5–15.5)
WBC: 4.8 10*3/uL (ref 4.0–10.5)

## 2016-12-29 LAB — SURGICAL PCR SCREEN
MRSA, PCR: NEGATIVE
STAPHYLOCOCCUS AUREUS: NEGATIVE

## 2016-12-29 LAB — HEMOGLOBIN A1C
HEMOGLOBIN A1C: 5.5 % (ref 4.8–5.6)
MEAN PLASMA GLUCOSE: 111.15 mg/dL

## 2016-12-29 NOTE — Progress Notes (Signed)
LVM with Orson Slick for clearance to be faxed to [phone].

## 2016-12-29 NOTE — Telephone Encounter (Signed)
Spoke with Dr. Sarajane Jews this form was done and completed. Placed to be faxed by the ladies up front.

## 2016-12-29 NOTE — Telephone Encounter (Signed)
Copied from [place] [id number]. Topic: Quick Communication - Patient Running Late >> Dec 29, 2016  3:16 PM Oneta Rack wrote: Relation to pt: self  Call back number: [phone]    Reason for call:  Patient currently at [facility] lab and there running behind, patient states he has a 3:45 with Dr. Sarajane Jews today and states he will arrive approximately at 4pm, please advise if patient will be seen.

## 2016-12-29 NOTE — Telephone Encounter (Signed)
Further notes in CRM. TE created in error.

## 2016-12-30 ENCOUNTER — Encounter: Payer: Self-pay | Admitting: Family Medicine

## 2016-12-30 ENCOUNTER — Ambulatory Visit: Payer: Medicare Other | Admitting: Family Medicine

## 2016-12-30 VITALS — BP 134/70 | HR 75 | Temp 97.4°F | Wt 243.2 lb

## 2016-12-30 DIAGNOSIS — Z23 Encounter for immunization: Secondary | ICD-10-CM | POA: Diagnosis not present

## 2016-12-30 DIAGNOSIS — F411 Generalized anxiety disorder: Secondary | ICD-10-CM | POA: Diagnosis not present

## 2016-12-30 DIAGNOSIS — G4700 Insomnia, unspecified: Secondary | ICD-10-CM

## 2016-12-30 DIAGNOSIS — I10 Essential (primary) hypertension: Secondary | ICD-10-CM | POA: Diagnosis not present

## 2016-12-30 MED ORDER — ZOLPIDEM TARTRATE 10 MG PO TABS: 10.0000 mg | ORAL_TABLET | Freq: Every evening | ORAL | 5 refills | Status: DC | PRN

## 2016-12-30 NOTE — Progress Notes (Signed)
Final EKG done 12/29/16-epic

## 2016-12-30 NOTE — Progress Notes (Signed)
   Subjective:    Patient ID: Benjamin [name], male    DOB: 1940/12/05, 76 y.o.   MRN: [id number]  HPI Here to follow up on several issues. His BP has been stable at home. He has been under a lot of stress and he is having a lot of trouble sleeping. Temazepam no longer helps and he wants to try Ambien again. His wife (whi has MS) has been declining rapidly and she has had several recent bouts of pneumonia. She now has a tracheostomy and is totally bedridden. She has no control of bowels or bladder. Nazaire does have nursing aides to help him, but this has been a large burden to him. He uses Xanax 2-3 times a day. He has also been struggling with right hip pain and will be having a total arthroplasty sometime soon.    Review of Systems  Constitutional: Negative.   Respiratory: Negative.   Cardiovascular: Negative.   Neurological: Negative.   Psychiatric/Behavioral: Positive for dysphoric mood and sleep disturbance. Negative for agitation, confusion, decreased concentration and hallucinations. The patient is nervous/anxious.        Objective:   Physical Exam  Constitutional: He is oriented to person, place, and time. He appears well-developed and well-nourished.  Cardiovascular: Normal rate, regular rhythm, normal heart sounds and intact distal pulses.  Pulmonary/Chest: Effort normal and breath sounds normal. No respiratory distress. He has no wheezes. He has no rales.  Neurological: He is alert and oriented to person, place, and time.  Psychiatric: His behavior is normal. Thought content normal.  Anxious           Assessment & Plan:  His HTN is stable. He will have hip surgery as above. His anxiety is stable but insomnia has been a problem. He will switch back to using Ambien 10mg  qhs. Alysia Penna, MD

## 2016-12-30 NOTE — Progress Notes (Signed)
LOV - Dr Fry-12/30/16-epic

## 2017-01-04 NOTE — Progress Notes (Signed)
Requested clearance note from Orson Slick at [facility] .  LVMM

## 2017-01-05 ENCOUNTER — Encounter (HOSPITAL_COMMUNITY)
Admission: RE | Disposition: A | Discharge status: Home / Self Care | Payer: Self-pay | Source: Ambulatory Visit | Attending: Orthopedic Surgery

## 2017-01-05 ENCOUNTER — Other Ambulatory Visit: Payer: Self-pay

## 2017-01-05 ENCOUNTER — Inpatient Hospital Stay (HOSPITAL_COMMUNITY): Payer: Medicare Other | Admitting: Anesthesiology

## 2017-01-05 ENCOUNTER — Inpatient Hospital Stay (HOSPITAL_COMMUNITY): Payer: Medicare Other

## 2017-01-05 ENCOUNTER — Inpatient Hospital Stay (HOSPITAL_COMMUNITY)
Admission: RE | Admit: 2017-01-05 | Discharge: 2017-01-06 | DRG: 470 | Disposition: A | Discharge status: Home / Self Care | Payer: Medicare Other | Source: Ambulatory Visit | Attending: Orthopedic Surgery | Admitting: Orthopedic Surgery

## 2017-01-05 ENCOUNTER — Encounter (HOSPITAL_COMMUNITY): Payer: Self-pay

## 2017-01-05 DIAGNOSIS — F419 Anxiety disorder, unspecified: Secondary | ICD-10-CM | POA: Diagnosis present

## 2017-01-05 DIAGNOSIS — R7303 Prediabetes: Secondary | ICD-10-CM | POA: Diagnosis present

## 2017-01-05 DIAGNOSIS — E785 Hyperlipidemia, unspecified: Secondary | ICD-10-CM | POA: Diagnosis present

## 2017-01-05 DIAGNOSIS — Z6832 Body mass index (BMI) 32.0-32.9, adult: Secondary | ICD-10-CM

## 2017-01-05 DIAGNOSIS — Z96652 Presence of left artificial knee joint: Secondary | ICD-10-CM | POA: Diagnosis not present

## 2017-01-05 DIAGNOSIS — Z87891 Personal history of nicotine dependence: Secondary | ICD-10-CM | POA: Diagnosis not present

## 2017-01-05 DIAGNOSIS — Z79899 Other long term (current) drug therapy: Secondary | ICD-10-CM

## 2017-01-05 DIAGNOSIS — Z885 Allergy status to narcotic agent status: Secondary | ICD-10-CM | POA: Diagnosis not present

## 2017-01-05 DIAGNOSIS — N400 Enlarged prostate without lower urinary tract symptoms: Secondary | ICD-10-CM | POA: Diagnosis not present

## 2017-01-05 DIAGNOSIS — Z888 Allergy status to other drugs, medicaments and biological substances status: Secondary | ICD-10-CM

## 2017-01-05 DIAGNOSIS — M1611 Unilateral primary osteoarthritis, right hip: Secondary | ICD-10-CM | POA: Diagnosis not present

## 2017-01-05 DIAGNOSIS — E669 Obesity, unspecified: Secondary | ICD-10-CM | POA: Diagnosis present

## 2017-01-05 DIAGNOSIS — Z96641 Presence of right artificial hip joint: Secondary | ICD-10-CM | POA: Diagnosis not present

## 2017-01-05 DIAGNOSIS — G4700 Insomnia, unspecified: Secondary | ICD-10-CM | POA: Diagnosis not present

## 2017-01-05 DIAGNOSIS — Z96649 Presence of unspecified artificial hip joint: Secondary | ICD-10-CM

## 2017-01-05 DIAGNOSIS — Z471 Aftercare following joint replacement surgery: Secondary | ICD-10-CM | POA: Diagnosis not present

## 2017-01-05 DIAGNOSIS — K219 Gastro-esophageal reflux disease without esophagitis: Secondary | ICD-10-CM | POA: Diagnosis present

## 2017-01-05 HISTORY — PX: TOTAL HIP ARTHROPLASTY: SHX124

## 2017-01-05 LAB — TYPE AND SCREEN
ABO/RH(D): A POS
Antibody Screen: NEGATIVE

## 2017-01-05 SURGERY — ARTHROPLASTY, HIP, TOTAL, ANTERIOR APPROACH
Anesthesia: Spinal | Site: Hip | Laterality: Right

## 2017-01-05 MED ORDER — POLYETHYLENE GLYCOL 3350 17 G PO PACK
17.0000 g | PACK | Freq: Two times a day (BID) | ORAL | Status: DC
  Administered 2017-01-05 – 2017-01-06 (×2): 17 g via ORAL
  Filled 2017-01-05 (×2): qty 1

## 2017-01-05 MED ORDER — FERROUS SULFATE 325 (65 FE) MG PO TABS
325.0000 mg | ORAL_TABLET | Freq: Three times a day (TID) | ORAL | Status: DC
  Administered 2017-01-06 (×2): 325 mg via ORAL
  Filled 2017-01-05 (×2): qty 1

## 2017-01-05 MED ORDER — AMLODIPINE BESYLATE 10 MG PO TABS
10.0000 mg | ORAL_TABLET | Freq: Every day | ORAL | Status: DC
  Administered 2017-01-05 – 2017-01-06 (×2): 10 mg via ORAL
  Filled 2017-01-05 (×2): qty 1

## 2017-01-05 MED ORDER — NON FORMULARY: 40.0000 mg | Freq: Two times a day (BID) | Status: DC

## 2017-01-05 MED ORDER — OMEPRAZOLE 20 MG PO CPDR
40.0000 mg | DELAYED_RELEASE_CAPSULE | Freq: Two times a day (BID) | ORAL | Status: DC
  Administered 2017-01-05 – 2017-01-06 (×2): 40 mg via ORAL
  Filled 2017-01-05 (×2): qty 2

## 2017-01-05 MED ORDER — HYDROMORPHONE HCL 1 MG/ML IJ SOLN
INTRAMUSCULAR | Status: AC
  Administered 2017-01-05: 0.25 mg via INTRAVENOUS
  Filled 2017-01-05: qty 1

## 2017-01-05 MED ORDER — MAGNESIUM CITRATE PO SOLN: 1.0000 | Freq: Once | ORAL | Status: DC | PRN

## 2017-01-05 MED ORDER — STERILE WATER FOR IRRIGATION IR SOLN
Status: DC | PRN
  Administered 2017-01-05: 2000 mL

## 2017-01-05 MED ORDER — PROPOFOL 10 MG/ML IV BOLUS
INTRAVENOUS | Status: AC
  Filled 2017-01-05: qty 20

## 2017-01-05 MED ORDER — TRAMADOL HCL 50 MG PO TABS: 50.0000 mg | ORAL_TABLET | Freq: Four times a day (QID) | ORAL | 0 refills | Status: AC | PRN

## 2017-01-05 MED ORDER — PHENOL 1.4 % MT LIQD
1.0000 | OROMUCOSAL | Status: DC | PRN
  Filled 2017-01-05: qty 177

## 2017-01-05 MED ORDER — ONDANSETRON HCL 4 MG PO TABS: 4.0000 mg | ORAL_TABLET | Freq: Four times a day (QID) | ORAL | Status: DC | PRN

## 2017-01-05 MED ORDER — ALPRAZOLAM 1 MG PO TABS
2.0000 mg | ORAL_TABLET | Freq: Two times a day (BID) | ORAL | Status: DC | PRN
  Administered 2017-01-05: 2 mg via ORAL
  Filled 2017-01-05: qty 2

## 2017-01-05 MED ORDER — METHOCARBAMOL 500 MG PO TABS
500.0000 mg | ORAL_TABLET | Freq: Four times a day (QID) | ORAL | Status: DC | PRN
  Administered 2017-01-05 – 2017-01-06 (×2): 500 mg via ORAL
  Filled 2017-01-05 (×2): qty 1

## 2017-01-05 MED ORDER — DEXAMETHASONE SODIUM PHOSPHATE 10 MG/ML IJ SOLN
10.0000 mg | Freq: Once | INTRAMUSCULAR | Status: AC
  Administered 2017-01-05: 10 mg via INTRAVENOUS

## 2017-01-05 MED ORDER — CEFAZOLIN SODIUM-DEXTROSE 2-4 GM/100ML-% IV SOLN
2.0000 g | INTRAVENOUS | Status: AC
  Administered 2017-01-05: 2 g via INTRAVENOUS

## 2017-01-05 MED ORDER — FENTANYL CITRATE (PF) 100 MCG/2ML IJ SOLN
INTRAMUSCULAR | Status: AC
  Filled 2017-01-05: qty 2

## 2017-01-05 MED ORDER — DIPHENHYDRAMINE HCL 12.5 MG/5ML PO ELIX: 12.5000 mg | ORAL_SOLUTION | ORAL | Status: DC | PRN

## 2017-01-05 MED ORDER — MIDAZOLAM HCL 2 MG/2ML IJ SOLN
INTRAMUSCULAR | Status: AC
  Filled 2017-01-05: qty 2

## 2017-01-05 MED ORDER — CHLORHEXIDINE GLUCONATE 4 % EX LIQD: 60.0000 mL | Freq: Once | CUTANEOUS | Status: DC

## 2017-01-05 MED ORDER — DOCUSATE SODIUM 100 MG PO CAPS: 100.0000 mg | ORAL_CAPSULE | Freq: Two times a day (BID) | ORAL | 0 refills | Status: DC

## 2017-01-05 MED ORDER — ALUM & MAG HYDROXIDE-SIMETH 200-200-20 MG/5ML PO SUSP: 15.0000 mL | ORAL | Status: DC | PRN

## 2017-01-05 MED ORDER — PHENYLEPHRINE HCL 10 MG/ML IJ SOLN
INTRAMUSCULAR | Status: AC
  Filled 2017-01-05: qty 1

## 2017-01-05 MED ORDER — ONDANSETRON HCL 4 MG/2ML IJ SOLN: 4.0000 mg | Freq: Four times a day (QID) | INTRAMUSCULAR | Status: DC | PRN

## 2017-01-05 MED ORDER — ACETAMINOPHEN 500 MG PO TABS: 1000.0000 mg | ORAL_TABLET | Freq: Three times a day (TID) | ORAL | 0 refills | Status: DC

## 2017-01-05 MED ORDER — OXYCODONE HCL 5 MG PO TABS: 5.0000 mg | ORAL_TABLET | Freq: Once | ORAL | Status: DC | PRN

## 2017-01-05 MED ORDER — PROPOFOL 10 MG/ML IV BOLUS
INTRAVENOUS | Status: AC
  Filled 2017-01-05: qty 40

## 2017-01-05 MED ORDER — FERROUS SULFATE 325 (65 FE) MG PO TABS: 325.0000 mg | ORAL_TABLET | Freq: Three times a day (TID) | ORAL | 3 refills | Status: DC

## 2017-01-05 MED ORDER — CEFAZOLIN SODIUM-DEXTROSE 2-4 GM/100ML-% IV SOLN
INTRAVENOUS | Status: AC
  Filled 2017-01-05: qty 100

## 2017-01-05 MED ORDER — HYDROMORPHONE HCL 2 MG PO TABS
2.0000 mg | ORAL_TABLET | ORAL | Status: DC | PRN
  Filled 2017-01-05: qty 1

## 2017-01-05 MED ORDER — METHOCARBAMOL 500 MG PO TABS
500.0000 mg | ORAL_TABLET | Freq: Four times a day (QID) | ORAL | 0 refills | Status: DC | PRN
Start: 2017-01-05 — End: 2017-02-04

## 2017-01-05 MED ORDER — DEXAMETHASONE SODIUM PHOSPHATE 10 MG/ML IJ SOLN
INTRAMUSCULAR | Status: AC
  Filled 2017-01-05: qty 1

## 2017-01-05 MED ORDER — ASPIRIN 81 MG PO CHEW: 81.0000 mg | CHEWABLE_TABLET | Freq: Two times a day (BID) | ORAL | 0 refills | Status: AC

## 2017-01-05 MED ORDER — HYDROMORPHONE HCL 2 MG PO TABS
4.0000 mg | ORAL_TABLET | ORAL | Status: DC | PRN
  Administered 2017-01-05 – 2017-01-06 (×2): 4 mg via ORAL
  Filled 2017-01-05 (×2): qty 2

## 2017-01-05 MED ORDER — LACTATED RINGERS IV SOLN
INTRAVENOUS | Status: DC
  Administered 2017-01-05: 13:00:00 via INTRAVENOUS
  Administered 2017-01-05: 1000 mL via INTRAVENOUS

## 2017-01-05 MED ORDER — HYDROMORPHONE HCL 1 MG/ML IJ SOLN
0.5000 mg | INTRAMUSCULAR | Status: DC | PRN
  Administered 2017-01-05: 0.5 mg via INTRAVENOUS
  Filled 2017-01-05: qty 1

## 2017-01-05 MED ORDER — CEFAZOLIN SODIUM-DEXTROSE 2-4 GM/100ML-% IV SOLN
2.0000 g | Freq: Four times a day (QID) | INTRAVENOUS | Status: AC
  Administered 2017-01-05 (×2): 2 g via INTRAVENOUS
  Filled 2017-01-05 (×2): qty 100

## 2017-01-05 MED ORDER — FENTANYL CITRATE (PF) 100 MCG/2ML IJ SOLN
INTRAMUSCULAR | Status: DC | PRN
  Administered 2017-01-05: 50 ug via INTRAVENOUS

## 2017-01-05 MED ORDER — ASPIRIN 81 MG PO CHEW
81.0000 mg | CHEWABLE_TABLET | Freq: Two times a day (BID) | ORAL | Status: DC
  Administered 2017-01-05 – 2017-01-06 (×2): 81 mg via ORAL
  Filled 2017-01-05 (×2): qty 1

## 2017-01-05 MED ORDER — TRANEXAMIC ACID 1000 MG/10ML IV SOLN
1000.0000 mg | INTRAVENOUS | Status: AC
  Administered 2017-01-05: 1000 mg via INTRAVENOUS
  Filled 2017-01-05: qty 1100

## 2017-01-05 MED ORDER — METHOCARBAMOL 1000 MG/10ML IJ SOLN
500.0000 mg | Freq: Four times a day (QID) | INTRAVENOUS | Status: DC | PRN
  Administered 2017-01-05: 500 mg via INTRAVENOUS
  Filled 2017-01-05: qty 550

## 2017-01-05 MED ORDER — PHENYLEPHRINE HCL 10 MG/ML IJ SOLN
INTRAVENOUS | Status: DC | PRN
  Administered 2017-01-05: 25 ug/min via INTRAVENOUS

## 2017-01-05 MED ORDER — CELECOXIB 200 MG PO CAPS: 200.0000 mg | ORAL_CAPSULE | Freq: Two times a day (BID) | ORAL | Status: DC

## 2017-01-05 MED ORDER — PROPOFOL 500 MG/50ML IV EMUL
INTRAVENOUS | Status: DC | PRN
  Administered 2017-01-05: 50 ug/kg/min via INTRAVENOUS

## 2017-01-05 MED ORDER — MIDAZOLAM HCL 5 MG/5ML IJ SOLN
INTRAMUSCULAR | Status: DC | PRN
  Administered 2017-01-05: 2 mg via INTRAVENOUS

## 2017-01-05 MED ORDER — SODIUM CHLORIDE 0.9 % IV SOLN
INTRAVENOUS | Status: DC
  Administered 2017-01-05 – 2017-01-06 (×3): via INTRAVENOUS

## 2017-01-05 MED ORDER — ACETAMINOPHEN 500 MG PO TABS
1000.0000 mg | ORAL_TABLET | Freq: Three times a day (TID) | ORAL | Status: DC
  Administered 2017-01-05 – 2017-01-06 (×4): 1000 mg via ORAL
  Filled 2017-01-05 (×4): qty 2

## 2017-01-05 MED ORDER — ZOLPIDEM TARTRATE 5 MG PO TABS: 5.0000 mg | ORAL_TABLET | Freq: Every evening | ORAL | Status: DC | PRN

## 2017-01-05 MED ORDER — SODIUM CHLORIDE 0.9 % IV SOLN
1000.0000 mg | Freq: Once | INTRAVENOUS | Status: AC
  Administered 2017-01-05: 1000 mg via INTRAVENOUS
  Filled 2017-01-05: qty 1100

## 2017-01-05 MED ORDER — METOCLOPRAMIDE HCL 5 MG PO TABS: 5.0000 mg | ORAL_TABLET | Freq: Three times a day (TID) | ORAL | Status: DC | PRN

## 2017-01-05 MED ORDER — HYDROMORPHONE HCL 1 MG/ML IJ SOLN
0.2500 mg | INTRAMUSCULAR | Status: DC | PRN
  Administered 2017-01-05: 0.5 mg via INTRAVENOUS
  Administered 2017-01-05 (×2): 0.25 mg via INTRAVENOUS

## 2017-01-05 MED ORDER — BUPIVACAINE IN DEXTROSE 0.75-8.25 % IT SOLN
INTRATHECAL | Status: DC | PRN
  Administered 2017-01-05: 2 mL via INTRATHECAL

## 2017-01-05 MED ORDER — ONDANSETRON HCL 4 MG/2ML IJ SOLN
INTRAMUSCULAR | Status: AC
  Filled 2017-01-05: qty 2

## 2017-01-05 MED ORDER — POLYETHYLENE GLYCOL 3350 17 G PO PACK: 17.0000 g | PACK | Freq: Two times a day (BID) | ORAL | 0 refills | Status: DC

## 2017-01-05 MED ORDER — SODIUM CHLORIDE 0.9 % IR SOLN
Status: DC | PRN
  Administered 2017-01-05: 1000 mL

## 2017-01-05 MED ORDER — DOCUSATE SODIUM 100 MG PO CAPS
100.0000 mg | ORAL_CAPSULE | Freq: Two times a day (BID) | ORAL | Status: DC
  Administered 2017-01-05 – 2017-01-06 (×2): 100 mg via ORAL
  Filled 2017-01-05 (×2): qty 1

## 2017-01-05 MED ORDER — METOCLOPRAMIDE HCL 5 MG/ML IJ SOLN: 5.0000 mg | Freq: Three times a day (TID) | INTRAMUSCULAR | Status: DC | PRN

## 2017-01-05 MED ORDER — TRAMADOL HCL 50 MG PO TABS
50.0000 mg | ORAL_TABLET | Freq: Four times a day (QID) | ORAL | Status: DC
Start: 2017-01-05 — End: 2017-01-06
  Administered 2017-01-05 (×2): 100 mg via ORAL
  Administered 2017-01-06 (×2): 50 mg via ORAL
  Administered 2017-01-06 (×2): 100 mg via ORAL
  Filled 2017-01-05 (×4): qty 2
  Filled 2017-01-05: qty 1
  Filled 2017-01-05: qty 2

## 2017-01-05 MED ORDER — ONDANSETRON HCL 4 MG/2ML IJ SOLN
INTRAMUSCULAR | Status: DC | PRN
  Administered 2017-01-05: 4 mg via INTRAVENOUS

## 2017-01-05 MED ORDER — OXYCODONE HCL 5 MG/5ML PO SOLN
5.0000 mg | Freq: Once | ORAL | Status: DC | PRN
  Filled 2017-01-05: qty 5

## 2017-01-05 MED ORDER — BISACODYL 10 MG RE SUPP: 10.0000 mg | Freq: Every day | RECTAL | Status: DC | PRN

## 2017-01-05 MED ORDER — DEXAMETHASONE SODIUM PHOSPHATE 10 MG/ML IJ SOLN
10.0000 mg | Freq: Once | INTRAMUSCULAR | Status: AC
  Administered 2017-01-06: 10 mg via INTRAVENOUS
  Filled 2017-01-05: qty 1

## 2017-01-05 MED ORDER — MENTHOL 3 MG MT LOZG: 1.0000 | LOZENGE | OROMUCOSAL | Status: DC | PRN

## 2017-01-05 MED ORDER — PROMETHAZINE HCL 25 MG/ML IJ SOLN: 6.2500 mg | INTRAMUSCULAR | Status: DC | PRN

## 2017-01-05 SURGICAL SUPPLY — 38 items
ADH SKN CLS APL DERMABOND .7 (GAUZE/BANDAGES/DRESSINGS) ×1
BAG DECANTER FOR FLEXI CONT (MISCELLANEOUS) IMPLANT
BAG SPEC THK2 15X12 ZIP CLS (MISCELLANEOUS)
BAG ZIPLOCK 12X15 (MISCELLANEOUS) IMPLANT
BLADE SAG 18X100X1.27 (BLADE) ×2 IMPLANT
CAPT HIP TOTAL 2 ×1 IMPLANT
CLOTH BEACON ORANGE TIMEOUT ST (SAFETY) ×2 IMPLANT
COVER PERINEAL POST (MISCELLANEOUS) ×2 IMPLANT
COVER SURGICAL LIGHT HANDLE (MISCELLANEOUS) ×2 IMPLANT
DERMABOND ADVANCED (GAUZE/BANDAGES/DRESSINGS) ×1
DERMABOND ADVANCED .7 DNX12 (GAUZE/BANDAGES/DRESSINGS) ×1 IMPLANT
DRAPE STERI IOBAN 125X83 (DRAPES) ×2 IMPLANT
DRAPE U-SHAPE 47X51 STRL (DRAPES) ×4 IMPLANT
DRESSING AQUACEL AG SP 3.5X10 (GAUZE/BANDAGES/DRESSINGS) ×1 IMPLANT
DRSG AQUACEL AG ADV 3.5X10 (GAUZE/BANDAGES/DRESSINGS) ×1 IMPLANT
DRSG AQUACEL AG SP 3.5X10 (GAUZE/BANDAGES/DRESSINGS) ×2
DURAPREP 26ML APPLICATOR (WOUND CARE) ×2 IMPLANT
ELECT REM PT RETURN 15FT ADLT (MISCELLANEOUS) ×2 IMPLANT
GLOVE BIOGEL M STRL SZ7.5 (GLOVE) IMPLANT
GLOVE BIOGEL PI IND STRL 7.5 (GLOVE) ×1 IMPLANT
GLOVE BIOGEL PI IND STRL 8.5 (GLOVE) ×1 IMPLANT
GLOVE BIOGEL PI INDICATOR 7.5 (GLOVE) ×5
GLOVE BIOGEL PI INDICATOR 8.5 (GLOVE) ×1
GLOVE ECLIPSE 8.0 STRL XLNG CF (GLOVE) ×6 IMPLANT
GLOVE ORTHO TXT STRL SZ7.5 (GLOVE) ×2 IMPLANT
GOWN STRL REUS W/TWL LRG LVL3 (GOWN DISPOSABLE) ×2 IMPLANT
GOWN STRL REUS W/TWL XL LVL3 (GOWN DISPOSABLE) ×2 IMPLANT
HOLDER FOLEY CATH W/STRAP (MISCELLANEOUS) ×2 IMPLANT
PACK ANTERIOR HIP CUSTOM (KITS) ×2 IMPLANT
SUT MNCRL AB 4-0 PS2 18 (SUTURE) ×2 IMPLANT
SUT STRATAFIX 0 PDS 27 VIOLET (SUTURE) ×2
SUT VIC AB 1 CT1 36 (SUTURE) ×6 IMPLANT
SUT VIC AB 2-0 CT1 27 (SUTURE) ×4
SUT VIC AB 2-0 CT1 TAPERPNT 27 (SUTURE) ×2 IMPLANT
SUTURE STRATFX 0 PDS 27 VIOLET (SUTURE) ×1 IMPLANT
TRAY FOLEY W/METER SILVER 16FR (SET/KITS/TRAYS/PACK) IMPLANT
WATER STERILE IRR 1000ML POUR (IV SOLUTION) ×2 IMPLANT
YANKAUER SUCT BULB TIP 10FT TU (MISCELLANEOUS) IMPLANT

## 2017-01-05 NOTE — Anesthesia Postprocedure Evaluation (Signed)
Anesthesia Post Note  Patient: Benjamin [name]  Procedure(s) Performed: RIGHT TOTAL HIP ARTHROPLASTY ANTERIOR APPROACH (Right Hip)     Patient location during evaluation: PACU Anesthesia Type: Spinal Level of consciousness: oriented and awake and alert Pain management: pain level controlled Vital Signs Assessment: post-procedure vital signs reviewed and stable Respiratory status: spontaneous breathing and respiratory function stable Cardiovascular status: blood pressure returned to baseline and stable Postop Assessment: no headache, no backache and no apparent nausea or vomiting Anesthetic complications: no    Last Vitals:  Vitals:   01/05/17 1245 01/05/17 1310  BP: 130/62 139/72  Pulse: 60 61  Resp: 14 16  Temp: 36.4 C 36.6 C  SpO2: 99% 99%    Last Pain:  Vitals:   01/05/17 1310  TempSrc:   PainSc: 3                  Lynda Rainwater

## 2017-01-05 NOTE — Anesthesia Procedure Notes (Signed)
Date/Time: 01/05/2017 9:53 AM Performed by: Glory Buff, CRNA Oxygen Delivery Method: Simple face mask

## 2017-01-05 NOTE — Interval H&P Note (Signed)
History and Physical Interval Note:  01/05/2017 8:51 AM  Benjamin [name]  has presented today for surgery, with the diagnosis of Right hip osteoarthritis  The various methods of treatment have been discussed with the patient and family. After consideration of risks, benefits and other options for treatment, the patient has consented to  Procedure(s) with comments: RIGHT TOTAL HIP ARTHROPLASTY ANTERIOR APPROACH (Right) - 70 mins as a surgical intervention .  The patient's history has been reviewed, patient examined, no change in status, stable for surgery.  I have reviewed the patient's chart and labs.  Questions were answered to the patient's satisfaction.     Mauri Pole

## 2017-01-05 NOTE — Op Note (Signed)
NAME:  Benjamin [name]                ACCOUNT NO.: [id number]      MEDICAL RECORD NO.: [id number]      FACILITY:  [facility]      PHYSICIAN:  Mauri Pole  DATE OF BIRTH:  August 23, 1940     DATE OF PROCEDURE:  01/05/2017                                 OPERATIVE REPORT         PREOPERATIVE DIAGNOSIS: Right  hip osteoarthritis.      POSTOPERATIVE DIAGNOSIS:  Right hip osteoarthritis.      PROCEDURE:  Right total hip replacement through an anterior approach   utilizing DePuy THR system, component size 33mm pinnacle cup, a size 36+4 neutral   Altrex liner, a size 9 Hi Tri Lock stem with a 36+1.5 delta ceramic   ball.      SURGEON:  Pietro Cassis. Alvan Dame, M.D.      ASSISTANT:  Danae Orleans, PA-C     ANESTHESIA:  Spinal.      SPECIMENS:  None.      COMPLICATIONS:  None.      BLOOD LOSS:  700 cc     DRAINS:  None.      INDICATION OF THE PROCEDURE:  Benjamin [name] is a 76 y.o. male who had   presented to office for evaluation of right hip pain.  Radiographs revealed   progressive degenerative changes with bone-on-bone   articulation to the  hip joint.  The patient had painful limited range of   motion significantly affecting their overall quality of life.  The patient was failing to    respond to conservative measures, and at this point was ready   to proceed with more definitive measures.  The patient has noted progressive   degenerative changes in his hip, progressive problems and dysfunction   with regarding the hip prior to surgery.  Consent was obtained for   benefit of pain relief.  Specific risk of infection, DVT, component   failure, dislocation, need for revision surgery, as well discussion of   the anterior versus posterior approach were reviewed.  Consent was   obtained for benefit of anterior pain relief through an anterior   approach.      PROCEDURE IN DETAIL:  The patient was brought to operative theater.   Once adequate anesthesia, preoperative  antibiotics, 2 gm Ancef, 1 gm of Tranexamic Acid, and 10 mg of Decadron administered.   The patient was positioned supine on the OSI Hanna table.  Once adequate   padding of boney process was carried out, we had predraped out the hip, and  used fluoroscopy to confirm orientation of the pelvis and position.      The right hip was then prepped and draped from proximal iliac crest to   mid thigh with shower curtain technique.      Time-out was performed identifying the patient, planned procedure, and   extremity.     An incision was then made 2 cm distal and lateral to the   anterior superior iliac spine extending over the orientation of the   tensor fascia lata muscle and sharp dissection was carried down to the   fascia of the muscle and protractor placed in the soft tissues.      The fascia was then  incised.  The muscle belly was identified and swept   laterally and retractor placed along the superior neck.  Following   cauterization of the circumflex vessels and removing some pericapsular   fat, a second cobra retractor was placed on the inferior neck.  A third   retractor was placed on the anterior acetabulum after elevating the   anterior rectus.  A L-capsulotomy was along the line of the   superior neck to the trochanteric fossa, then extended proximally and   distally.  Tag sutures were placed and the retractors were then placed   intracapsular.  We then identified the trochanteric fossa and   orientation of my neck cut, confirmed this radiographically   and then made a neck osteotomy with the femur on traction.  The femoral   head was removed without difficulty or complication.  Traction was let   off and retractors were placed posterior and anterior around the   acetabulum.      The labrum and foveal tissue were debrided.  I began reaming with a 42mm   reamer and reamed up to 4mm reamer with good bony bed preparation and a 48mm   cup was chosen.  The final 74mm Pinnacle cup  was then impacted under fluoroscopy  to confirm the depth of penetration and orientation with respect to   abduction.  A screw was placed followed by the hole eliminator.  The final   36+4 neutral Altrex liner was impacted with good visualized rim fit.  The cup was positioned anatomically within the acetabular portion of the pelvis.      At this point, the femur was rolled at 80 degrees.  Further capsule was   released off the inferior aspect of the femoral neck.  I then   released the superior capsule proximally.  The hook was placed laterally   along the femur and elevated manually and held in position with the bed   hook.  The leg was then extended and adducted with the leg rolled to 100   degrees of external rotation.  Once the proximal femur was fully   exposed, I used a box osteotome to set orientation.  I then began   broaching with the starting chili pepper broach and passed this by hand and then broached up to 9.  With the 9 broach in place I chose a high offset neck and did sevral trial reductions.  Based on my trial reductions I did reevaluate his preoperative x-ray as well as his preoperative fluoroscopic imaging to make certain that we were matching his leg lengths to his preoperative position as best as possible.  I recognized with 1 of the reductions that his legs felt like it reduced to easily.  That is when I found that the right leg had been shortened compared to his preoperative position.  During the trial reductions and felt that I was able to best match the position of the right leg compared to the left with the trial components in place and subsequent final components as compared to his preoperative position.  The offset was appropriate, leg lengths   appeared to be best matched to the left hip using the +1.5 head ball confirmed radiographically.   Given these findings, I went ahead and dislocated the hip, repositioned all   retractors and positioned the right hip in the extended  and abducted position.  The final 9 Hi Tri Lock stem was   chosen and it was impacted down to the  level of neck cut.  Based on this   and the trial reduction, a 36+1.5 delta ceramic ball was chosen and   impacted onto a clean and dry trunnion, and the hip was reduced.  The   hip had been irrigated throughout the case again at this point.  I did   reapproximate the superior capsular leaflet to the anterior leaflet   using #1 Vicryl.  The fascia of the   tensor fascia lata muscle was then reapproximated using #1 Vicryl and #0 Stratafix sutures.  The   remaining wound was closed with 2-0 Vicryl and running 4-0 Monocryl.   The hip was cleaned, dried, and dressed sterilely using Dermabond and   Aquacel dressing.  He was then brought   to recovery room in stable condition tolerating the procedure well.    Danae Orleans, PA-C was present for the entirety of the case involved from   preoperative positioning, perioperative retractor management, general   facilitation of the case, as well as primary wound closure as assistant.            Pietro Cassis Alvan Dame, M.D.        01/05/2017 11:23 AM

## 2017-01-05 NOTE — Anesthesia Preprocedure Evaluation (Addendum)
Anesthesia Evaluation  Patient identified by MRN, date of birth, ID band Patient awake    Reviewed: Allergy & Precautions, NPO status , Patient's Chart, lab work & pertinent test results  Airway Mallampati: II  TM Distance: >3 FB Neck ROM: Full    Dental no notable dental hx.    Pulmonary former smoker,    Pulmonary exam normal breath sounds clear to auscultation       Cardiovascular hypertension, negative cardio ROS Normal cardiovascular exam Rhythm:Regular Rate:Normal     Neuro/Psych Anxiety negative neurological ROS     GI/Hepatic Neg liver ROS, GERD  ,  Endo/Other  negative endocrine ROS  Renal/GU negative Renal ROS  negative genitourinary   Musculoskeletal  (+) Arthritis ,   Abdominal   Peds negative pediatric ROS (+)  Hematology negative hematology ROS (+)   Anesthesia Other Findings   Reproductive/Obstetrics negative OB ROS                             Anesthesia Physical  Anesthesia Plan  ASA: II  Anesthesia Plan: Spinal   Post-op Pain Management:    Induction: Intravenous  PONV Risk Score and Plan: 1 and Ondansetron  Airway Management Planned: Simple Face Mask  Additional Equipment:   Intra-op Plan:   Post-operative Plan:   Informed Consent: I have reviewed the patients History and Physical, chart, labs and discussed the procedure including the risks, benefits and alternatives for the proposed anesthesia with the patient or authorized representative who has indicated his/her understanding and acceptance.   Dental advisory given  Plan Discussed with: CRNA  Anesthesia Plan Comments:         Anesthesia Quick Evaluation

## 2017-01-05 NOTE — Progress Notes (Signed)
PT Cancellation Note  Patient Details Name: Benjamin [name] MRN: [id number] DOB: October 25, 1940   Cancelled Treatment:    Reason Eval/Treat Not Completed: Pain limiting ability to participate, not ready to get up.    Claretha Cooper 01/05/2017, 4:47 PM Tresa Endo PT [phone]

## 2017-01-05 NOTE — Discharge Instructions (Signed)

## 2017-01-05 NOTE — Transfer of Care (Signed)
Immediate Anesthesia Transfer of Care Note  Patient: Benjamin [name]  Procedure(s) Performed: RIGHT TOTAL HIP ARTHROPLASTY ANTERIOR APPROACH (Right Hip)  Patient Location: PACU  Anesthesia Type:Spinal  Level of Consciousness: awake, sedated and responds to stimulation  Airway & Oxygen Therapy: Patient Spontanous Breathing and Patient connected to face mask oxygen  Post-op Assessment: Report given to RN and Post -op Vital signs reviewed and stable  Post vital signs: Reviewed and stable  Last Vitals:  Vitals:   01/05/17 0743 01/05/17 1153  BP: (!) 151/67 (!) 124/57  Pulse: 75 62  Resp: 18 13  Temp: 36.6 C 36.4 C  SpO2: 95% 97%    Last Pain:  Vitals:   01/05/17 0743  TempSrc: Oral      Patients Stated Pain Goal: 4 (69/67/89 3810)  Complications: No apparent anesthesia complications

## 2017-01-05 NOTE — Anesthesia Procedure Notes (Signed)
Spinal  Patient location during procedure: OR Start time: 01/05/2017 9:57 AM End time: 01/05/2017 10:01 AM Staffing Anesthesiologist: Lynda Rainwater, MD Resident/CRNA: Glory Buff, CRNA Performed: resident/CRNA  Preanesthetic Checklist Completed: patient identified, site marked, surgical consent, pre-op evaluation, timeout performed, IV checked, risks and benefits discussed and monitors and equipment checked Spinal Block Patient position: sitting Prep: ChloraPrep Patient monitoring: heart rate, continuous pulse ox and blood pressure Approach: midline Location: L3-4 Injection technique: single-shot Needle Needle type: Pencan  Needle gauge: 24 G Needle length: 9 cm Needle insertion depth: 6 cm Assessment Sensory level: T6 Additional Notes Kit expiration date checked and verified.  -heme, -paraesthesia, +CSF pre and post injection, patient tolerated well.

## 2017-01-06 LAB — BASIC METABOLIC PANEL
Anion gap: 5 (ref 5–15)
BUN: 13 mg/dL (ref 6–20)
CHLORIDE: 105 mmol/L (ref 101–111)
CO2: 26 mmol/L (ref 22–32)
Calcium: 8.4 mg/dL — ABNORMAL LOW (ref 8.9–10.3)
Creatinine, Ser: 0.73 mg/dL (ref 0.61–1.24)
GFR calc Af Amer: >60 mL/min (ref >60)
Glucose, Bld: 136 mg/dL — ABNORMAL HIGH (ref 65–99)
POTASSIUM: 4.1 mmol/L (ref 3.5–5.1)
SODIUM: 136 mmol/L (ref 135–145)

## 2017-01-06 LAB — CBC
HEMATOCRIT: 34.4 % — AB (ref 39.0–52.0)
Hemoglobin: 11.9 g/dL — ABNORMAL LOW (ref 13.0–17.0)
MCH: 31.4 pg (ref 26.0–34.0)
MCHC: 34.6 g/dL (ref 30.0–36.0)
MCV: 90.8 fL (ref 78.0–100.0)
Platelets: 125 10*3/uL — ABNORMAL LOW (ref 150–400)
RBC: 3.79 MIL/uL — AB (ref 4.22–5.81)
RDW: 12.9 % (ref 11.5–15.5)
WBC: 6.9 10*3/uL (ref 4.0–10.5)

## 2017-01-06 MED ORDER — HYDROMORPHONE HCL 2 MG PO TABS: 2.0000 mg | ORAL_TABLET | ORAL | 0 refills | Status: DC | PRN

## 2017-01-06 MED ORDER — ALPRAZOLAM 1 MG PO TABS
2.0000 mg | ORAL_TABLET | Freq: Two times a day (BID) | ORAL | Status: DC | PRN
  Administered 2017-01-06 (×2): 1 mg via ORAL
  Filled 2017-01-06 (×2): qty 2

## 2017-01-06 NOTE — Evaluation (Signed)
Occupational Therapy Evaluation Patient Details Name: Benjamin [name] MRN: [id number] DOB: 1940/12/23 Today's Date: 01/06/2017    History of Present Illness s/p R DA THA, h/o L TKA in '16   Clinical Impression   This 76 year old man was admitted for the above sx. All education was completed. No further OT is needed at this time    Follow Up Recommendations  No OT follow up    Equipment Recommendations  None recommended by OT    Recommendations for Other Services       Precautions / Restrictions Precautions Precautions: Fall Restrictions Weight Bearing Restrictions: No      Mobility Bed Mobility Overal bed mobility: Modified Independent             General bed mobility comments: HOB raised; his does this at home  Transfers Overall transfer level: Needs assistance Equipment used: Rolling walker (2 wheeled) Transfers: Sit to/from Stand Sit to Stand: Min guard         General transfer comment: for safety and cues for UE placement    Balance                                           ADL either performed or assessed with clinical judgement   ADL Overall ADL's : Needs assistance/impaired Eating/Feeding: Independent   Grooming: Supervision/safety;Standing   Upper Body Bathing: Set up;Sitting   Lower Body Bathing: Supervison/ safety;Sit to/from stand;With adaptive equipment   Upper Body Dressing : Set up;Sitting   Lower Body Dressing: Minimal assistance;Sit to/from stand;With adaptive equipment   Toilet Transfer: Min guard;Ambulation;BSC;RW   Toileting- Water quality scientist and Hygiene: Min guard;Sit to/from stand         General ADL Comments: ambulated to bathroom. Reviewed shower transfer and pt verbalizes understanding.  He has all AE and didn't feel he needed to review any of this.     Vision         Perception     Praxis      Pertinent Vitals/Pain Pain Assessment: 0-10 Pain Score: 2  Pain Location: R hip Pain  Descriptors / Indicators: Sore Pain Intervention(s): Limited activity within patient's tolerance;Monitored during session;Premedicated before session;Repositioned(removed ice)     Hand Dominance     Extremity/Trunk Assessment Upper Extremity Assessment Upper Extremity Assessment: Overall WFL for tasks assessed           Communication Communication Communication: No difficulties   Cognition Arousal/Alertness: Awake/alert Behavior During Therapy: WFL for tasks assessed/performed Overall Cognitive Status: Within Functional Limits for tasks assessed                                     General Comments       Exercises     Shoulder Instructions      Home Living Family/patient expects to be discharged to:: Private residence Living Arrangements: Spouse/significant other   Type of Home: House             Bathroom Shower/Tub: Hospital doctor Toilet: Handicapped height         Additional Comments: wife is bedridden and has 24/7 caregivers. Pt needs to be mostly independent; can have caregivers help with teds. He has many grab bars and loops to pull up on.  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        OT Problem List:        OT Treatment/Interventions:      OT Goals(Current goals can be found in the care plan section) Acute Rehab OT Goals Patient Stated Goal: return to PLOF OT Goal Formulation: All assessment and education complete, DC therapy  OT Frequency:     Barriers to D/C:            Co-evaluation              AM-PAC PT "6 Clicks" Daily Activity     Outcome Measure Help from another person eating meals?: None Help from another person taking care of personal grooming?: A Little Help from another person toileting, which includes using toliet, bedpan, or  urinal?: A Little Help from another person bathing (including washing, rinsing, drying)?: A Little Help from another person to put on and taking off regular upper body clothing?: A Little Help from another person to put on and taking off regular lower body clothing?: A Little 6 Click Score: 19   End of Session    Activity Tolerance: Patient tolerated treatment well Patient left: in bed;with call bell/phone within reach;with bed alarm set  OT Visit Diagnosis: Pain Pain - Right/Left: Right Pain - part of body: Hip                Time: 1610-9604 OT Time Calculation (min): 22 min Charges:  OT General Charges $OT Visit: 1 Visit OT Evaluation $OT Eval Low Complexity: 1 Low G-Codes:     [place], OTR/L [phone] 01/06/2017  Benjamin [name] 01/06/2017, 8:54 AM

## 2017-01-06 NOTE — Progress Notes (Signed)
     Subjective: 1 Day Post-Op Procedure(s) (LRB): RIGHT TOTAL HIP ARTHROPLASTY ANTERIOR APPROACH (Right)   Patient reports pain as mild, pain controlled. No events throughout the night. Dr. Alvan Dame discussed the case and plan.  Ready to be discharged home.   Objective:   VITALS:   Vitals:   01/06/17 0024 01/06/17 0454  BP: (!) 146/63 114/63  Pulse: 65 (!) 58  Resp: 16 15  Temp: (!) 97.5 F (36.4 C) (!) 97.4 F (36.3 C)  SpO2: 97% 94%    Dorsiflexion/Plantar flexion intact Incision: dressing C/D/I No cellulitis present Compartment soft  LABS Recent Labs    01/06/17 0520  HGB 11.9*  HCT 34.4*  WBC 6.9  PLT 125*    Recent Labs    01/06/17 0520  NA 136  K 4.1  BUN 13  CREATININE 0.73  GLUCOSE 136*     Assessment/Plan: 1 Day Post-Op Procedure(s) (LRB): RIGHT TOTAL HIP ARTHROPLASTY ANTERIOR APPROACH (Right) Foley cath d/c'ed Up with therapy Discharge home Follow up in 2 weeks at [facility]. Follow up with OLIN,Alvin Diffee D in 2 weeks.  Contact information:  [facility] [address] [place] [phone]    Obese (BMI 30-39.9) Estimated body mass index is 32.96 kg/m as calculated from the following:   Height as of this encounter: 6' (1.829 m).   Weight as of this encounter: 110.2 kg (243 lb). Patient also counseled that weight may inhibit the healing process Patient counseled that losing weight will help with future health issues         [place]. Emmalene Kattner   PAC  01/06/2017, 7:57 AM

## 2017-01-06 NOTE — Progress Notes (Signed)
Discharge planning, no HH needs identified. Plan for no PT, has DME. [phone] 

## 2017-01-06 NOTE — Evaluation (Signed)
Physical Therapy One Time Evaluation Patient Details Name: Benjamin [name] MRN: [id number] DOB: 29-May-1940 Today's Date: 01/06/2017   History of Present Illness  Pt is a 76 year old male s/p R DA THA, h/o L TKA in '16  Clinical Impression  Patient evaluated by Physical Therapy with no further acute PT needs identified. All education has been completed and the patient has no further questions.  Pt ambulated in hallway and reports having ramp and chair lift at home.  Pt also educated on and performed LE exercises.  Pt feels ready for d/c home today.  See below for any follow-up Physical Therapy or equipment needs. PT is signing off. Thank you for this referral.     Follow Up Recommendations No PT follow up;DC plan and follow up therapy as arranged by surgeon    Equipment Recommendations  None recommended by PT    Recommendations for Other Services       Precautions / Restrictions Precautions Precautions: Fall      Mobility  Bed Mobility Overal bed mobility: Needs Assistance Bed Mobility: Supine to Sit;Sit to Supine     Supine to sit: Min assist Sit to supine: Min assist   General bed mobility comments: requested assist for R LE for pain control  Transfers Overall transfer level: Needs assistance Equipment used: Rolling walker (2 wheeled) Transfers: Sit to/from Stand Sit to Stand: Min guard         General transfer comment: verbal cues for UE and LE positioning  Ambulation/Gait Ambulation/Gait assistance: Min guard Ambulation Distance (Feet): 320 Feet Assistive device: Rolling walker (2 wheeled) Gait Pattern/deviations: Step-through pattern;Decreased stance time - right;Antalgic     General Gait Details: verbal cues for sequence, RW positioning, posture, step length  Stairs            Wheelchair Mobility    Modified Rankin (Stroke Patients Only)       Balance                                             Pertinent Vitals/Pain Pain  Assessment: 0-10 Pain Score: 4  Pain Location: R hip Pain Descriptors / Indicators: Sore;Aching Pain Intervention(s): Repositioned;Limited activity within patient's tolerance;Monitored during session    Home Living Family/patient expects to be discharged to:: Private residence Living Arrangements: Spouse/significant other   Type of Home: House Home Access: Ramped entrance     Home Layout: Multi-level   Additional Comments: wife is bedridden and has 24/7 caregivers. Pt needs to be mostly independent; can have caregivers help with teds. He has many grab bars and loops to pull up on.  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     Hand Dominance        Extremity/Trunk Assessment        Lower Extremity Assessment Lower Extremity Assessment: RLE deficits/detail RLE Deficits / Details: anticipated post op hip weakness       Communication   Communication: No difficulties  Cognition Arousal/Alertness: Awake/alert Behavior During Therapy: WFL for tasks assessed/performed Overall Cognitive Status: Within Functional Limits for tasks assessed  General Comments      Exercises Total Joint Exercises Ankle Circles/Pumps: AROM;Standing;10 reps;Both(heel raises) Hip ABduction/ADduction: AROM;Right;Standing;10 reps Straight Leg Raises: AROM;Right;Standing;10 reps Knee Flexion: AROM;10 reps;Right;Standing Marching in Standing: AROM;Right;Seated;10 reps Standing Hip Extension: AROM;10 reps;Right;Standing   Assessment/Plan    PT Assessment Patent does not need any further PT services  PT Problem List         PT Treatment Interventions      PT Goals (Current goals can be found in the Care Plan section)  Acute Rehab PT Goals PT Goal Formulation: All assessment and education  complete, DC therapy    Frequency     Barriers to discharge        Co-evaluation               AM-PAC PT "6 Clicks" Daily Activity  Outcome Measure Difficulty turning over in bed (including adjusting bedclothes, sheets and blankets)?: None Difficulty moving from lying on back to sitting on the side of the bed? : Unable Difficulty sitting down on and standing up from a chair with arms (e.g., wheelchair, bedside commode, etc,.)?: Unable Help needed moving to and from a bed to chair (including a wheelchair)?: A Little Help needed walking in hospital room?: A Little Help needed climbing 3-5 steps with a railing? : A Little 6 Click Score: 15    End of Session Equipment Utilized During Treatment: Gait belt Activity Tolerance: Patient tolerated treatment well Patient left: in bed;with call bell/phone within reach Nurse Communication: Mobility status PT Visit Diagnosis: Other abnormalities of gait and mobility (R26.89)    Time: 3536-1443 PT Time Calculation (min) (ACUTE ONLY): 26 min   Charges:   PT Evaluation $PT Eval Low Complexity: 1 Low PT Treatments $Therapeutic Exercise: 8-22 mins   PT G Codes:       Carmelia Bake, PT, DPT 01/06/2017 Pager: [phone]  York Ram E 01/06/2017, 12:17 PM

## 2017-01-11 NOTE — Discharge Summary (Signed)
Physician Discharge Summary  Patient ID: Benjamin [name] MRN: [id number] DOB/AGE: Sep 23, 1940 76 y.o.  Admit date: 01/05/2017 Discharge date: 01/06/2017   Procedures:  Procedure(s) (LRB): RIGHT TOTAL HIP ARTHROPLASTY ANTERIOR APPROACH (Right)  Attending Physician:  Dr. Paralee Cancel   Admission Diagnoses:   Right hip primary OA / pain  Discharge Diagnoses:  Principal Problem:   S/P right THA, AA Active Problems:   Obese   S/P hip replacement  Past Medical History:  Diagnosis Date  . Allergy   . Anxiety   . Arthritis   . BPH (benign prostatic hyperplasia)    with elevated PSA, sees Dr. Diona Fanti, benign  . Elevated PSA   . GERD (gastroesophageal reflux disease)   . Hyperlipidemia   . Insomnia   . Platelets decreased ([place])   . Pneumonia    walking PNA  . Pre-diabetes    No meds    HPI:    Benjamin [name], 76 y.o. male, has a history of pain and functional disability in the right hip(s) due to arthritis and patient has failed non-surgical conservative treatments for greater than 12 weeks to include NSAID's and/or analgesics and activity modification.  Onset of symptoms was gradual starting 2+ years ago with gradually worsening course since that time.The patient noted no past surgery on the right hip(s).  Patient currently rates pain in the right hip at 6 out of 10 with activity. Patient has worsening of pain with activity and weight bearing, trendelenberg gait, pain that interfers with activities of daily living and pain with passive range of motion. Patient has evidence of periarticular osteophytes and joint space narrowing by imaging studies. This condition presents safety issues increasing the risk of falls.  There is no current active infection.   Risks, benefits and expectations were discussed with the patient.  Risks including but not limited to the risk of anesthesia, blood clots, nerve damage, blood vessel damage, failure of the prosthesis, infection and up to and including  death.  Patient understand the risks, benefits and expectations and wishes to proceed with surgery.   PCP: Laurey Morale, MD   Discharged Condition: good  Hospital Course:  Patient underwent the above stated procedure on 01/05/2017. Patient tolerated the procedure well and brought to the recovery room in good condition and subsequently to the floor.  POD #1 BP: 114/63 ; Pulse: 58 ; Temp: 97.4 F (36.3 C) ; Resp: 15 Patient reports pain as mild, pain controlled. No events throughout the night. Dr. Alvan Dame discussed the case and plan.  Ready to be discharged home. Dorsiflexion/plantar flexion intact, incision: dressing C/D/I, no cellulitis present and compartment soft.   LABS  Basename    HGB     11.9  HCT     34.4    Discharge Exam: General appearance: alert, cooperative and no distress Extremities: Homans sign is negative, no sign of DVT, no edema, redness or tenderness in the calves or thighs and no ulcers, gangrene or trophic changes  Disposition: Home with follow up in 2 weeks   Follow-up Information    Paralee Cancel, MD. Schedule an appointment as soon as possible for a visit in 2 week(s).   Specialty:  Orthopedic Surgery Contact information: [address] [place] [address] [phone]           Discharge Instructions    Call MD / Call 911   Complete by:  As directed    If you experience chest pain or shortness of breath, CALL 911  and be transported to the hospital emergency room.  If you develope a fever above 101 F, pus (white drainage) or increased drainage or redness at the wound, or calf pain, call your surgeon's office.   Change dressing   Complete by:  As directed    Maintain surgical dressing until follow up in the clinic. If the edges start to pull up, may reinforce with tape. If the dressing is no longer working, may remove and cover with gauze and tape, but must keep the area dry and clean.  Call with any questions or concerns.    Constipation Prevention   Complete by:  As directed    Drink plenty of fluids.  Prune juice may be helpful.  You may use a stool softener, such as Colace (over the counter) 100 mg twice a day.  Use MiraLax (over the counter) for constipation as needed.   Diet - low sodium heart healthy   Complete by:  As directed    Discharge instructions   Complete by:  As directed    Maintain surgical dressing until follow up in the clinic. If the edges start to pull up, may reinforce with tape. If the dressing is no longer working, may remove and cover with gauze and tape, but must keep the area dry and clean.  Follow up in 2 weeks at [facility]. Call with any questions or concerns.   Increase activity slowly as tolerated   Complete by:  As directed    Weight bearing as tolerated with assist device (walker, cane, etc) as directed, use it as long as suggested by your surgeon or therapist, typically at least 4-6 weeks.   TED hose   Complete by:  As directed    Use stockings (TED hose) for 2 weeks on both leg(s).  You may remove them at night for sleeping.      Allergies as of 01/06/2017      Reactions   Codeine    hallucinations   Diclofenac    Easy bruising and GI bleeding    Norco [hydrocodone-acetaminophen]    Hallucinations      Medication List    STOP taking these medications   ibuprofen 200 MG tablet Commonly known as:  ADVIL,MOTRIN   traMADol 50 MG tablet Commonly known as:  ULTRAM     TAKE these medications   acetaminophen 500 MG tablet Commonly known as:  TYLENOL Take 2 tablets (1,000 mg total) by mouth every 8 (eight) hours.   alprazolam 2 MG tablet Commonly known as:  XANAX Take 1 tablet (2 mg total) by mouth every 6 (six) hours as needed. for anxiety What changed:    when to take this  reasons to take this  additional instructions   amLODipine 10 MG tablet Commonly known as:  NORVASC Take 1 tablet (10 mg total) by mouth daily.   aspirin 81 MG chewable  tablet Commonly known as:  ASPIRIN CHILDRENS Chew 1 tablet (81 mg total) by mouth 2 (two) times daily.   docusate sodium 100 MG capsule Commonly known as:  COLACE Take 1 capsule (100 mg total) by mouth 2 (two) times daily.   ferrous sulfate 325 (65 FE) MG tablet Commonly known as:  FERROUSUL Take 1 tablet (325 mg total) by mouth 3 (three) times daily with meals.   HYDROmorphone 2 MG tablet Commonly known as:  DILAUDID Take 1-2 tablets (2-4 mg total) by mouth every 4 (four) hours as needed for moderate pain.   lisinopril 10  MG tablet Commonly known as:  PRINIVIL,ZESTRIL Take 1 tablet (10 mg total) by mouth daily.   methocarbamol 500 MG tablet Commonly known as:  ROBAXIN Take 1 tablet (500 mg total) by mouth every 6 (six) hours as needed for muscle spasms.   omeprazole 40 MG capsule Commonly known as:  PRILOSEC TAKE ONE CAPSULE BY MOUTH 2 TIMES A DAY What changed:    how much to take  how to take this  when to take this  additional instructions   polyethylene glycol packet Commonly known as:  MIRALAX / GLYCOLAX Take 17 g by mouth 2 (two) times daily.   zolpidem 10 MG tablet Commonly known as:  AMBIEN Take 1 tablet (10 mg total) by mouth at bedtime as needed for sleep.     ASK your doctor about these medications   traMADol 50 MG tablet Commonly known as:  ULTRAM Take 1-2 tablets (50-100 mg total) by mouth every 6 (six) hours as needed for up to 5 days. Ask about: Should I take this medication?            Discharge Care Instructions  (From admission, onward)        Start     Ordered   01/06/17 0000  Change dressing    Comments:  Maintain surgical dressing until follow up in the clinic. If the edges start to pull up, may reinforce with tape. If the dressing is no longer working, may remove and cover with gauze and tape, but must keep the area dry and clean.  Call with any questions or concerns.   01/06/17 0800       Signed: [place] P[place]. Tico Crotteau    PA-C  01/11/2017, 12:42 PM

## 2017-01-25 DIAGNOSIS — M1711 Unilateral primary osteoarthritis, right knee: Secondary | ICD-10-CM | POA: Diagnosis not present

## 2017-01-25 DIAGNOSIS — Z96641 Presence of right artificial hip joint: Secondary | ICD-10-CM | POA: Diagnosis not present

## 2017-02-03 ENCOUNTER — Ambulatory Visit: Payer: Medicare Other | Admitting: Family Medicine

## 2017-02-03 NOTE — Telephone Encounter (Signed)
This encounter was created in error - please disregard.

## 2017-02-04 ENCOUNTER — Encounter: Payer: Self-pay | Admitting: Family Medicine

## 2017-02-04 ENCOUNTER — Ambulatory Visit: Payer: Medicare Other | Admitting: Family Medicine

## 2017-02-04 VITALS — BP 100/56 | HR 71 | Temp 97.4°F | Ht 72.0 in | Wt 221.9 lb

## 2017-02-04 DIAGNOSIS — R634 Abnormal weight loss: Secondary | ICD-10-CM

## 2017-02-04 DIAGNOSIS — E119 Type 2 diabetes mellitus without complications: Secondary | ICD-10-CM | POA: Diagnosis not present

## 2017-02-04 DIAGNOSIS — I10 Essential (primary) hypertension: Secondary | ICD-10-CM

## 2017-02-04 LAB — COMPREHENSIVE METABOLIC PANEL
ALBUMIN: 4.4 g/dL (ref 3.5–5.2)
ALK PHOS: 96 U/L (ref 39–117)
ALT: 18 U/L (ref 0–53)
AST: 14 U/L (ref 0–37)
BUN: 21 mg/dL (ref 6–23)
CALCIUM: 9.4 mg/dL (ref 8.4–10.5)
CHLORIDE: 105 meq/L (ref 96–112)
CO2: 25 mEq/L (ref 19–32)
CREATININE: 1.08 mg/dL (ref 0.40–1.50)
GFR: 70.66 mL/min (ref >60.00)
Glucose, Bld: 107 mg/dL — ABNORMAL HIGH (ref 70–99)
Potassium: 4.5 mEq/L (ref 3.5–5.1)
Sodium: 138 mEq/L (ref 135–145)
TOTAL PROTEIN: 6.5 g/dL (ref 6.0–8.3)
Total Bilirubin: 0.6 mg/dL (ref 0.2–1.2)

## 2017-02-04 LAB — CBC WITH DIFFERENTIAL/PLATELET
Basophils Absolute: 0 10*3/uL (ref 0.0–0.1)
Basophils Relative: 0.8 % (ref 0.0–3.0)
EOS ABS: 0.2 10*3/uL (ref 0.0–0.7)
EOS PCT: 3.9 % (ref 0.0–5.0)
HEMATOCRIT: 38.8 % — AB (ref 39.0–52.0)
HEMOGLOBIN: 12.9 g/dL — AB (ref 13.0–17.0)
LYMPHS PCT: 24.6 % (ref 12.0–46.0)
Lymphs Abs: 1.1 10*3/uL (ref 0.7–4.0)
MCHC: 33.3 g/dL (ref 30.0–36.0)
MCV: 91.3 fl (ref 78.0–100.0)
Monocytes Absolute: 0.5 10*3/uL (ref 0.1–1.0)
Monocytes Relative: 11 % (ref 3.0–12.0)
Neutro Abs: 2.7 10*3/uL (ref 1.4–7.7)
Neutrophils Relative %: 59.7 % (ref 43.0–77.0)
Platelets: 166 10*3/uL (ref 150.0–400.0)
RBC: 4.25 Mil/uL (ref 4.22–5.81)
RDW: 13.9 % (ref 11.5–15.5)
WBC: 4.5 10*3/uL (ref 4.0–10.5)

## 2017-02-04 LAB — TSH: TSH: 1.89 u[IU]/mL (ref 0.35–4.50)

## 2017-02-04 LAB — SEDIMENTATION RATE: SED RATE: 5 mm/h (ref 0–20)

## 2017-02-04 NOTE — Progress Notes (Signed)
Subjective:     Patient ID: Benjamin [name], male   DOB: May 10, 1940, 76 y.o.   MRN: [id number]  HPI Patient seen with reported 20 pound weight loss over the past month or so. He just had hip replacement surgery November 28. He weighed 243 pounds here on 11/21. Since that time he has made some significant dietary changes. Most notably, he has gone from drinking 3-4 drinks of vodka per day to none. He's also restricted calories and is also eating less often. 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  Denies depression symptoms.  His chronic problems include hypertension, GERD, type 2 diabetes (which has been controlled without medication). He had recent A1c 5.4%. He has had slight diminished appetite but overall appetite fairly stable. He has chronically elevated PSA which was stable last spring. Colonoscopy June 2002 which was normal. He is an ex-smoker. Denies any headache, cough, dysphagia, sore throat, adenopathy, abdominal pain, or any change in urine or stool habits. No abdominal pain. Recent hemoglobin following surgery 11.9  Past Medical History:  Diagnosis Date  . Allergy   . Anxiety   . Arthritis   . BPH (benign prostatic hyperplasia)    with elevated PSA, sees Dr. Diona Fanti, benign  . Elevated PSA   . GERD (gastroesophageal reflux disease)   . Hyperlipidemia   . Insomnia   . Platelets decreased ([place])   . Pneumonia    walking PNA  . Pre-diabetes    No meds   Past Surgical History:  Procedure Laterality Date  . CHOLECYSTECTOMY N/A 01/10/2014   Procedure: LAPAROSCOPIC CHOLECYSTECTOMY WITH INTRAOPERATIVE CHOLANGIOGRAM;  Surgeon: Pedro Earls, MD;  Location: [facility];  Service: General;  Laterality: N/A;  . COLONOSCOPY  07/11/2015   per Dr. Carlean Purl, adenomatous polyps, repeat in 3 yrs   . JOINT REPLACEMENT     Right hip arthroplasty Dr. Alvan Dame 01/05/17   . left knee arthroscopy      . LIPOSUCTION  1987  . prostate biopsies in 2003 and again 05/2008     per Dr. Diona Fanti. benign  . REFRACTIVE SURGERY    . repair  of large umblicial hernia     per Dr. Kaylyn Lim on 01/10/10  . right knee arthroscopy    . TONSILLECTOMY    . 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 TOTAL KNEE ARTHROPLASTY Left 07/16/2014   Procedure: LEFT TOTAL KNEE ARTHROPLASTY;  Surgeon: Paralee Cancel, MD;  Location: [facility];  Service: Orthopedics;  Laterality: Left;    reports that he quit smoking about 43 years ago. His smoking use included cigarettes. He smoked 1.50 packs per day. he has never used smokeless tobacco. He reports that he drinks about 4.2 oz of alcohol per week. He reports that he uses drugs. Drug: Marijuana. family history includes Alzheimer's disease in his other. Allergies  Allergen Reactions  . Codeine     hallucinations  . Diclofenac     Easy bruising and GI bleeding   . Norco [Hydrocodone-Acetaminophen]     Hallucinations      Review of Systems  Constitutional: Positive for appetite change and unexpected weight change. Negative for chills and fever.  HENT: Negative for trouble swallowing.   Respiratory:  Negative for cough and shortness of breath.   Cardiovascular: Negative for chest pain.  Gastrointestinal: Negative for abdominal distention and abdominal pain.  Endocrine: Negative for polydipsia and polyuria.  Genitourinary: Negative for dysuria.  Hematological: Negative for adenopathy.       Objective:   Physical Exam  Constitutional: He is oriented to person, place, and time. He appears well-developed and well-nourished.  HENT:  Right Ear: External ear normal.  Left Ear: External ear normal.  Mouth/Throat: Oropharynx is clear and moist. No oropharyngeal exudate.  Eyes: Pupils are equal, round, and  reactive to light.  Neck: Neck supple. No thyromegaly present.  Cardiovascular: Normal rate and regular rhythm.  Pulmonary/Chest: Effort normal and breath sounds normal. No respiratory distress. He has no wheezes. He has no rales.  Abdominal: Soft. He exhibits no mass. There is no tenderness. There is no rebound and no guarding.  Musculoskeletal: He exhibits no edema.  Neurological: He is alert and oriented to person, place, and time.       Assessment:     Weight loss. He has made some substantial changes recently which I think most likely accounts for this. These include less fast food, overall calorie reduction, and illumination or at least extreme reduction in alcohol intake    Plan:     -check further labs with CBC, comprehensive metabolic panel, TSH, sedimentation rate -We've advised follow-up with his primary in one month for weight recheck  Eulas Post MD [facility]

## 2017-02-04 NOTE — Patient Instructions (Signed)
Follow up with Dr Sarajane Jews in one month for re-check and sooner as needed.

## 2017-02-05 DIAGNOSIS — M1711 Unilateral primary osteoarthritis, right knee: Secondary | ICD-10-CM | POA: Diagnosis not present

## 2017-02-05 DIAGNOSIS — F411 Generalized anxiety disorder: Secondary | ICD-10-CM | POA: Diagnosis not present

## 2017-02-05 DIAGNOSIS — I10 Essential (primary) hypertension: Secondary | ICD-10-CM | POA: Diagnosis not present

## 2017-02-05 DIAGNOSIS — M96840 Postprocedural hematoma of a musculoskeletal structure following a musculoskeletal system procedure: Secondary | ICD-10-CM | POA: Diagnosis not present

## 2017-02-05 DIAGNOSIS — G4700 Insomnia, unspecified: Secondary | ICD-10-CM | POA: Diagnosis not present

## 2017-02-05 DIAGNOSIS — Z7982 Long term (current) use of aspirin: Secondary | ICD-10-CM | POA: Diagnosis not present

## 2017-02-05 DIAGNOSIS — Z96641 Presence of right artificial hip joint: Secondary | ICD-10-CM | POA: Diagnosis not present

## 2017-03-02 ENCOUNTER — Telehealth: Payer: Self-pay | Admitting: Family Medicine

## 2017-03-02 NOTE — Telephone Encounter (Signed)
Copied from [place]. Topic: General - Other >> Mar 02, 2017  1:37 PM Carolyn Stare wrote:  Pt said he is currently taking AMBIEN but would like to switch to [facility]  pt would like a call back    [facility]

## 2017-03-03 ENCOUNTER — Telehealth: Payer: Self-pay | Admitting: Family Medicine

## 2017-03-03 NOTE — Telephone Encounter (Signed)
Another message has been created on this issues OK to close out TE.   Pt wanted to switch from ambein to [place]   And sent PCP the refill request for the xanax as well pt is going out of town this weekend and will be completely out

## 2017-03-03 NOTE — Telephone Encounter (Signed)
Pt. Is requesting a refill on his Xanax to go to [facility].

## 2017-03-03 NOTE — Telephone Encounter (Signed)
Sent to PCP for the OK to switch    Pt stated that with the Ambein he will keep waking up after 4 hours of sleep and with the lunesta he could go to sleep peacefully all night.   Plus he needs a refill on his Xanax pt is going out of town this Friday and he is completely out.   Please send medications to [facility].   Xanax was last refilled 08/19/2016 disp 120 with 5 refills   Last Ov with Dr. Sarajane Jews 12/2016

## 2017-03-03 NOTE — Telephone Encounter (Signed)
Copied from [place]. Topic: Quick Communication - Rx Refill/Question >> Mar 03, 2017 10:44 AM Burnis Medin, NT wrote: Medication: zolpidem (AMBIEN) 10 MG tablet   Has the patient contacted their pharmacy? Yes    (Agent: If no, request that the patient contact the pharmacy for the refill.)   Preferred Pharmacy (with phone number or street name): [facility], [place]. [phone] (Phone) [phone] (Fax)     Agent: Please be advised that RX refills may take up to 3 business days. We ask that you follow-up with your pharmacy.

## 2017-03-04 MED ORDER — ALPRAZOLAM 2 MG PO TABS: 2.0000 mg | ORAL_TABLET | Freq: Four times a day (QID) | ORAL | 5 refills | Status: DC | PRN

## 2017-03-04 MED ORDER — ESZOPICLONE 3 MG PO TABS: 3.0000 mg | ORAL_TABLET | Freq: Every day | ORAL | 5 refills | Status: DC

## 2017-03-04 NOTE — Telephone Encounter (Signed)
Called pt. Pt advised Rx's have been sent into pharmacy.

## 2017-03-04 NOTE — Telephone Encounter (Signed)
Call in Xanax #120 with 5 rf, also Lunesta 3 mg to take qhs, #30 with 5 rf

## 2017-03-05 ENCOUNTER — Encounter: Payer: Self-pay | Admitting: Family Medicine

## 2017-03-05 ENCOUNTER — Ambulatory Visit (INDEPENDENT_AMBULATORY_CARE_PROVIDER_SITE_OTHER): Payer: Medicare Other | Admitting: Family Medicine

## 2017-03-05 VITALS — BP 124/66 | HR 78 | Temp 98.2°F | Wt 218.2 lb

## 2017-03-05 DIAGNOSIS — R972 Elevated prostate specific antigen [PSA]: Secondary | ICD-10-CM

## 2017-03-05 DIAGNOSIS — F411 Generalized anxiety disorder: Secondary | ICD-10-CM | POA: Diagnosis not present

## 2017-03-05 DIAGNOSIS — I10 Essential (primary) hypertension: Secondary | ICD-10-CM

## 2017-03-05 DIAGNOSIS — R634 Abnormal weight loss: Secondary | ICD-10-CM

## 2017-03-05 NOTE — Telephone Encounter (Signed)
I had ordered CT scans of chest, abdomen, and pelvis and said to wait until Feb 25. Please set these up for either Jan 31 or Feb 1. Thanks

## 2017-03-05 NOTE — Progress Notes (Signed)
   Subjective:    Patient ID: Benjamin [name], male    DOB: 10/27/1940, 77 y.o.   MRN: [id number]  HPI Here with concerns about ongoing weight loss. He was seen here last month and had some lab work done, but no explanation was found. Of course has had elevated PSA counts for years. He last saw Dr. Vernie Shanks in 2013 and he basically told him he was not too worried about prostate cancer because he had 2 biopsies, in 2009 and again in 2010, that were negative. His last PSA here in March 2018 was 14.08. He remains under enormous stress due to his wife's end stage disease. 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 He is still working at the age of 16 and he is not sure how long he can keep this up.    Review of Systems  Constitutional: Positive for unexpected weight change. Negative for activity change and appetite change.  Respiratory: Negative.   Cardiovascular: Negative.   Gastrointestinal: Negative.   Genitourinary: Positive for frequency. Negative for dysuria, flank pain, hematuria and urgency.  Neurological: Negative.   Psychiatric/Behavioral: Positive for dysphoric mood and sleep disturbance. The patient is nervous/anxious.        Objective:   Physical Exam  Constitutional: He is oriented to person, place, and time. He appears well-developed and well-nourished.  Cardiovascular: Normal rate, regular rhythm, normal heart sounds and intact distal pulses.  Pulmonary/Chest: Effort normal and breath sounds normal. No respiratory distress. He has no wheezes. He has no rales.  Abdominal: Soft. Bowel sounds are normal. He exhibits no distension. There is no tenderness. There is no rebound and no guarding.  Neurological: He is alert and oriented to person, place, and time.  Psychiatric: His behavior is normal. Thought content normal.  Depressed affect           Assessment & Plan:  He is dealing with a lot of stress and  he gets some relief with Xanax. He continues to lose weight and I am concerned he may have a disease process going on in addition to the stress. We will set up CT cans of the chest, abdomen, and pelvis. I will arrange for him to see Urology again ASAP.  Alysia Penna, MD

## 2017-03-06 ENCOUNTER — Encounter: Payer: Self-pay | Admitting: Family Medicine

## 2017-03-10 ENCOUNTER — Other Ambulatory Visit: Payer: Self-pay | Admitting: Family Medicine

## 2017-03-11 ENCOUNTER — Telehealth: Payer: Self-pay

## 2017-03-11 NOTE — Telephone Encounter (Signed)
Sent to [facility] pt will need a PA. Please call for PA and let them know when approved.  Call [phone]

## 2017-03-15 ENCOUNTER — Other Ambulatory Visit: Payer: Medicare Other

## 2017-03-15 ENCOUNTER — Inpatient Hospital Stay: Admission: RE | Admit: 2017-03-15 | Payer: Medicare Other | Source: Ambulatory Visit

## 2017-04-05 ENCOUNTER — Other Ambulatory Visit (INDEPENDENT_AMBULATORY_CARE_PROVIDER_SITE_OTHER): Payer: Medicare Other

## 2017-04-05 ENCOUNTER — Ambulatory Visit (INDEPENDENT_AMBULATORY_CARE_PROVIDER_SITE_OTHER)
Admission: RE | Admit: 2017-04-05 | Discharge: 2017-04-05 | Disposition: A | Discharge status: Home / Self Care | Payer: Medicare Other | Source: Ambulatory Visit | Attending: Family Medicine | Admitting: Family Medicine

## 2017-04-05 ENCOUNTER — Telehealth: Payer: Self-pay | Admitting: Family Medicine

## 2017-04-05 DIAGNOSIS — R972 Elevated prostate specific antigen [PSA]: Secondary | ICD-10-CM

## 2017-04-05 DIAGNOSIS — I10 Essential (primary) hypertension: Secondary | ICD-10-CM | POA: Diagnosis not present

## 2017-04-05 DIAGNOSIS — R634 Abnormal weight loss: Secondary | ICD-10-CM

## 2017-04-05 DIAGNOSIS — K4090 Unilateral inguinal hernia, without obstruction or gangrene, not specified as recurrent: Secondary | ICD-10-CM | POA: Diagnosis not present

## 2017-04-05 DIAGNOSIS — R911 Solitary pulmonary nodule: Secondary | ICD-10-CM | POA: Diagnosis not present

## 2017-04-05 LAB — BASIC METABOLIC PANEL
BUN: 14 mg/dL (ref 6–23)
CO2: 28 mEq/L (ref 19–32)
Calcium: 9.6 mg/dL (ref 8.4–10.5)
Chloride: 103 mEq/L (ref 96–112)
Creatinine, Ser: 0.92 mg/dL (ref 0.40–1.50)
GFR: 84.99 mL/min (ref >60.00)
GLUCOSE: 117 mg/dL — AB (ref 70–99)
POTASSIUM: 4 meq/L (ref 3.5–5.1)
Sodium: 139 mEq/L (ref 135–145)

## 2017-04-05 MED ORDER — IOPAMIDOL (ISOVUE-300) INJECTION 61%
100.0000 mL | Freq: Once | INTRAVENOUS | Status: AC | PRN
  Administered 2017-04-05: 100 mL via INTRAVENOUS

## 2017-04-05 NOTE — Telephone Encounter (Signed)
I ordered a BMET

## 2017-04-05 NOTE — Telephone Encounter (Signed)
Stacey in [place] called, we need to order a stat bun/creatinine, pt having scan done today, on his way to [facility] for labs now.

## 2017-04-16 DIAGNOSIS — Z96652 Presence of left artificial knee joint: Secondary | ICD-10-CM | POA: Diagnosis not present

## 2017-04-16 DIAGNOSIS — Z471 Aftercare following joint replacement surgery: Secondary | ICD-10-CM | POA: Diagnosis not present

## 2017-04-16 DIAGNOSIS — Z96641 Presence of right artificial hip joint: Secondary | ICD-10-CM | POA: Diagnosis not present

## 2017-08-23 ENCOUNTER — Other Ambulatory Visit: Payer: Self-pay | Admitting: Family Medicine

## 2017-09-03 DIAGNOSIS — M1611 Unilateral primary osteoarthritis, right hip: Secondary | ICD-10-CM | POA: Diagnosis not present

## 2017-09-03 DIAGNOSIS — M5416 Radiculopathy, lumbar region: Secondary | ICD-10-CM | POA: Diagnosis not present

## 2017-09-03 DIAGNOSIS — Z471 Aftercare following joint replacement surgery: Secondary | ICD-10-CM | POA: Diagnosis not present

## 2017-09-03 DIAGNOSIS — Z96641 Presence of right artificial hip joint: Secondary | ICD-10-CM | POA: Diagnosis not present

## 2017-09-05 ENCOUNTER — Other Ambulatory Visit: Payer: Self-pay | Admitting: Family Medicine

## 2017-09-06 NOTE — Telephone Encounter (Signed)
Last OV 03/05/2017   Last refilled 03/04/2017 disp 120 with 5 refills   Sent to PCP for approval

## 2017-09-07 NOTE — Telephone Encounter (Signed)
Call in #120 with 5 rf 

## 2017-10-31 ENCOUNTER — Other Ambulatory Visit: Payer: Self-pay | Admitting: Family Medicine

## 2017-11-02 NOTE — Telephone Encounter (Signed)
Dr. Fry please advise of refill. Thanks 

## 2017-11-12 ENCOUNTER — Encounter: Payer: Self-pay | Admitting: Family Medicine

## 2017-11-12 ENCOUNTER — Ambulatory Visit (INDEPENDENT_AMBULATORY_CARE_PROVIDER_SITE_OTHER): Payer: Medicare Other | Admitting: Family Medicine

## 2017-11-12 VITALS — BP 128/72 | HR 70 | Temp 97.9°F | Ht 71.75 in | Wt 225.5 lb

## 2017-11-12 DIAGNOSIS — F418 Other specified anxiety disorders: Secondary | ICD-10-CM

## 2017-11-12 DIAGNOSIS — G4700 Insomnia, unspecified: Secondary | ICD-10-CM | POA: Diagnosis not present

## 2017-11-12 DIAGNOSIS — E1065 Type 1 diabetes mellitus with hyperglycemia: Secondary | ICD-10-CM | POA: Diagnosis not present

## 2017-11-12 DIAGNOSIS — Z23 Encounter for immunization: Secondary | ICD-10-CM

## 2017-11-12 DIAGNOSIS — I10 Essential (primary) hypertension: Secondary | ICD-10-CM

## 2017-11-12 DIAGNOSIS — E785 Hyperlipidemia, unspecified: Secondary | ICD-10-CM

## 2017-11-12 DIAGNOSIS — R972 Elevated prostate specific antigen [PSA]: Secondary | ICD-10-CM | POA: Diagnosis not present

## 2017-11-12 DIAGNOSIS — K219 Gastro-esophageal reflux disease without esophagitis: Secondary | ICD-10-CM

## 2017-11-12 DIAGNOSIS — E119 Type 2 diabetes mellitus without complications: Secondary | ICD-10-CM

## 2017-11-12 LAB — LIPID PANEL
CHOLESTEROL: 249 mg/dL — AB (ref 0–200)
HDL: 60.2 mg/dL (ref >39.00)
LDL Cholesterol: 172 mg/dL — ABNORMAL HIGH (ref 0–99)
NonHDL: 188.34
Total CHOL/HDL Ratio: 4
Triglycerides: 81 mg/dL (ref 0.0–149.0)
VLDL: 16.2 mg/dL (ref 0.0–40.0)

## 2017-11-12 LAB — CBC WITH DIFFERENTIAL/PLATELET
BASOS PCT: 0.9 % (ref 0.0–3.0)
Basophils Absolute: 0 10*3/uL (ref 0.0–0.1)
EOS PCT: 2.2 % (ref 0.0–5.0)
Eosinophils Absolute: 0.1 10*3/uL (ref 0.0–0.7)
HCT: 46.2 % (ref 39.0–52.0)
Hemoglobin: 15.5 g/dL (ref 13.0–17.0)
Lymphocytes Relative: 30.7 % (ref 12.0–46.0)
Lymphs Abs: 1.3 10*3/uL (ref 0.7–4.0)
MCHC: 33.4 g/dL (ref 30.0–36.0)
MCV: 94 fl (ref 78.0–100.0)
MONO ABS: 0.4 10*3/uL (ref 0.1–1.0)
Monocytes Relative: 10.2 % (ref 3.0–12.0)
NEUTROS ABS: 2.4 10*3/uL (ref 1.4–7.7)
NEUTROS PCT: 56 % (ref 43.0–77.0)
PLATELETS: 138 10*3/uL — AB (ref 150.0–400.0)
RBC: 4.92 Mil/uL (ref 4.22–5.81)
RDW: 13.5 % (ref 11.5–15.5)
WBC: 4.4 10*3/uL (ref 4.0–10.5)

## 2017-11-12 LAB — HEMOGLOBIN A1C: HEMOGLOBIN A1C: 5 % (ref 4.6–6.5)

## 2017-11-12 LAB — POC URINALSYSI DIPSTICK (AUTOMATED)
Bilirubin, UA: NEGATIVE
Blood, UA: NEGATIVE
GLUCOSE UA: NEGATIVE
Ketones, UA: NEGATIVE
Leukocytes, UA: NEGATIVE
Nitrite, UA: NEGATIVE
Protein, UA: NEGATIVE
SPEC GRAV UA: 1.025 (ref 1.010–1.025)
Urobilinogen, UA: 0.2 E.U./dL
pH, UA: 6 (ref 5.0–8.0)

## 2017-11-12 LAB — PSA: PSA: 17.92 ng/mL — ABNORMAL HIGH (ref 0.10–4.00)

## 2017-11-12 LAB — BASIC METABOLIC PANEL
BUN: 14 mg/dL (ref 6–23)
CALCIUM: 9.5 mg/dL (ref 8.4–10.5)
CO2: 30 mEq/L (ref 19–32)
Chloride: 106 mEq/L (ref 96–112)
Creatinine, Ser: 1.04 mg/dL (ref 0.40–1.50)
GFR: 73.66 mL/min (ref >60.00)
Glucose, Bld: 112 mg/dL — ABNORMAL HIGH (ref 70–99)
Potassium: 4.4 mEq/L (ref 3.5–5.1)
SODIUM: 142 meq/L (ref 135–145)

## 2017-11-12 LAB — HEPATIC FUNCTION PANEL
ALBUMIN: 4.5 g/dL (ref 3.5–5.2)
ALT: 17 U/L (ref 0–53)
AST: 16 U/L (ref 0–37)
Alkaline Phosphatase: 58 U/L (ref 39–117)
BILIRUBIN DIRECT: 0.1 mg/dL (ref 0.0–0.3)
TOTAL PROTEIN: 6.7 g/dL (ref 6.0–8.3)
Total Bilirubin: 0.8 mg/dL (ref 0.2–1.2)

## 2017-11-12 LAB — TSH: TSH: 1.77 u[IU]/mL (ref 0.35–4.50)

## 2017-11-12 NOTE — Progress Notes (Signed)
   Subjective:    Patient ID: Benjamin [name], male    DOB: Nov 19, 1940, 77 y.o.   MRN: [id number]  HPI Here to follow up on issues. He feels well physically but he struggles with anxiety and insomnia, also a little depression. His wife is an invalid and it takes all his time to care for her and to work on his job. During the day while is working he has aides to watch her, then he takes care of her at night. He worries about her health and about their finances all the time. He has trouble sleeping. He gets some relief with Xanax. We have tried numerous other agents but they all gave him side effects. He gets almost no exercise but he tried to watch his diet. His BP has been stable.    Review of Systems  Constitutional: Positive for fatigue.  HENT: Negative.   Eyes: Negative.   Respiratory: Negative.   Cardiovascular: Negative.   Gastrointestinal: Negative.   Genitourinary: Negative.   Musculoskeletal: Negative.   Skin: Negative.   Neurological: Negative.   Psychiatric/Behavioral: Positive for dysphoric mood and sleep disturbance. Negative for agitation, confusion, hallucinations, self-injury and suicidal ideas. The patient is nervous/anxious.        Objective:   Physical Exam  Constitutional: He is oriented to person, place, and time. He appears well-developed and well-nourished. No distress.  HENT:  Head: Normocephalic and atraumatic.  Right Ear: External ear normal.  Left Ear: External ear normal.  Nose: Nose normal.  Mouth/Throat: Oropharynx is clear and moist. No oropharyngeal exudate.  Eyes: Pupils are equal, round, and reactive to light. Conjunctivae and EOM are normal. Right eye exhibits no discharge. Left eye exhibits no discharge. No scleral icterus.  Neck: Neck supple. No JVD present. No tracheal deviation present. No thyromegaly present.  Cardiovascular: Normal rate, regular rhythm, normal heart sounds and intact distal pulses. Exam reveals no gallop and no friction rub.  No  murmur heard. Pulmonary/Chest: Effort normal and breath sounds normal. No respiratory distress. He has no wheezes. He has no rales. He exhibits no tenderness.  Abdominal: Soft. Bowel sounds are normal. He exhibits no distension and no mass. There is no tenderness. There is no rebound and no guarding.  Genitourinary: Rectum normal, prostate normal and penis normal. Rectal exam shows guaiac negative stool. No penile tenderness.  Musculoskeletal: Normal range of motion. He exhibits no edema or tenderness.  Lymphadenopathy:    He has no cervical adenopathy.  Neurological: He is alert and oriented to person, place, and time. He has normal reflexes. He displays normal reflexes. No cranial nerve deficit. He exhibits normal muscle tone. Coordination normal.  Skin: Skin is warm and dry. No rash noted. He is not diaphoretic. No erythema. No pallor.  Psychiatric: He has a normal mood and affect. His behavior is normal. Judgment and thought content normal.          Assessment & Plan:  He is doing well physically. His HTN is stable. We will get fasting labs today to check lipids, A1c, etc. He will continue to use Xanax for anxiety and sleep.  Alysia Penna, MD

## 2017-11-17 ENCOUNTER — Encounter: Payer: Self-pay | Admitting: *Deleted

## 2018-01-17 DIAGNOSIS — M545 Low back pain: Secondary | ICD-10-CM | POA: Diagnosis not present

## 2018-01-17 DIAGNOSIS — M25551 Pain in right hip: Secondary | ICD-10-CM | POA: Diagnosis not present

## 2018-01-17 DIAGNOSIS — M1611 Unilateral primary osteoarthritis, right hip: Secondary | ICD-10-CM | POA: Diagnosis not present

## 2018-01-25 ENCOUNTER — Ambulatory Visit: Payer: Self-pay

## 2018-01-25 NOTE — Telephone Encounter (Signed)
Attempted to call pt. to discuss his request to change from Eszopiclone to Ambien.  Left voice message that will send his request to Dr. Sarajane Jews.  Noted in progress note of 10/4, he has well documented insomnia.  (did not Triage this pt.)      Message from [facility] sent at 01/25/2018 10:27 AM EST   Pt stated that the Eszopiclone 3 MG TABS is not working and he would like to change the prescription to Ambien. Pt would like the prescription sent to [place], [place] Pt requests call back. Cb# [phone]

## 2018-01-25 NOTE — Telephone Encounter (Signed)
Dr. Sarajane Jews please advise on change in sleep meds. Thanks

## 2018-01-26 ENCOUNTER — Ambulatory Visit: Payer: Self-pay

## 2018-01-26 NOTE — Telephone Encounter (Signed)
Please advise 

## 2018-01-26 NOTE — Telephone Encounter (Signed)
Patient called and says Benjamin [name] is not available at the pharmacy. He says a new prescription of Ambien will need to be sent. He says Ambien worked for him in the past. He says being that he is caring for his wife and after being awakened at nights sometimes to help her, he does not go back to sleep or find it difficult to go back to sleep. Stated that the Ambien would be a better choice because it helped him years ago. He is trying to get this Rx sent to pharmacy today if possible. Please advise. Ph# [phone]   Reason for Disposition . Caller requesting a NON-URGENT new prescription or refill and triager unable to refill per unit policy  Protocols used: MEDICATION QUESTION CALL-A-AH

## 2018-01-27 ENCOUNTER — Telehealth: Payer: Self-pay | Admitting: Family Medicine

## 2018-01-27 MED ORDER — ZOLPIDEM TARTRATE 10 MG PO TABS: 10.0000 mg | ORAL_TABLET | Freq: Every evening | ORAL | 5 refills | Status: DC | PRN

## 2018-01-27 NOTE — Telephone Encounter (Signed)
Copied from [id number]. Topic: General - Other >> Jan 27, 2018  8:37 AM Leward Quan A wrote: Reason for CRM:  Patient called back requesting a call from Dr Sarajane Jews assistant. Also scheduled an appointment to come in and see Dr Sarajane Jews on 01/28/18 but request a call back today please. Stated that there is amix up or misunderstanding over one of his medications. Ph# [phone]

## 2018-01-27 NOTE — Telephone Encounter (Signed)
See my answer to his other call about this

## 2018-01-27 NOTE — Telephone Encounter (Signed)
Patient called and he says the problem is taken care of. He wanted to discuss Lunesta and says Ambien was sent in and that's what he wanted.

## 2018-01-27 NOTE — Telephone Encounter (Signed)
Called and spoke with pt and he is aware of rx sent to the pharmacy.

## 2018-01-27 NOTE — Addendum Note (Signed)
Addended by: Elie Confer on: 01/27/2018 09:00 AM   Modules accepted: Orders

## 2018-01-27 NOTE — Telephone Encounter (Signed)
Called pt and left voicemail to call back.

## 2018-01-27 NOTE — Telephone Encounter (Signed)
Call in Zolpidem 10 mg to use qhs, #30 with 5 rf

## 2018-01-28 ENCOUNTER — Ambulatory Visit: Payer: Medicare Other | Admitting: Family Medicine

## 2018-01-30 ENCOUNTER — Other Ambulatory Visit: Payer: Self-pay | Admitting: Family Medicine

## 2018-02-03 ENCOUNTER — Ambulatory Visit: Payer: Medicare Other | Admitting: Family Medicine

## 2018-02-03 ENCOUNTER — Encounter: Payer: Self-pay | Admitting: Family Medicine

## 2018-02-03 VITALS — BP 132/70 | HR 70 | Temp 98.0°F | Wt 222.4 lb

## 2018-02-03 DIAGNOSIS — I10 Essential (primary) hypertension: Secondary | ICD-10-CM

## 2018-02-03 DIAGNOSIS — G4700 Insomnia, unspecified: Secondary | ICD-10-CM | POA: Diagnosis not present

## 2018-02-03 DIAGNOSIS — F418 Other specified anxiety disorders: Secondary | ICD-10-CM | POA: Diagnosis not present

## 2018-02-03 MED ORDER — TRAZODONE HCL 100 MG PO TABS: 50.0000 mg | ORAL_TABLET | Freq: Every day | ORAL | 0 refills | Status: DC

## 2018-02-03 NOTE — Progress Notes (Signed)
   Subjective:    Patient ID: Benjamin [name], male    DOB: 1940-04-21, 77 y.o.   MRN: [id number]  HPI Here to follow up on HTN and for advice about medications. Normally his BP at home is stable but a few days ago while he was dealing with a stressful job situation his BP jumped up to 205/110. This came down over a period of several hours when he was able to relax a bit. Also he continues to struggle with sleeping. His invalid wife has severe insomnia and this adds to his own problems. He has tried Ambien, Lunesta, and Temazepam with poor results. He has tried a few of his wife's Trazodone tablets and he has discovered that if he takes one of these along with a Tylenol PM he can sleep fairly well. He asks if this would be okay for him to use every day. He also uses Xanax during the day for anxiety.    Review of Systems  Constitutional: Negative.   Respiratory: Negative.   Cardiovascular: Negative.   Neurological: Negative.   Psychiatric/Behavioral: Positive for dysphoric mood and sleep disturbance. Negative for agitation, confusion and decreased concentration. The patient is nervous/anxious.        Objective:   Physical Exam Constitutional:      Appearance: Normal appearance.  Cardiovascular:     Rate and Rhythm: Normal rate and regular rhythm.     Pulses: Normal pulses.     Heart sounds: Normal heart sounds.  Pulmonary:     Effort: Pulmonary effort is normal.     Breath sounds: Normal breath sounds.  Neurological:     General: No focal deficit present.     Mental Status: He is alert and oriented to person, place, and time.  Psychiatric:        Mood and Affect: Mood normal.        Thought Content: Thought content normal.        Judgment: Judgment normal.           Assessment & Plan:  His HTN is stable. His anxiety is stable. For insomnia I told that he can continue to combine Trazodone 50 mg and Tylenol PM. He will use his wife's supply of trazodone for now. Alysia Penna,  MD

## 2018-02-08 ENCOUNTER — Ambulatory Visit: Payer: Self-pay

## 2018-02-08 NOTE — Telephone Encounter (Signed)
Pt called to say he has had a sore throat for about 2 days.  He states that his wife's care taker called today to say she was positive for the flu. He was trying to get Tamaflu for himself. Pt states he has no fever and rates his throat an mildly sore. He stays that the left side gland is slightly swollen. Pt was advised that the office is closed and he would need to be seen at an urgent care. Pt states that he prefers to not go to urgent care. Appointment was made at pt request for Friday Jan 3rd. Care advice read to patient. Pt verbalized understanding and is aware that the next time office is open is Thursday Jan 2.  Reason for Disposition . [1] Sore throat with cough/cold symptoms AND [2] present > 5 days  Answer Assessment - Initial Assessment Questions 1. ONSET: "When did the throat start hurting?" (Hours or days ago)      Back of throat 2. SEVERITY: "How bad is the sore throat?" (Scale 1-10; mild, moderate or severe)   - MILD (1-3):  doesn't interfere with eating or normal activities   - MODERATE (4-7): interferes with eating some solids and normal activities   - SEVERE (8-10):  excruciating pain, interferes with most normal activities   - SEVERE DYSPHAGIA: can't swallow liquids, drooling     mild 3. STREP EXPOSURE: "Has there been any exposure to strep within the past week?" If so, ask: "What type of contact occurred?"      no 4.  VIRAL SYMPTOMS: "Are there any symptoms of a cold, such as a runny nose, cough, hoarse voice or red eyes?"      Hoarse with feeling of lump in throat 5. FEVER: "Do you have a fever?" If so, ask: "What is your temperature, how was it measured, and when did it start?"     no 6. PUS ON THE TONSILS: "Is there pus on the tonsils in the back of your throat?"     no 7. OTHER SYMPTOMS: "Do you have any other symptoms?" (e.g., difficulty breathing, headache, rash)     no 8. PREGNANCY: "Is there any chance you are pregnant?" "When was your last menstrual period?"  N/A  Protocols used: SORE THROAT-A-AH

## 2018-02-08 NOTE — Telephone Encounter (Signed)
Called pt and left message. Pt with exposure to the Flu, wanting to know what precautions to take.

## 2018-02-10 MED ORDER — OSELTAMIVIR PHOSPHATE 75 MG PO CAPS: 75.0000 mg | ORAL_CAPSULE | Freq: Two times a day (BID) | ORAL | 0 refills | Status: DC

## 2018-02-10 NOTE — Addendum Note (Signed)
Addended by: Elie Confer on: 02/10/2018 01:31 PM   Modules accepted: Orders

## 2018-02-10 NOTE — Telephone Encounter (Signed)
There is no need for an OV unless he wants to see [place].

## 2018-02-10 NOTE — Telephone Encounter (Signed)
Dr. Sarajane Jews please advise if you would like to see the pt on Friday or send something in for him.  Thanks

## 2018-02-10 NOTE — Telephone Encounter (Signed)
Call in Tamiflu 75 mg to take BID for 5 days

## 2018-02-10 NOTE — Telephone Encounter (Signed)
Called and spoke with pt and he is aware of tamiflu that has been sent to the pharmacy.  Nothing further is needed.

## 2018-02-11 ENCOUNTER — Ambulatory Visit: Payer: Medicare Other | Admitting: Family Medicine

## 2018-02-18 ENCOUNTER — Encounter: Payer: Self-pay | Admitting: Physician Assistant

## 2018-02-18 ENCOUNTER — Ambulatory Visit: Payer: Medicare Other | Admitting: Physician Assistant

## 2018-02-18 VITALS — BP 126/70 | HR 80 | Temp 98.5°F | Ht 71.75 in | Wt 231.0 lb

## 2018-02-18 DIAGNOSIS — R05 Cough: Secondary | ICD-10-CM | POA: Diagnosis not present

## 2018-02-18 DIAGNOSIS — R6889 Other general symptoms and signs: Secondary | ICD-10-CM

## 2018-02-18 DIAGNOSIS — R059 Cough, unspecified: Secondary | ICD-10-CM

## 2018-02-18 LAB — POC INFLUENZA A&B (BINAX/QUICKVUE)
Influenza A, POC: NEGATIVE
Influenza B, POC: NEGATIVE

## 2018-02-18 MED ORDER — METHYLPREDNISOLONE ACETATE 80 MG/ML IJ SUSP
80.0000 mg | Freq: Once | INTRAMUSCULAR | Status: AC
  Administered 2018-02-18: 80 mg via INTRAMUSCULAR

## 2018-02-18 MED ORDER — AMOXICILLIN-POT CLAVULANATE 875-125 MG PO TABS: 1.0000 | ORAL_TABLET | Freq: Two times a day (BID) | ORAL | 0 refills | Status: DC

## 2018-02-18 NOTE — Progress Notes (Signed)
Benjamin [name] is a 78 y.o. male here for a new problem.  I acted as a Education administrator for [employer], PA-C Anselmo Pickler, LPN  History of Present Illness:   Chief Complaint  Patient presents with  . Sore Throat    Sore Throat   This is a new problem. Episode onset: Started about  2weeks ago. The problem has been waxing and waning. Sore throat worse side: Right > Left. There has been no fever. The pain is at a severity of 2/10. The pain is mild. Associated symptoms include congestion, coughing, a hoarse voice, shortness of breath and trouble swallowing. Pertinent negatives include no abdominal pain, diarrhea, ear discharge, ear pain, headaches, plugged ear sensation, neck pain or vomiting. Associated symptoms comments: Chest congestion. Exposure to: Flu 2 weeks ago. He has tried acetaminophen for the symptoms.    His symptoms were overall stable until yesterday he felt a cough come on. His wife is immunocompromised and he wants to make sure he isn't sick.  Denies: chest pain    Past Medical History:  Diagnosis Date  . Allergy   . Anxiety   . Arthritis   . BPH (benign prostatic hyperplasia)    with elevated PSA, sees Dr. Diona Fanti, benign  . Elevated PSA   . GERD (gastroesophageal reflux disease)   . Hyperlipidemia   . Insomnia   . Platelets decreased ([place])   . Pneumonia    walking PNA  . Pre-diabetes    No meds     Social History   Socioeconomic History  . Marital status: Married    Spouse name: Not on file  . Number of children: Not on file  . Years of education: Not on file  . Highest education level: Not on file  Occupational History  . Not on file  Social Needs  . Financial resource strain: Not on file  . Food insecurity:    Worry: Not on file    Inability: Not on file  . Transportation needs:    Medical: Not on file    Non-medical: Not on file  Tobacco Use  . Smoking status: Former Smoker    Packs/day: 1.50    Types: Cigarettes    Last attempt to quit:  02/09/1974    Years since quitting: 44.0  . Smokeless tobacco: Never Used  . Tobacco comment: quit over 38 yo, absolutely smoked more than 100 cig; referred for [employer]   Substance and Sexual Activity  . Alcohol use: Yes    Alcohol/week: 7.0 standard drinks    Types: 7 Shots of liquor per week    Comment: daily  . Drug use: Yes    Types: Marijuana    Comment: occas uses 1 time every other month - this past weekend  . Sexual activity: Not Currently  Lifestyle  . Physical activity:    Days per week: Not on file    Minutes per session: Not on file  . Stress: Not on file  Relationships  . Social connections:    Talks on phone: Not on file    Gets together: Not on file    Attends religious service: Not on file    Active member of club or organization: Not on file    Attends meetings of clubs or organizations: Not on file    Relationship status: Not on file  . Intimate partner violence:    Fear of current or ex partner: Not on file    Emotionally abused: Not on file  Physically abused: Not on file    Forced sexual activity: Not on file  Other Topics Concern  . 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 CHOLECYSTECTOMY N/A 01/10/2014   Procedure: LAPAROSCOPIC CHOLECYSTECTOMY WITH INTRAOPERATIVE CHOLANGIOGRAM;  Surgeon: Pedro Earls, MD;  Location: [facility];  Service: General;  Laterality: N/A;  . COLONOSCOPY  07/11/2015   per Dr. Carlean Purl, adenomatous polyps, repeat in 3 yrs   . JOINT REPLACEMENT     Right hip arthroplasty Dr. Alvan Dame 01/05/17   . left knee arthroscopy     . LIPOSUCTION  1987  . prostate biopsies in 2003 and again 05/2008     per Dr. Diona Fanti. benign  . REFRACTIVE SURGERY    . repair  of large umblicial hernia     per Dr. Kaylyn Lim on 01/10/10  . right knee arthroscopy    . TONSILLECTOMY    .  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 TOTAL KNEE ARTHROPLASTY Left 07/16/2014   Procedure: LEFT TOTAL KNEE ARTHROPLASTY;  Surgeon: Paralee Cancel, MD;  Location: [facility];  Service: Orthopedics;  Laterality: Left;    Family History  Problem Relation Age of Onset  . Alzheimer's disease Other   . Colon cancer Neg Hx   . Esophageal cancer Neg Hx   . Pancreatic cancer Neg Hx   . Prostate cancer Neg Hx   . Rectal cancer Neg Hx   . Stomach cancer Neg Hx     Allergies  Allergen Reactions  . Codeine     hallucinations  . Diclofenac     Easy bruising and GI bleeding   . Norco [Hydrocodone-Acetaminophen]     Hallucinations     Current Medications:   Current Outpatient Medications:  .  alprazolam (XANAX) 2 MG tablet, TAKE 1 TABLET EVERY 6 HOURS AS NEEDED FOR ANXIETY., Disp: 120 tablet, Rfl: 5 .  amLODipine (NORVASC) 10 MG tablet, TAKE 1 TABLET BY MOUTH EVERY DAY, Disp: 90 tablet, Rfl: 3 .  lisinopril (PRINIVIL,ZESTRIL) 10 MG tablet, TAKE 1 TABLET BY MOUTH EVERY DAY, Disp: 90 tablet, Rfl: 3 .  naproxen sodium (ALEVE) 220 MG tablet, Take 220 mg by mouth., Disp: , Rfl:  .  omeprazole (PRILOSEC) 40 MG capsule, TAKE (1) CAPSULE TWICE DAILY., Disp: 180 capsule, Rfl: 2 .  polyethylene glycol (MIRALAX / GLYCOLAX) packet, Take 17 g by mouth 2 (two) times daily., Disp: 14 each, Rfl: 0 .  traMADol (ULTRAM) 50 MG tablet, Take 100 mg by mouth 2 (two) times daily., Disp: , Rfl:  .  traZODone (DESYREL) 100 MG tablet, Take 0.5 tablets (50 mg total) by mouth at bedtime., Disp: 1 tablet, Rfl: 0 .  amoxicillin-clavulanate (AUGMENTIN) 875-125 MG tablet, Take 1 tablet by mouth 2 (two) times daily., Disp: 20 tablet, Rfl: 0 .  oseltamivir (TAMIFLU) 75 MG capsule, Take 1 capsule (75 mg total) by mouth 2 (two) times daily. (Patient not taking: Reported on  02/18/2018), Disp: 10 capsule, Rfl: 0   Review of Systems:   Review of Systems  HENT: Positive for congestion, hoarse voice and trouble swallowing. Negative for ear discharge and ear  pain.   Respiratory: Positive for cough and shortness of breath.   Gastrointestinal: Negative for abdominal pain, diarrhea and vomiting.  Musculoskeletal: Negative for neck pain.  Neurological: Negative for headaches.    Vitals:   Vitals:   02/18/18 1353  BP: 126/70  Pulse: 80  Temp: 98.5 F (36.9 C)  TempSrc: Oral  SpO2: 97%  Weight: 231 lb (104.8 kg)  Height: 5' 11.75" (1.822 m)     Body mass index is 31.55 kg/m.  Physical Exam:   Physical Exam Vitals signs and nursing note reviewed.  Constitutional:      General: He is not in acute distress.    Appearance: He is well-developed. He is not ill-appearing or toxic-appearing.  HENT:     Head: Normocephalic and atraumatic.     Right Ear: Tympanic membrane, ear canal and external ear normal. Tympanic membrane is not erythematous, retracted or bulging.     Left Ear: Tympanic membrane, ear canal and external ear normal. Tympanic membrane is not erythematous, retracted or bulging.     Nose: Nose normal.     Right Sinus: No maxillary sinus tenderness or frontal sinus tenderness.     Left Sinus: No maxillary sinus tenderness or frontal sinus tenderness.     Mouth/Throat:     Lips: Pink.     Mouth: Mucous membranes are moist.     Pharynx: Uvula midline. Posterior oropharyngeal erythema present.     Tonsils: Swelling: 0 on the right. 0 on the left.  Eyes:     General: Lids are normal.     Conjunctiva/sclera: Conjunctivae normal.  Neck:     Trachea: Trachea normal.  Cardiovascular:     Rate and Rhythm: Normal rate and regular rhythm.     Heart sounds: Normal heart sounds, S1 normal and S2 normal.  Pulmonary:     Effort: Pulmonary effort is normal.     Breath sounds: Normal breath sounds. No decreased breath sounds, wheezing, rhonchi or rales.   Lymphadenopathy:     Cervical: No cervical adenopathy.  Skin:    General: Skin is warm and dry.  Neurological:     Mental Status: He is alert.  Psychiatric:        Speech: Speech normal.        Behavior: Behavior normal. Behavior is cooperative.       Assessment and Plan:   Benjamin [name] was seen today for sore throat.  Diagnoses and all orders for this visit:  Flu-like symptoms and Cough No red flags on exam. Flu test negative. Received depo-medrol injection in office and tolerated well. Did give safety net prescription of Augmentin for him to use if symptoms worsen or do not improve with time.  Reviewed return precautions including worsening fever, SOB, worsening cough or other concerns. Push fluids and rest. I recommend that patient follow-up if symptoms worsen or persist despite treatment x 7-10 days, sooner if needed. -     POC Influenza A&B(BINAX/QUICKVUE) -     methylPREDNISolone acetate (DEPO-MEDROL) injection 80 mg  Other orders -     amoxicillin-clavulanate (AUGMENTIN) 875-125 MG tablet; Take 1 tablet by mouth 2 (two) times daily.  . Reviewed expectations re: course of current medical issues. . Discussed self-management of symptoms. . Outlined signs and symptoms indicating need for more acute intervention. . Patient verbalized understanding and all questions were answered. . See orders for this visit as documented in the electronic medical record. . Patient received an After-Visit Summary.  CMA or LPN served as  scribe during this visit. History, Physical, and Plan performed by medical provider. The above documentation has been reviewed and is accurate and complete.   Inda Coke, PA-C

## 2018-02-18 NOTE — Patient Instructions (Addendum)
It was great to see you!  You have a viral upper respiratory infection. Antibiotics are not needed for this.  Viral infections usually take 7-10 days to resolve.  The cough can last a few weeks to go away.  If your symptoms do not improve, or if they worsen, may start oral augmentin.  Push fluids and get plenty of rest. Please return if you are not improving as expected, or if you have high fevers (>101.5) or difficulty swallowing or worsening productive cough.  Call clinic with questions.  I hope you start feeling better soon!

## 2018-03-14 ENCOUNTER — Other Ambulatory Visit: Payer: Self-pay | Admitting: Family Medicine

## 2018-03-15 NOTE — Telephone Encounter (Signed)
Dr. Fry please advise on refill of medication.  Thanks  

## 2018-03-17 NOTE — Telephone Encounter (Signed)
Call in #120 with 5 rf 

## 2018-03-18 NOTE — Telephone Encounter (Signed)
Xanax refilled #120 with 5 rf okay per Dr Sarajane Jews Pt aware via [place].   Nothing further needed.

## 2018-03-18 NOTE — Telephone Encounter (Signed)
Please call in today as pt going out of town for 2 weeks.  alprazolam Duanne Moron) 2 MG tablet  [place], [place] (Phone) [phone] (Fax)

## 2018-03-18 NOTE — Telephone Encounter (Signed)
Requested medication (s) are due for refill today -yes   Requested medication (s) are on the active medication list -yes  Future visit scheduled -no  Last refill: 09/07/17  Notes to clinic: Patient is requesting refill of non delegated Rx- sent for provider review  Requested Prescriptions  Pending Prescriptions Disp Refills   alprazolam (XANAX) 2 MG tablet [Pharmacy Med Name: ALPRAZOLAM 2 MG TABLET] 120 tablet 0    Sig: TAKE 1 TABLET EVERY 6 HOURS AS NEEDED FOR ANXIETY.     Not Delegated - Psychiatry:  Anxiolytics/Hypnotics Failed - 03/18/2018  9:44 AM      Failed - This refill cannot be delegated      Failed - Urine Drug Screen completed in last 360 days.      Passed - Valid encounter within last 6 months    Recent Outpatient Visits          4 weeks ago Flu-like symptoms   [facility], [place], [place]   1 month ago Insomnia, unspecified type   [facility]rapist[facility] at [place], [place]   4 months ago Essential hypertension   [facility] at [facility], Ishmael Holter, [place]   1 year ago Unexplained weight loss   Therapist, [facility] at [facility], Ishmael Holter, [place]   1 year ago Weight loss   Therapist, music at [place], [place]              Requested Prescriptions  Pending Prescriptions Disp Refills   alprazolam (XANAX) 2 MG tablet [Pharmacy Med Name: ALPRAZOLAM 2 MG TABLET] 120 tablet 0    Sig: TAKE 1 TABLET EVERY 6 HOURS AS NEEDED FOR ANXIETY.     Not Delegated - Psychiatry:  Anxiolytics/Hypnotics Failed - 03/18/2018  9:44 AM      Failed - This refill cannot be delegated      Failed - Urine Drug Screen completed in last 360 days.      Passed - Valid encounter within last 6 months    Recent Outpatient Visits          4 weeks ago Flu-like symptoms   [facility] [place], [place], [place]   1 month ago Insomnia, unspecified type   Therapist, [facility] at [place], [place]   4  months ago Essential hypertension   Therapist, [facility] at [facility], Ishmael Holter, [place]   1 year ago Unexplained weight loss   Therapist, [facility] at [facility], Ishmael Holter, MD   1 year ago Weight loss   Therapist, music at [facility], Alinda Sierras, MD

## 2018-03-18 NOTE — Telephone Encounter (Signed)
Copied from [place] [id number]. Topic: Quick Communication - See Telephone Encounter >> Mar 18, 2018  9:36 AM [place] wrote: CRM for notification. See Telephone encounter for: 03/18/18.alprazolam Duanne Moron) 2 MG tablet Mr Anna says that the pharmacy at [facility] [place] has sent in a request for this on two separate days. The pharmacy has had no results and Mr Reason says he has not been contacted. He is going out of town tomorrow for a week and is adamant it needs to be refilled today. He is requesting communication from the practice if there is any issue but a refill today. His last appointment was less than 30 days ago. His phone is [phone].  [place], [place] [phone] (Phone) [phone] (Fax)

## 2018-04-18 ENCOUNTER — Other Ambulatory Visit: Payer: Self-pay | Admitting: Family Medicine

## 2018-07-19 ENCOUNTER — Other Ambulatory Visit: Payer: Self-pay

## 2018-07-19 ENCOUNTER — Ambulatory Visit (AMBULATORY_SURGERY_CENTER): Payer: Self-pay

## 2018-07-19 VITALS — Ht 71.0 in | Wt 220.0 lb

## 2018-07-19 DIAGNOSIS — Z86010 Personal history of colonic polyps: Secondary | ICD-10-CM

## 2018-07-19 NOTE — Progress Notes (Signed)
Denies allergies to eggs or soy products. Denies complication of anesthesia or sedation. Denies use of weight loss medication. Denies use of O2.   Emmi instructions given for colonoscopy.  Pre-Visit was conducted by phone due to Covid 19. Instructions were reviewed and mailed to patients confirmed home address. Patient was encouraged to call if he had any questions or concerns after reviewing the instructions.

## 2018-07-22 ENCOUNTER — Encounter: Payer: Self-pay | Admitting: Internal Medicine

## 2018-08-02 ENCOUNTER — Telehealth: Payer: Self-pay | Admitting: Internal Medicine

## 2018-08-02 IMAGING — CT CT ABD-PELV W/ CM
2 of 5 series · 14 of 46 positions shown, 16 images · IV contrast (ISOVUE 300)
Comparison: None.

CLINICAL DATA: Elevated PSA.  Unintended weight loss.

EXAM:
CT CHEST, ABDOMEN, AND PELVIS WITH CONTRAST
TECHNIQUE: Multidetector CT imaging of the chest, abdomen and pelvis was
performed following the standard protocol during bolus
administration of intravenous contrast.
CONTRAST:  100mL XGLW0N-Z22 IOPAMIDOL (XGLW0N-Z22) INJECTION 61%

[Series 2: cap with · axial · 0.79mm/px · z∈[-614,-54]mm · 11 of 134 slices shown, 13 images]
[im 11/134  soft-tissue]
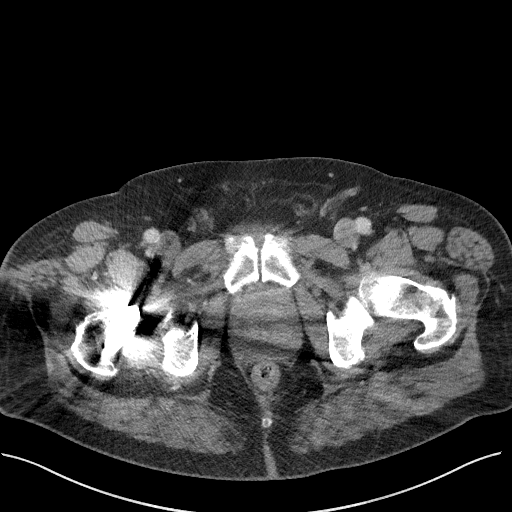
[im 11/134  bone]
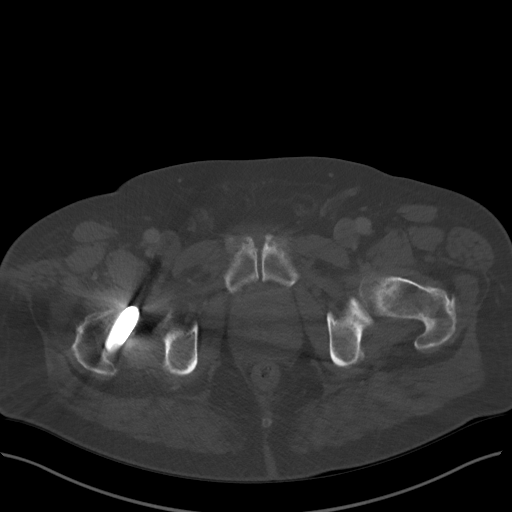
[im 21/134  soft-tissue]
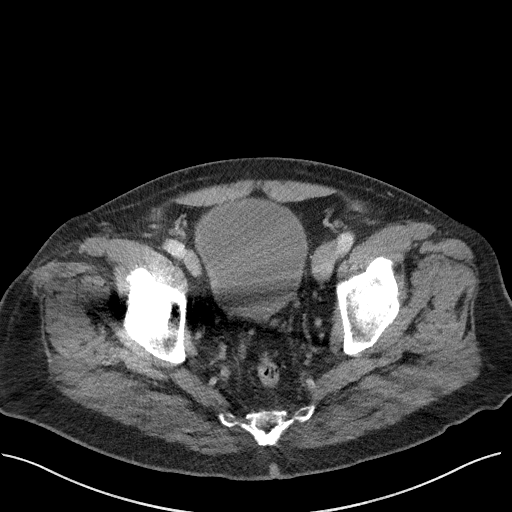
[im 31/134  soft-tissue]
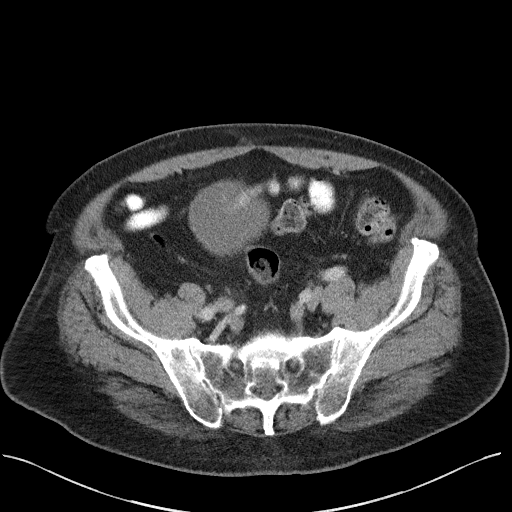
[im 41/134  soft-tissue]
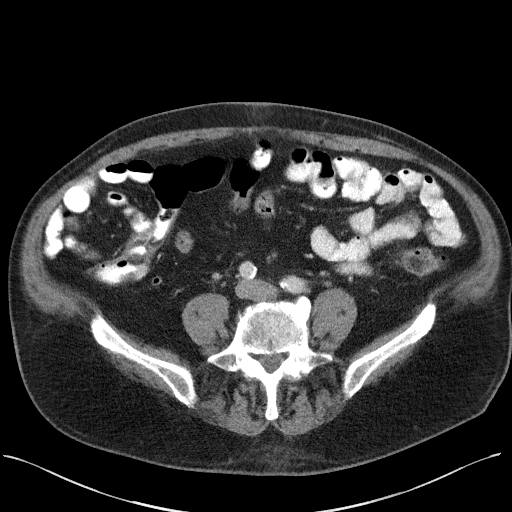
[im 52/134  soft-tissue]
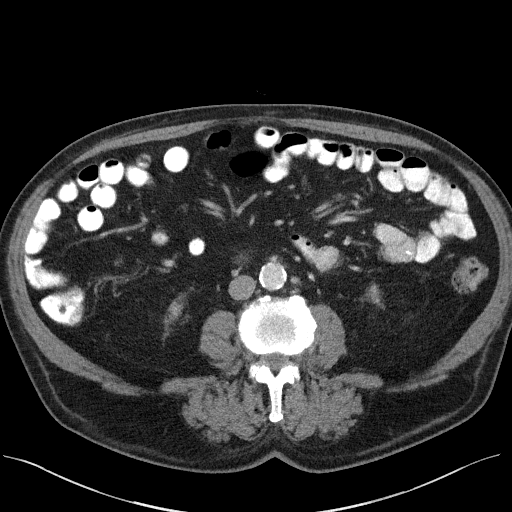
[im 72/134  soft-tissue]
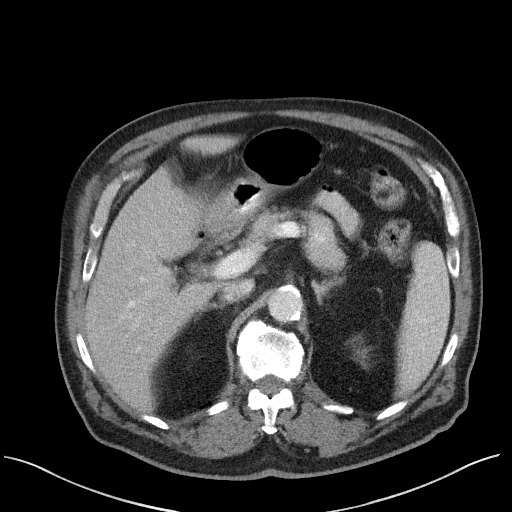
[im 82/134  soft-tissue]
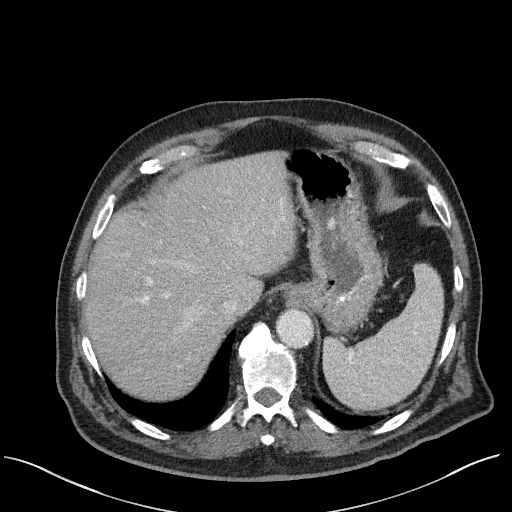
[im 93/134  soft-tissue]
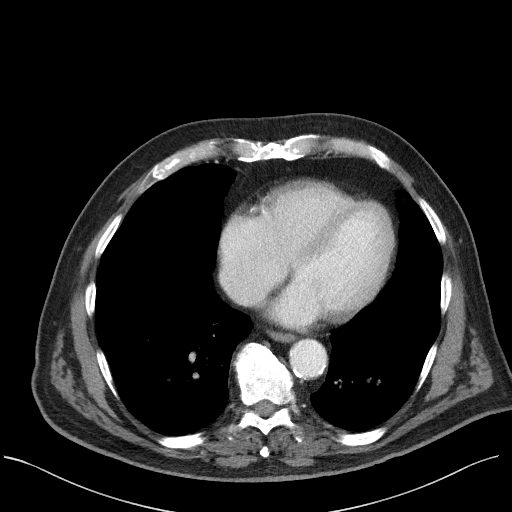
[im 103/134  soft-tissue]
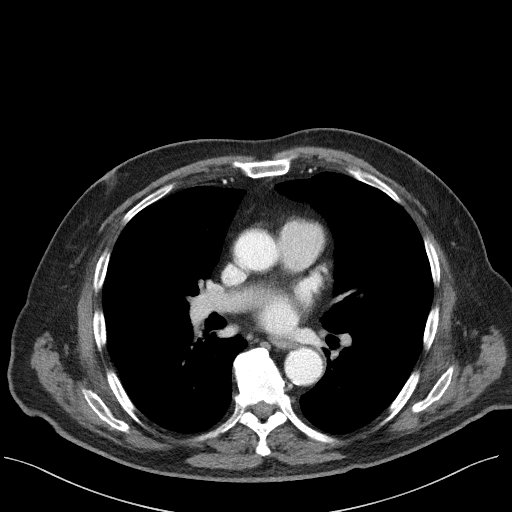
[im 103/134  bone]
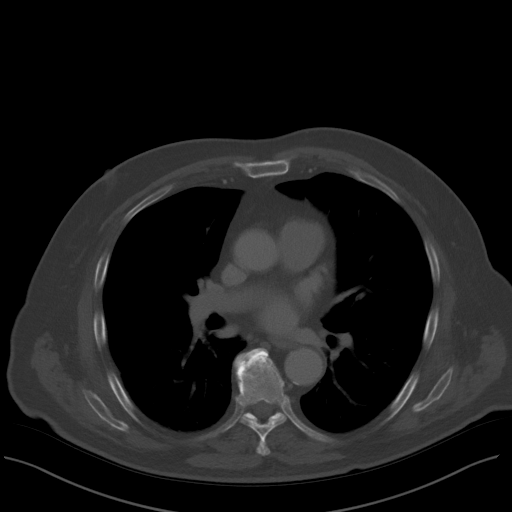
[im 113/134  soft-tissue]
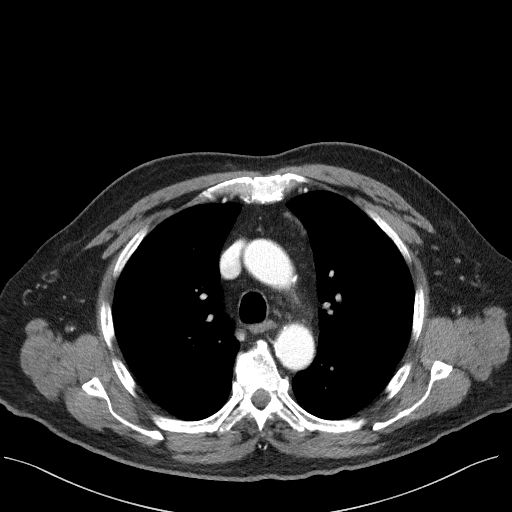
[im 123/134  soft-tissue]
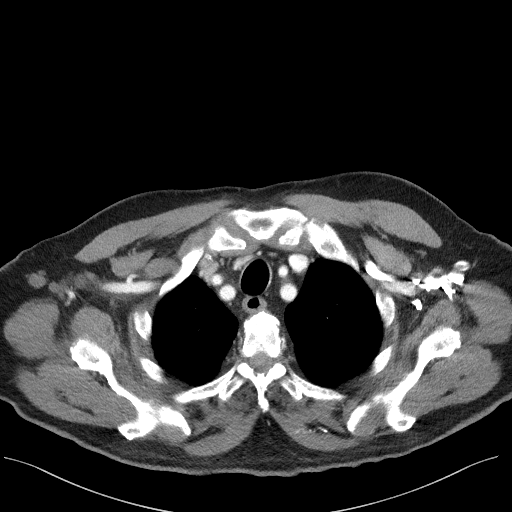

[Series 6: coronal · coronal · 0.81mm/px · 3 of 139 slices shown]
[im 47/139  soft-tissue]
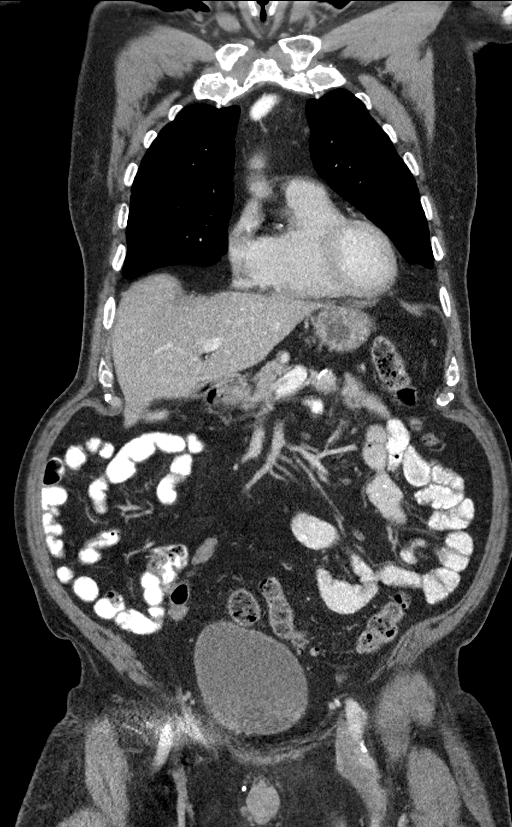
[im 62/139  soft-tissue]
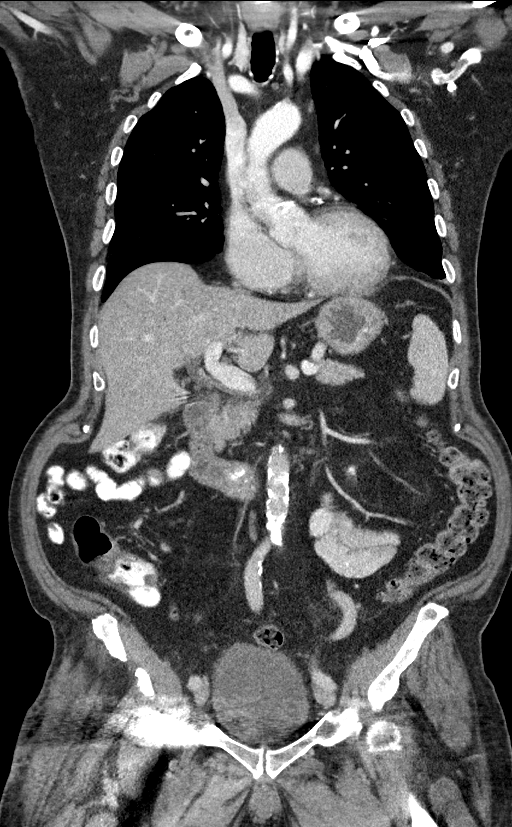
[im 77/139  soft-tissue]
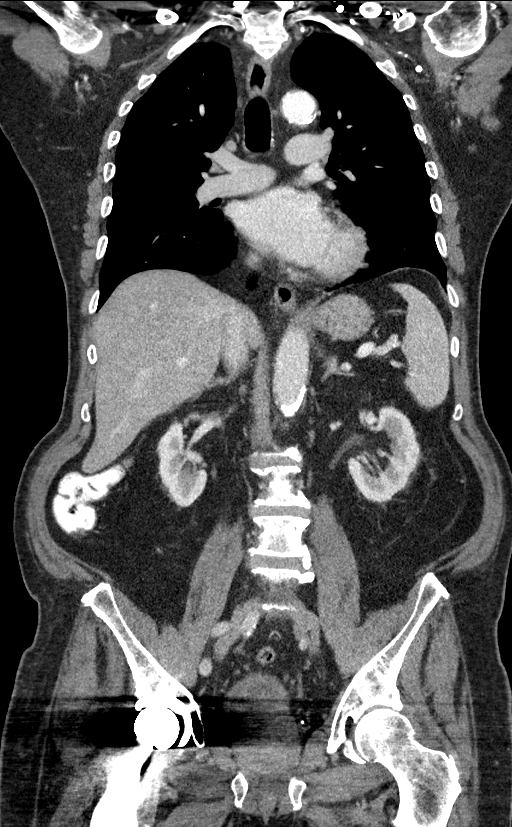

[14 of 46 positions shown; findings below may reference images not displayed]

FINDINGS: CT CHEST FINDINGS

Cardiovascular: Moderate coronary artery and aortic atherosclerosis.
Heart is normal size. Aorta is normal caliber.

Mediastinum/Nodes: No mediastinal, hilar, or axillary adenopathy.
Trachea and esophagus are unremarkable. Thyroid unremarkable.

Lungs/Pleura: Small nodules in the right middle lobe, the largest
measuring 5 mm on image 99. Smaller peripheral nodule on image 100.
No confluent opacities. No effusions.

Musculoskeletal: No acute bony abnormality. Degenerative changes in
the thoracic spine.

CT ABDOMEN PELVIS FINDINGS

Hepatobiliary: No focal liver abnormality is seen. Status post
cholecystectomy. No biliary dilatation.

Pancreas: No focal abnormality or ductal dilatation.

Spleen: No focal abnormality.  Normal size.

Adrenals/Urinary Tract: No adrenal abnormality. No focal renal
abnormality. No stones or hydronephrosis. Urinary bladder is
unremarkable.

Stomach/Bowel: Scattered sigmoid and descending colonic
diverticulosis. No active diverticulitis. Moderate stool burden in
the colon. Appendix normal. Stomach small bowel decompressed,
unremarkable.

Vascular/Lymphatic: Diffuse aortic and iliac calcifications. No
aneurysm or adenopathy.

Reproductive: Prostate enlargement.

Other: No free fluid or free air. Small left inguinal hernia
containing fat. Small umbilical hernia containing fat.

Musculoskeletal: No acute bony abnormality. Degenerative changes in
the lumbar spine. Prior right hip replacement.
IMPRESSION: Small nodules, 5 mm or less in the right middle lobe. No follow-up
needed if patient is low-risk (and has no known or suspected primary
neoplasm). Non-contrast chest CT can be considered in 12 months if
patient is high-risk. This recommendation follows the consensus
statement: Guidelines for Management of Incidental Pulmonary Nodules
Detected on CT Images: From the [HOSPITAL] 6597; Radiology
6597; [DATE].

Coronary artery disease, aortic atherosclerosis.

Scattered left colonic diverticulosis.  No active diverticulitis.

Small umbilical and left inguinal hernia containing fat.

No acute findings in the abdomen or pelvis.

## 2018-08-02 NOTE — Telephone Encounter (Signed)
Spoke w/patient and covid-19 questioned answered   Covid-19 Screening Questions: Do you now or have you had a fever in the last 14 days? NO Do you have any respiratory symptoms of shortness of breath or cough now or in the last 14 days? NO  Do you have any family members or close contacts with diagnosed or suspected Covid-19 in the past 14 days? NO Have you been tested for Covid-19 and found to be positive? NO

## 2018-08-03 ENCOUNTER — Ambulatory Visit (AMBULATORY_SURGERY_CENTER): Payer: Medicare Other | Admitting: Internal Medicine

## 2018-08-03 ENCOUNTER — Other Ambulatory Visit: Payer: Self-pay

## 2018-08-03 ENCOUNTER — Encounter: Payer: Self-pay | Admitting: Internal Medicine

## 2018-08-03 VITALS — BP 129/71 | HR 55 | Temp 97.8°F | Resp 12 | Ht 71.75 in | Wt 231.0 lb

## 2018-08-03 DIAGNOSIS — D122 Benign neoplasm of ascending colon: Secondary | ICD-10-CM

## 2018-08-03 DIAGNOSIS — D123 Benign neoplasm of transverse colon: Secondary | ICD-10-CM | POA: Diagnosis not present

## 2018-08-03 DIAGNOSIS — Z86010 Personal history of colonic polyps: Secondary | ICD-10-CM

## 2018-08-03 HISTORY — PX: COLONOSCOPY: SHX174

## 2018-08-03 MED ORDER — SODIUM CHLORIDE 0.9 % IV SOLN: 500.0000 mL | Freq: Once | INTRAVENOUS | Status: DC

## 2018-08-03 NOTE — Progress Notes (Addendum)
CW - temp JB- VS   Pt's states no medical or surgical changes since previsit or office visit.

## 2018-08-03 NOTE — Patient Instructions (Addendum)
I found and removed 2 tiny polyps.  You will not need any more routine colonoscopy testing.  I appreciate the opportunity to care for you. Gatha Mayer, MD, [facility]  Please read handouts provided. Continue present medications.     YOU HAD AN ENDOSCOPIC PROCEDURE TODAY AT [facility]:   Refer to the procedure report that was given to you for any specific questions about what was found during the examination.  If the procedure report does not answer your questions, please call your gastroenterologist to clarify.  If you requested that your care partner not be given the details of your procedure findings, then the procedure report has been included in a sealed envelope for you to review at your convenience later.  YOU SHOULD EXPECT: Some feelings of bloating in the abdomen. Passage of more gas than usual.  Walking can help get rid of the air that was put into your GI tract during the procedure and reduce the bloating. If you had a lower endoscopy (such as a colonoscopy or flexible sigmoidoscopy) you may notice spotting of blood in your stool or on the toilet paper. If you underwent a bowel prep for your procedure, you may not have a normal bowel movement for a few days.  Please Note:  You might notice some irritation and congestion in your nose or some drainage.  This is from the oxygen used during your procedure.  There is no need for concern and it should clear up in a day or so.  SYMPTOMS TO REPORT IMMEDIATELY:   Following lower endoscopy (colonoscopy or flexible sigmoidoscopy):  Excessive amounts of blood in the stool  Significant tenderness or worsening of abdominal pains  Swelling of the abdomen that is new, acute  Fever of 100F or higher   For urgent or emergent issues, a gastroenterologist can be reached at any hour by calling [phone].   DIET:  We do recommend a small meal at first, but then you may proceed to your regular diet.  Drink plenty of  fluids but you should avoid alcoholic beverages for 24 hours.  ACTIVITY:  You should plan to take it easy for the rest of today and you should NOT DRIVE or use heavy machinery until tomorrow (because of the sedation medicines used during the test).    FOLLOW UP: Our staff will call the number listed on your records 48-72 hours following your procedure to check on you and address any questions or concerns that you may have regarding the information given to you following your procedure. If we do not reach you, we will leave a message.  We will attempt to reach you two times.  During this call, we will ask if you have developed any symptoms of COVID 19. If you develop any symptoms (ie: fever, flu-like symptoms, shortness of breath, cough etc.) before then, please call [phone].  If you test positive for Covid 19 in the 2 weeks post procedure, please call and report this information to [place].    If any biopsies were taken you will be contacted by phone or by letter within the next 1-3 weeks.  Please call us at [phone] if you have not heard about the biopsies in 3 weeks.    SIGNATURES/CONFIDENTIALITY: You and/or your care partner have signed paperwork which will be entered into your electronic medical record.  These signatures attest to the fact that that the information above on your After Visit Summary has been reviewed and is  understood.  Full responsibility of the confidentiality of this discharge information lies with you and/or your care-partner. 

## 2018-08-03 NOTE — Progress Notes (Signed)
A/ox3, pleased with MAC, report to RN 

## 2018-08-03 NOTE — Progress Notes (Signed)
Called to room to assist during endoscopic procedure.  Patient ID and intended procedure confirmed with present staff. Received instructions for my participation in the procedure from the performing physician.  

## 2018-08-03 NOTE — Op Note (Signed)
[facility] Patient Name: Benjamin [name] Procedure Date: 08/03/2018 1:00 PM MRN: [id number] Endoscopist: Gatha Mayer , MD Age: 78 Referring MD:  Date of Birth: 21-May-1940 Gender: Male Account #: [id number] Procedure:                Colonoscopy Indications:              Surveillance: Personal history of adenomatous                            polyps on last colonoscopy 3 years ago Medicines:                Propofol per Anesthesia, Monitored Anesthesia Care Procedure:                Pre-Anesthesia Assessment:                           - Prior to the procedure, a History and Physical                            was performed, and patient medications and                            allergies were reviewed. The patient's tolerance of                            previous anesthesia was also reviewed. The risks                            and benefits of the procedure and the sedation                            options and risks were discussed with the patient.                            All questions were answered, and informed consent                            was obtained. Prior Anticoagulants: The patient has                            taken no previous anticoagulant or antiplatelet                            agents. ASA Grade Assessment: II - A patient with                            mild systemic disease. After reviewing the risks                            and benefits, the patient was deemed in                            satisfactory condition to undergo the procedure.  After obtaining informed consent, the colonoscope                            was passed under direct vision. Throughout the                            procedure, the patient's blood pressure, pulse, and                            oxygen saturations were monitored continuously. The                            Model CF-[id number] ([id number]) scope was introduced   through the anus and advanced to the the cecum,                            identified by appendiceal orifice and ileocecal                            valve. The patient tolerated the procedure well.                            The quality of the bowel preparation was good. The                            ileocecal valve, appendiceal orifice, and rectum                            were photographed. The bowel preparation used was                            Miralax via split dose instruction. The colonoscopy                            was somewhat difficult due to a redundant colon.                            Successful completion of the procedure was aided by                            applying abdominal pressure. Scope In: 1:15:57 PM Scope Out: 1:34:06 PM Scope Withdrawal Time: 0 hours 12 minutes 39 seconds  Total Procedure Duration: 0 hours 18 minutes 9 seconds  Findings:                 The perianal and digital rectal examinations were                            normal.                           Two sessile polyps were found in the transverse                            colon and ascending colon.  The polyps were                            diminutive in size. These polyps were removed with                            a cold snare. Resection and retrieval were                            complete. Verification of patient identification                            for the specimen was done. Estimated blood loss was                            minimal.                           Multiple diverticula were found in the sigmoid                            colon.                           The exam was otherwise without abnormality on                            direct and retroflexion views. Complications:            No immediate complications. Estimated Blood Loss:     Estimated blood loss was minimal. Impression:               - Two diminutive polyps in the transverse colon and                             in the ascending colon, removed with a cold snare.                            Resected and retrieved.                           - Diverticulosis in the sigmoid colon.                           - The examination was otherwise normal on direct                            and retroflexion views.                           - Personal history of colonic polyps. Adenomas Recommendation:           - Patient has a contact number available for                            emergencies. The signs and symptoms of potential  delayed complications were discussed with the                            patient. Return to normal activities tomorrow.                            Written discharge instructions were provided to the                            patient.                           - Resume previous diet.                           - Continue present medications.                           - No repeat colonoscopy due to age. Gatha Mayer, MD 08/03/2018 1:45:09 PM This report has been signed electronically.

## 2018-08-05 ENCOUNTER — Telehealth: Payer: Self-pay | Admitting: *Deleted

## 2018-08-05 NOTE — Telephone Encounter (Signed)
  Follow up Call-  Call back number 08/03/2018  Post procedure Call Back phone  # [phone] cell  Permission to leave phone message Yes  Some recent data might be hidden     Patient questions:  Do you have a fever, pain , or abdominal swelling? No. Pain Score  0 *  Have you tolerated food without any problems? Yes.    Have you been able to return to your normal activities? Yes.    Do you have any questions about your discharge instructions: Diet   No. Medications  No. Follow up visit  No.  Do you have questions or concerns about your Care? No.  Actions: * If pain score is 4 or above: No action needed, pain <4.  1. Have you developed a fever since your procedure? NO  2.   Have you had an respiratory symptoms (SOB or cough) since your procedure? NO  3.   Have you tested positive for COVID 19 since your procedure NO  4.   Have you had any family members/close contacts diagnosed with the COVID 19 since your procedure?  NO   If yes to any of these questions please route to Joylene John, RN and Alphonsa Gin, RN.

## 2018-08-09 ENCOUNTER — Encounter: Payer: Self-pay | Admitting: Internal Medicine

## 2018-08-09 NOTE — Progress Notes (Signed)
2 adenomas No recall - age My Chart

## 2018-08-12 ENCOUNTER — Encounter: Payer: Self-pay | Admitting: Family Medicine

## 2018-08-15 NOTE — Telephone Encounter (Signed)
Please schedule virtual visit to discuss possible medication changes. Pt has not been seen in 38mos and that was acute visit.

## 2018-08-16 ENCOUNTER — Encounter: Payer: Self-pay | Admitting: Family Medicine

## 2018-08-16 ENCOUNTER — Other Ambulatory Visit: Payer: Self-pay

## 2018-08-16 ENCOUNTER — Ambulatory Visit (INDEPENDENT_AMBULATORY_CARE_PROVIDER_SITE_OTHER): Payer: Medicare Other | Admitting: Family Medicine

## 2018-08-16 DIAGNOSIS — G4700 Insomnia, unspecified: Secondary | ICD-10-CM | POA: Diagnosis not present

## 2018-08-16 MED ORDER — AMLODIPINE BESYLATE 10 MG PO TABS: 10.0000 mg | ORAL_TABLET | Freq: Every day | ORAL | 3 refills | Status: DC

## 2018-08-16 MED ORDER — LISINOPRIL 10 MG PO TABS: 10.0000 mg | ORAL_TABLET | Freq: Every day | ORAL | 3 refills | Status: DC

## 2018-08-16 MED ORDER — TRAZODONE HCL 100 MG PO TABS: 100.0000 mg | ORAL_TABLET | Freq: Every day | ORAL | 3 refills | Status: DC

## 2018-08-16 NOTE — Progress Notes (Signed)
Subjective:    Patient ID: Benjamin [name], male    DOB: Jul 18, 1940, 78 y.o.   MRN: [id number]  HPI Virtual Visit via Video Note  I connected with the patient on 08/16/18 at 11:30 AM EDT by a video enabled telemedicine application and verified that I am speaking with the correct person using two identifiers.  Location patient: home Location provider:work or home office [name], provider  I discussed the limitations of evaluation and management by telemedicine and the availability of in person appointments. The patient expressed understanding and agreed to proceed.   HPI: Here to follow up on insomnia. He still has trouble sleeping most nights and the Xanax does not help much. He had tried Trazodone 50 mg a few years ago, and he had mixed results. He wants to try this again. In general he has been doing well.    ROS: See pertinent positives and negatives per HPI.  Past Medical History:  Diagnosis Date  . Allergy   . Anxiety   . Arthritis   . BPH (benign prostatic hyperplasia)    with elevated PSA, sees Dr. Diona Fanti, benign  . Cataract   . Elevated PSA   . GERD (gastroesophageal reflux disease)   . Hyperlipidemia   . Hypertension   . Insomnia   . Platelets decreased ([place])   . Pneumonia    walking PNA  . Pre-diabetes    No meds    Past Surgical History:  Procedure Laterality Date  . CHOLECYSTECTOMY N/A 01/10/2014   Procedure: LAPAROSCOPIC CHOLECYSTECTOMY WITH INTRAOPERATIVE CHOLANGIOGRAM;  Surgeon: Pedro Earls, MD;  Location: [facility];  Service: General;  Laterality: N/A;  . COLONOSCOPY  07/11/2015   per Dr. Carlean Purl, adenomatous polyps, repeat in 3 yrs   . JOINT REPLACEMENT     Right hip arthroplasty Dr. Alvan Dame 01/05/17   . left knee arthroscopy     . LIPOSUCTION  1987  . prostate biopsies in 2003 and again 05/2008     per Dr. Diona Fanti. benign  . REFRACTIVE SURGERY    . repair  of large umblicial hernia     per Dr. Kaylyn Lim on 01/10/10  . right knee arthroscopy    . TONSILLECTOMY    . 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 TOTAL KNEE ARTHROPLASTY Left 07/16/2014   Procedure: LEFT TOTAL KNEE ARTHROPLASTY;  Surgeon: Paralee Cancel, MD;  Location: [facility];  Service: Orthopedics;  Laterality: Left;    Family History  Problem Relation Age of Onset  . Alzheimer's disease Other   . Colon cancer Neg Hx   . Esophageal cancer Neg Hx   . Pancreatic cancer Neg Hx   . Prostate cancer Neg Hx   . Rectal cancer Neg Hx   . Stomach cancer Neg Hx      Current Outpatient Medications:  .  alprazolam (XANAX) 2 MG tablet, TAKE 1 TABLET EVERY 6 HOURS AS NEEDED FOR ANXIETY., Disp: 120 tablet, Rfl: 5 .  amLODipine (NORVASC) 10 MG tablet, Take 1 tablet (10 mg total) by mouth daily., Disp: 90 tablet, Rfl: 3 .  lisinopril (ZESTRIL) 10 MG tablet, Take 1 tablet (10 mg total) by mouth daily., Disp: 90 tablet, Rfl: 3 .  naproxen sodium (ALEVE) 220 MG tablet, Take 220 mg by mouth., Disp: , Rfl:  .  omeprazole (PRILOSEC) 40 MG capsule, TAKE (1) CAPSULE TWICE DAILY., Disp: 180 capsule, Rfl: 2 .  polyethylene glycol (MIRALAX / GLYCOLAX) packet, Take 17 g by mouth 2 (two) times daily., Disp: 14 each, Rfl: 0 .  traZODone (DESYREL) 100 MG tablet, Take 1 tablet (100 mg total) by mouth at bedtime., Disp: 90 tablet, Rfl: 3  EXAM:  VITALS per patient if applicable:  GENERAL: alert, oriented, appears well and in no acute distress  HEENT: atraumatic, conjunttiva clear, no obvious abnormalities on inspection of external nose and ears  NECK: normal movements of the head and neck  LUNGS: on inspection no signs of respiratory distress, breathing rate appears normal, no obvious gross SOB, gasping or wheezing  CV: no obvious cyanosis  MS: moves all visible extremities without  noticeable abnormality  PSYCH/NEURO: pleasant and cooperative, no obvious depression or anxiety, speech and thought processing grossly intact  ASSESSMENT AND PLAN: Insomnia, he will try Trazodone again but at the 100 mg dose. Recheck prn. Alysia Penna, MD  Discussed the following assessment and plan:  No diagnosis found.     I discussed the assessment and treatment plan with the patient. The patient was provided an opportunity to ask questions and all were answered. The patient agreed with the plan and demonstrated an understanding of the instructions.   The patient was advised to call back or seek an in-person evaluation if the symptoms worsen or if the condition fails to improve as anticipated.     Review of Systems     Objective:   Physical Exam        Assessment & Plan:

## 2018-08-29 DIAGNOSIS — H35129 Retinopathy of prematurity, stage 1, unspecified eye: Secondary | ICD-10-CM | POA: Diagnosis not present

## 2018-09-14 ENCOUNTER — Other Ambulatory Visit: Payer: Self-pay | Admitting: Family Medicine

## 2018-09-14 NOTE — Telephone Encounter (Signed)
Last filled 03/18/2018 Last OV 08/16/2018  Ok to fill?

## 2018-10-05 DIAGNOSIS — H353131 Nonexudative age-related macular degeneration, bilateral, early dry stage: Secondary | ICD-10-CM | POA: Diagnosis not present

## 2018-10-05 DIAGNOSIS — H401131 Primary open-angle glaucoma, bilateral, mild stage: Secondary | ICD-10-CM | POA: Diagnosis not present

## 2018-10-05 DIAGNOSIS — H25812 Combined forms of age-related cataract, left eye: Secondary | ICD-10-CM | POA: Diagnosis not present

## 2018-10-07 ENCOUNTER — Telehealth: Payer: Self-pay

## 2018-10-07 NOTE — Telephone Encounter (Signed)
received fax from [facility] for surgical clearance. BP was 200/88 on 10/05/2018 in their office.  Per Dr. Sarajane Jews patient needs an in office appointment for BP check before clearing the patient.  Appointment scheduled. Nothing further needed.

## 2018-10-10 ENCOUNTER — Ambulatory Visit: Payer: Medicare Other | Admitting: Family Medicine

## 2018-10-11 ENCOUNTER — Encounter: Payer: Self-pay | Admitting: Family Medicine

## 2018-10-11 ENCOUNTER — Ambulatory Visit (INDEPENDENT_AMBULATORY_CARE_PROVIDER_SITE_OTHER): Payer: Medicare Other | Admitting: Family Medicine

## 2018-10-11 ENCOUNTER — Other Ambulatory Visit: Payer: Self-pay

## 2018-10-11 VITALS — BP 140/60 | HR 68 | Temp 98.4°F | Wt 219.0 lb

## 2018-10-11 DIAGNOSIS — I10 Essential (primary) hypertension: Secondary | ICD-10-CM

## 2018-10-11 DIAGNOSIS — Z23 Encounter for immunization: Secondary | ICD-10-CM

## 2018-10-11 NOTE — Progress Notes (Signed)
   Subjective:    Patient ID: Benjamin [name], male    DOB: [birth year], 78 y.o.   MRN: [id number]  HPI Here to follow up on BP. He has felt well and his BP has been stable at home in the 130s to140s over 70s to 80s. He went to [facility] recently and he was diagnosed with glaucoma and cataracts. He is now using drops (he does not remember the name of them) and his pressures are back to 15. They are planning cataract surgery as well, but at his last visit with them on8-26-20 his BP was up to 200/88. They have sent him back to me to evaluate and clear him before they will proceed. Today he feels fine.   Review of Systems  Constitutional: Negative.   Respiratory: Negative.   Cardiovascular: Negative.   Neurological: Negative.        Objective:   Physical Exam Constitutional:      Appearance: Normal appearance.  Cardiovascular:     Rate and Rhythm: Normal rate and regular rhythm.     Pulses: Normal pulses.     Heart sounds: Normal heart sounds.  Pulmonary:     Effort: Pulmonary effort is normal.     Breath sounds: Normal breath sounds.  Musculoskeletal:     Right lower leg: No edema.     Left lower leg: No edema.  Neurological:     General: No focal deficit present.     Mental Status: He is alert and oriented to person, place, and time.           Assessment & Plan:  His HTN is well controlled. I think he simply was very anxious the day he was at the eye clinic. We will clear him for cataract surgery.  Alysia Penna, MD

## 2018-11-03 ENCOUNTER — Telehealth: Payer: Self-pay

## 2018-11-03 NOTE — Telephone Encounter (Signed)
Copied from [place] [id number]. 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  Pt's wife is high risk and caregivers are telling him to either get tested or wait 14 day quarantine period before being able to be in the home again. Pt wants to know if Dr. Sarajane Jews would see him or if he would recommend that pt go ahead and get tested. Pt can be reached at [phone].

## 2018-11-04 ENCOUNTER — Telehealth (INDEPENDENT_AMBULATORY_CARE_PROVIDER_SITE_OTHER): Payer: Medicare Other | Admitting: Family Medicine

## 2018-11-04 ENCOUNTER — Encounter: Payer: Self-pay | Admitting: Family Medicine

## 2018-11-04 ENCOUNTER — Other Ambulatory Visit: Payer: Self-pay

## 2018-11-04 DIAGNOSIS — Z209 Contact with and (suspected) exposure to unspecified communicable disease: Secondary | ICD-10-CM | POA: Diagnosis not present

## 2018-11-04 DIAGNOSIS — R6889 Other general symptoms and signs: Secondary | ICD-10-CM | POA: Diagnosis not present

## 2018-11-04 DIAGNOSIS — Z20822 Contact with and (suspected) exposure to covid-19: Secondary | ICD-10-CM

## 2018-11-04 NOTE — Telephone Encounter (Signed)
He was seen today

## 2018-11-04 NOTE — Progress Notes (Signed)
Virtual Visit via Video Note  I connected with the patient on 11/04/18 at  9:00 AM EDT by a video enabled telemedicine application and verified that I am speaking with the correct person using two identifiers.  Location patient: home Location provider:work or home office [name], provider  I discussed the limitations of evaluation and management by telemedicine and the availability of in person appointments. The patient expressed understanding and agreed to proceed.   HPI: Here to ask advice about Covid testing. He is driving back today from a trade show in [place], and he was exposed to many people in crowded rooms. He has caregivers at home who take care of his invalid wife, and they are requiring that he either be tested for the Covid virus or that he quarantine himself for 14 days before he comes home. He had some chills and a ST last night, but today he feels fine.    ROS: See pertinent positives and negatives per HPI.  Past Medical History:  Diagnosis Date  . Allergy   . Anxiety   . Arthritis   . BPH (benign prostatic hyperplasia)    with elevated PSA, sees Dr. Diona Fanti, benign  . Cataract   . Elevated PSA   . GERD (gastroesophageal reflux disease)   . Hyperlipidemia   . Hypertension   . Insomnia   . Platelets decreased ([place])   . Pneumonia    walking PNA  . Pre-diabetes    No meds    Past Surgical History:  Procedure Laterality Date  . CHOLECYSTECTOMY N/A 01/10/2014   Procedure: LAPAROSCOPIC CHOLECYSTECTOMY WITH INTRAOPERATIVE CHOLANGIOGRAM;  Surgeon: Pedro Earls, MD;  Location: [facility];  Service: General;  Laterality: N/A;  . COLONOSCOPY  07/11/2015   per Dr. Carlean Purl, adenomatous polyps, repeat in 3 yrs   . JOINT REPLACEMENT     Right hip arthroplasty Dr. Alvan Dame 01/05/17   . left knee arthroscopy     . LIPOSUCTION  1987  . prostate biopsies in 2003 and again 05/2008     per Dr. Diona Fanti. benign  . REFRACTIVE  SURGERY    . repair  of large umblicial hernia     per Dr. Kaylyn Lim on 01/10/10  . right knee arthroscopy    . TONSILLECTOMY    . 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 TOTAL KNEE ARTHROPLASTY Left 07/16/2014   Procedure: LEFT TOTAL KNEE ARTHROPLASTY;  Surgeon: Paralee Cancel, MD;  [facility]: [facility];  Service: Orthopedics;  Laterality: Left;    Family History  Problem Relation Age of Onset  . Alzheimer's disease Other   . Colon cancer Neg Hx   . Esophageal cancer Neg Hx   . Pancreatic cancer Neg Hx   . Prostate cancer Neg Hx   . Rectal cancer Neg Hx   . Stomach cancer Neg Hx      Current Outpatient Medications:  .  alprazolam (XANAX) 2 MG tablet, TAKE 1 TABLET EVERY 6 HOURS AS NEEDED FOR ANXIETY., Disp: 120 tablet, Rfl: 5 .  amLODipine (NORVASC) 10 MG tablet, Take 1 tablet (10 mg total) by mouth daily., Disp: 90 tablet, Rfl: 3 .  lisinopril (ZESTRIL) 10 MG tablet, Take 1 tablet (10 mg total) by mouth daily., Disp: 90 tablet, Rfl: 3 .  naproxen sodium (ALEVE) 220 MG tablet, Take  220 mg by mouth., Disp: , Rfl:  .  omeprazole (PRILOSEC) 40 MG capsule, TAKE (1) CAPSULE TWICE DAILY., Disp: 180 capsule, Rfl: 2 .  polyethylene glycol (MIRALAX / GLYCOLAX) packet, Take 17 g by mouth 2 (two) times daily., Disp: 14 each, Rfl: 0 .  traZODone (DESYREL) 100 MG tablet, Take 1 tablet (100 mg total) by mouth at bedtime., Disp: 90 tablet, Rfl: 3  EXAM:  VITALS per patient if applicable:  GENERAL: alert, oriented, appears well and in no acute distress  HEENT: atraumatic, conjunttiva clear, no obvious abnormalities on inspection of external nose and ears  NECK: normal movements of the head and neck  LUNGS: on inspection no signs of respiratory distress, breathing rate appears normal, no obvious gross SOB, gasping or wheezing  CV: no  obvious cyanosis  MS: moves all visible extremities without noticeable abnormality  PSYCH/NEURO: pleasant and cooperative, no obvious depression or anxiety, speech and thought processing grossly intact  ASSESSMENT AND PLAN: Possible exposure to Covid. I advised him to go to the [facility] testing site today and be tested. He agreed.  Alysia Penna, MD  Discussed the following assessment and plan:  No diagnosis found.     I discussed the assessment and treatment plan with the patient. The patient was provided an opportunity to ask questions and all were answered. The patient agreed with the plan and demonstrated an understanding of the instructions.   The patient was advised to call back or seek an in-person evaluation if the symptoms worsen or if the condition fails to improve as anticipated.

## 2018-11-05 LAB — NOVEL CORONAVIRUS, NAA: SARS-CoV-2, NAA: NOT DETECTED

## 2018-11-11 ENCOUNTER — Other Ambulatory Visit: Payer: Self-pay

## 2018-11-11 DIAGNOSIS — Z20828 Contact with and (suspected) exposure to other viral communicable diseases: Secondary | ICD-10-CM | POA: Diagnosis not present

## 2018-11-11 DIAGNOSIS — Z20822 Contact with and (suspected) exposure to covid-19: Secondary | ICD-10-CM

## 2018-11-12 LAB — NOVEL CORONAVIRUS, NAA: SARS-CoV-2, NAA: NOT DETECTED

## 2018-11-14 ENCOUNTER — Other Ambulatory Visit: Payer: Self-pay | Admitting: Family Medicine

## 2018-11-28 ENCOUNTER — Other Ambulatory Visit: Payer: Self-pay

## 2018-11-28 DIAGNOSIS — Z20822 Contact with and (suspected) exposure to covid-19: Secondary | ICD-10-CM

## 2018-11-30 LAB — NOVEL CORONAVIRUS, NAA: SARS-CoV-2, NAA: NOT DETECTED

## 2019-01-25 ENCOUNTER — Encounter: Payer: Self-pay | Admitting: Family Medicine

## 2019-02-15 ENCOUNTER — Other Ambulatory Visit: Payer: Self-pay | Admitting: Family Medicine

## 2019-02-15 NOTE — Telephone Encounter (Signed)
Last filled 09/14/2018 Last OV 11/04/2018 Ok to fill?

## 2019-02-23 ENCOUNTER — Ambulatory Visit: Payer: Medicare Other | Attending: Internal Medicine

## 2019-02-23 DIAGNOSIS — Z23 Encounter for immunization: Secondary | ICD-10-CM | POA: Insufficient documentation

## 2019-02-23 NOTE — Progress Notes (Signed)
   Covid-19 Vaccination Clinic  Name:  Roth Hopf    MRN: [id number] DOB: [birth year]  02/23/2019  Mr. Kelley was observed post Covid-19 immunization for 15 minutes without incidence. He was provided with Vaccine Information Sheet and instruction to access the V-Safe system.   Mr. Epps was instructed to call 911 with any severe reactions post vaccine: Marland Kitchen Difficulty breathing  . Swelling of your face and throat  . A fast heartbeat  . A bad rash all over your body  . Dizziness and weakness    Immunizations Administered    Name Date Dose VIS Date Route   Pfizer COVID-19 Vaccine 02/23/2019 11:11 AM 0.3 mL 01/20/2019 Intramuscular   Manufacturer: [employer]   Lot: [id number]   [place]: [id number]

## 2019-03-01 ENCOUNTER — Other Ambulatory Visit: Payer: Self-pay | Admitting: Family Medicine

## 2019-03-16 ENCOUNTER — Ambulatory Visit: Payer: Medicare Other | Attending: Internal Medicine

## 2019-03-16 DIAGNOSIS — Z23 Encounter for immunization: Secondary | ICD-10-CM | POA: Insufficient documentation

## 2019-03-16 NOTE — Progress Notes (Signed)
   Covid-19 Vaccination Clinic  Name:  Kipp Honer    MRN: [id number] DOB: [birth year]  03/16/2019  Mr. Schaum was observed post Covid-19 immunization for 15 minutes without incidence. He was provided with Vaccine Information Sheet and instruction to access the V-Safe system.   Mr. Stvil was instructed to call 911 with any severe reactions post vaccine: Marland Kitchen Difficulty breathing  . Swelling of your face and throat  . A fast heartbeat  . A bad rash all over your body  . Dizziness and weakness    Immunizations Administered    Name Date Dose VIS Date Route   Pfizer COVID-19 Vaccine 03/16/2019 10:33 AM 0.3 mL 01/20/2019 Intramuscular   Manufacturer: [place]   Lot: [id number]   [place]: [id number]

## 2019-05-26 ENCOUNTER — Other Ambulatory Visit: Payer: Self-pay

## 2019-05-29 ENCOUNTER — Other Ambulatory Visit: Payer: Self-pay

## 2019-05-29 ENCOUNTER — Ambulatory Visit (INDEPENDENT_AMBULATORY_CARE_PROVIDER_SITE_OTHER): Payer: Medicare Other | Admitting: Family Medicine

## 2019-05-29 ENCOUNTER — Encounter: Payer: Self-pay | Admitting: Family Medicine

## 2019-05-29 VITALS — BP 130/64 | HR 67 | Temp 97.9°F | Ht 71.75 in | Wt 226.6 lb

## 2019-05-29 DIAGNOSIS — E785 Hyperlipidemia, unspecified: Secondary | ICD-10-CM

## 2019-05-29 DIAGNOSIS — N401 Enlarged prostate with lower urinary tract symptoms: Secondary | ICD-10-CM | POA: Diagnosis not present

## 2019-05-29 DIAGNOSIS — E119 Type 2 diabetes mellitus without complications: Secondary | ICD-10-CM

## 2019-05-29 DIAGNOSIS — I10 Essential (primary) hypertension: Secondary | ICD-10-CM

## 2019-05-29 DIAGNOSIS — F418 Other specified anxiety disorders: Secondary | ICD-10-CM

## 2019-05-29 DIAGNOSIS — K219 Gastro-esophageal reflux disease without esophagitis: Secondary | ICD-10-CM

## 2019-05-29 DIAGNOSIS — N138 Other obstructive and reflux uropathy: Secondary | ICD-10-CM | POA: Diagnosis not present

## 2019-05-29 DIAGNOSIS — G4700 Insomnia, unspecified: Secondary | ICD-10-CM

## 2019-05-29 LAB — BASIC METABOLIC PANEL
BUN: 18 mg/dL (ref 6–23)
CO2: 29 mEq/L (ref 19–32)
Calcium: 9.4 mg/dL (ref 8.4–10.5)
Chloride: 101 mEq/L (ref 96–112)
Creatinine, Ser: 0.95 mg/dL (ref 0.40–1.50)
GFR: 76.62 mL/min (ref >60.00)
Glucose, Bld: 110 mg/dL — ABNORMAL HIGH (ref 70–99)
Potassium: 3.9 mEq/L (ref 3.5–5.1)
Sodium: 139 mEq/L (ref 135–145)

## 2019-05-29 LAB — HEPATIC FUNCTION PANEL
ALT: 24 U/L (ref 0–53)
AST: 20 U/L (ref 0–37)
Albumin: 4.4 g/dL (ref 3.5–5.2)
Alkaline Phosphatase: 58 U/L (ref 39–117)
Bilirubin, Direct: 0.2 mg/dL (ref 0.0–0.3)
Total Bilirubin: 1.3 mg/dL — ABNORMAL HIGH (ref 0.2–1.2)
Total Protein: 6.5 g/dL (ref 6.0–8.3)

## 2019-05-29 LAB — CBC WITH DIFFERENTIAL/PLATELET
Basophils Absolute: 0 10*3/uL (ref 0.0–0.1)
Basophils Relative: 0.6 % (ref 0.0–3.0)
Eosinophils Absolute: 0.1 10*3/uL (ref 0.0–0.7)
Eosinophils Relative: 2.6 % (ref 0.0–5.0)
HCT: 45 % (ref 39.0–52.0)
Hemoglobin: 15.2 g/dL (ref 13.0–17.0)
Lymphocytes Relative: 34 % (ref 12.0–46.0)
Lymphs Abs: 1.5 10*3/uL (ref 0.7–4.0)
MCHC: 33.8 g/dL (ref 30.0–36.0)
MCV: 94.8 fl (ref 78.0–100.0)
Monocytes Absolute: 0.5 10*3/uL (ref 0.1–1.0)
Monocytes Relative: 11.6 % (ref 3.0–12.0)
Neutro Abs: 2.3 10*3/uL (ref 1.4–7.7)
Neutrophils Relative %: 51.2 % (ref 43.0–77.0)
Platelets: 133 10*3/uL — ABNORMAL LOW (ref 150.0–400.0)
RBC: 4.75 Mil/uL (ref 4.22–5.81)
RDW: 13.4 % (ref 11.5–15.5)
WBC: 4.6 10*3/uL (ref 4.0–10.5)

## 2019-05-29 LAB — LIPID PANEL
Cholesterol: 248 mg/dL — ABNORMAL HIGH (ref 0–200)
HDL: 67.4 mg/dL (ref >39.00)
LDL Cholesterol: 160 mg/dL — ABNORMAL HIGH (ref 0–99)
NonHDL: 180.33
Total CHOL/HDL Ratio: 4
Triglycerides: 102 mg/dL (ref 0.0–149.0)
VLDL: 20.4 mg/dL (ref 0.0–40.0)

## 2019-05-29 LAB — HEMOGLOBIN A1C: Hgb A1c MFr Bld: 5.2 % (ref 4.6–6.5)

## 2019-05-29 LAB — TSH: TSH: 3.75 u[IU]/mL (ref 0.35–4.50)

## 2019-05-29 LAB — PSA: PSA: 16.37 ng/mL — ABNORMAL HIGH (ref 0.10–4.00)

## 2019-05-29 MED ORDER — TAMSULOSIN HCL 0.4 MG PO CAPS: 0.4000 mg | ORAL_CAPSULE | Freq: Every day | ORAL | 3 refills | Status: DC

## 2019-05-29 NOTE — Progress Notes (Signed)
Subjective:    Patient ID: Benjamin [name], male    DOB: [birth year], 80 y.o.   MRN: [id number]  HPI Here to follow up on issues. Most of our conversation today involved Benjamin [name] describing the tough situation he has at home, with taking care of his invalid wife. She is in a hospice program, and she has already outlived everyone's expectations. Benjamin [name] is feeling his age and he doesn't know how much longer he can keep working. His anxiety still troubles him, but he gets by with Xanax and with drinking some vodka every night. He never drinks during the day. His BP is stable. He had been getting up 3-4 times a night to urinate until he tried some Flomax that he was given by his wife's doctor. This helps a lot , and he wants to stay on it.    Review of Systems  Constitutional: Negative.   HENT: Negative.   Eyes: Negative.   Respiratory: Negative.   Cardiovascular: Negative.   Gastrointestinal: Negative.   Genitourinary: Negative.   Musculoskeletal: Negative.   Skin: Negative.   Neurological: Negative.   Psychiatric/Behavioral: Positive for sleep disturbance. The patient is nervous/anxious.        Objective:   Physical Exam Constitutional:      General: He is not in acute distress.    Appearance: He is well-developed. He is not diaphoretic.  HENT:     Head: Normocephalic and atraumatic.     Right Ear: External ear normal.     Left Ear: External ear normal.     Nose: Nose normal.     Mouth/Throat:     Pharynx: No oropharyngeal exudate.  Eyes:     General: No scleral icterus.       Right eye: No discharge.        Left eye: No discharge.     Conjunctiva/sclera: Conjunctivae normal.     Pupils: Pupils are equal, round, and reactive to light.  Neck:     Thyroid: No thyromegaly.     Vascular: No JVD.     Trachea: No tracheal deviation.  Cardiovascular:     Rate and Rhythm: Normal rate and regular rhythm.     Heart sounds: Normal heart sounds. No murmur. No friction rub. No gallop.     Pulmonary:     Effort: Pulmonary effort is normal. No respiratory distress.     Breath sounds: Normal breath sounds. No wheezing or rales.  Chest:     Chest wall: No tenderness.  Abdominal:     General: Bowel sounds are normal. There is no distension.     Palpations: Abdomen is soft. There is no mass.     Tenderness: There is no abdominal tenderness. There is no guarding or rebound.     Comments: He has his usual non-tender easily reducible umbilical hernia   Genitourinary:    Penis: Normal. No tenderness.      Testes: Normal.     Prostate: Normal.     Rectum: Normal. Guaiac result negative.  Musculoskeletal:        General: No tenderness. Normal range of motion.     Cervical back: Neck supple.  Lymphadenopathy:     Cervical: No cervical adenopathy.  Skin:    General: Skin is warm and dry.     Coloration: Skin is not pale.     Findings: No erythema or rash.  Neurological:     Mental Status: He is alert and oriented to person, place, and time.  Cranial Nerves: No cranial nerve deficit.     Motor: No abnormal muscle tone.     Coordination: Coordination normal.     Deep Tendon Reflexes: Reflexes are normal and symmetric. Reflexes normal.  Psychiatric:        Behavior: Behavior normal.        Thought Content: Thought content normal.        Judgment: Judgment normal.           Assessment & Plan:  His HTN is stable. His insomnia and anxiety are stable. The GERD I stable. For the [facility], we will refill Flomax. Get fasting labs to check lipids, an A1c, etc.  Benjamin Penna, MD

## 2019-06-02 ENCOUNTER — Encounter: Payer: Medicare Other | Admitting: Family Medicine

## 2019-06-16 DIAGNOSIS — Z9889 Other specified postprocedural states: Secondary | ICD-10-CM | POA: Diagnosis not present

## 2019-06-16 DIAGNOSIS — H353132 Nonexudative age-related macular degeneration, bilateral, intermediate dry stage: Secondary | ICD-10-CM | POA: Diagnosis not present

## 2019-06-16 DIAGNOSIS — H25812 Combined forms of age-related cataract, left eye: Secondary | ICD-10-CM | POA: Diagnosis not present

## 2019-06-16 DIAGNOSIS — H25811 Combined forms of age-related cataract, right eye: Secondary | ICD-10-CM | POA: Diagnosis not present

## 2019-06-22 ENCOUNTER — Encounter: Payer: Self-pay | Admitting: Family Medicine

## 2019-06-22 ENCOUNTER — Other Ambulatory Visit: Payer: Self-pay | Admitting: Family Medicine

## 2019-06-22 NOTE — Telephone Encounter (Signed)
Pt has been scheduled.  °

## 2019-06-23 ENCOUNTER — Other Ambulatory Visit: Payer: Self-pay

## 2019-06-23 ENCOUNTER — Encounter: Payer: Self-pay | Admitting: Family Medicine

## 2019-06-23 ENCOUNTER — Ambulatory Visit (INDEPENDENT_AMBULATORY_CARE_PROVIDER_SITE_OTHER): Payer: Medicare Other | Admitting: Family Medicine

## 2019-06-23 VITALS — BP 140/72 | HR 61 | Temp 97.8°F | Wt 232.0 lb

## 2019-06-23 DIAGNOSIS — G4700 Insomnia, unspecified: Secondary | ICD-10-CM | POA: Diagnosis not present

## 2019-06-23 DIAGNOSIS — I10 Essential (primary) hypertension: Secondary | ICD-10-CM | POA: Diagnosis not present

## 2019-06-23 DIAGNOSIS — M25471 Effusion, right ankle: Secondary | ICD-10-CM | POA: Diagnosis not present

## 2019-06-23 DIAGNOSIS — M25472 Effusion, left ankle: Secondary | ICD-10-CM

## 2019-06-23 MED ORDER — LISINOPRIL-HYDROCHLOROTHIAZIDE 10-12.5 MG PO TABS: 1.0000 | ORAL_TABLET | Freq: Every day | ORAL | 3 refills | Status: DC

## 2019-06-23 MED ORDER — ZOLPIDEM TARTRATE ER 6.25 MG PO TBCR: 6.2500 mg | EXTENDED_RELEASE_TABLET | Freq: Every evening | ORAL | 2 refills | Status: DC | PRN

## 2019-06-23 MED ORDER — METOPROLOL SUCCINATE ER 50 MG PO TB24: 50.0000 mg | ORAL_TABLET | Freq: Every day | ORAL | 3 refills | Status: DC

## 2019-06-23 NOTE — Progress Notes (Signed)
   Subjective:    Patient ID: Benjamin [name], male    DOB: Jan 04, 1941, 79 y.o.   MRN: [id number]  HPI Here for several issues. First he still has trouble sleeping at night. Xanax does not help. He took Zolpidem for awhile several years ago and it did help him sleep, but it seemed to wear off quickly after a few hours. Also his BP at home has been running a little high, often in the 123456 systolic. His ankles have also been swelling a lot, though they are not painful. No SOB. His renal function ws normal last month.    Review of Systems  Constitutional: Negative.   Respiratory: Negative.   Cardiovascular: Positive for leg swelling. Negative for chest pain and palpitations.  Psychiatric/Behavioral: Positive for sleep disturbance.       Objective:   Physical Exam Constitutional:      Appearance: Normal appearance.  Cardiovascular:     Rate and Rhythm: Normal rate and regular rhythm.     Pulses: Normal pulses.     Heart sounds: Normal heart sounds.  Pulmonary:     Effort: Pulmonary effort is normal.     Breath sounds: Normal breath sounds.  Musculoskeletal:     Comments: 2+ edema in both ankles   Neurological:     General: No focal deficit present.     Mental Status: He is alert and oriented to person, place, and time.           Assessment & Plan:  I think his ankle edema is an effect of the Amlodipine, so we will stop this. Instead we will try Metoprolol succinate 50 mg daily and add HCTZ 12.5 mg to the Lisinopril. For sleep he will try Zolpidem CR 6.25 mg qhs. Recheck in 3-4 weeks.  Alysia Penna, MD

## 2019-06-23 NOTE — Telephone Encounter (Signed)
He was seen today

## 2019-07-14 ENCOUNTER — Encounter: Payer: Self-pay | Admitting: Family Medicine

## 2019-07-14 MED ORDER — TEMAZEPAM 30 MG PO CAPS: 30.0000 mg | ORAL_CAPSULE | Freq: Every evening | ORAL | 5 refills | Status: DC | PRN

## 2019-07-14 NOTE — Telephone Encounter (Signed)
Pt is wondering if this can be called in shortly. Informed pt that PCP is seeing pt in office and when he has a moment he will try to get this sent in. Pt is requesting a call back.  Pt can be reached at [phone]

## 2019-07-14 NOTE — Telephone Encounter (Signed)
Please advise. Lorazepam is not on the patients med list.

## 2019-07-14 NOTE — Telephone Encounter (Signed)
Done

## 2019-07-17 ENCOUNTER — Encounter: Payer: Self-pay | Admitting: Family Medicine

## 2019-07-17 MED ORDER — MIRTAZAPINE 15 MG PO TABS: 15.0000 mg | ORAL_TABLET | Freq: Every day | ORAL | 2 refills | Status: DC

## 2019-07-17 NOTE — Telephone Encounter (Signed)
I sent in a rx for him to try a brand new medication called Mirtazepine (or Remeron). I want him to take one Mirtazepine and one Temazepam every night at bedtime. Give this 3-4 weeks and let me know if it helps

## 2019-09-08 ENCOUNTER — Other Ambulatory Visit: Payer: Self-pay | Admitting: Family Medicine

## 2019-09-08 NOTE — Telephone Encounter (Signed)
Last filled 02/20/2019 Last OV 06/23/2019  Ok to fill?

## 2019-09-21 DIAGNOSIS — Z20822 Contact with and (suspected) exposure to covid-19: Secondary | ICD-10-CM | POA: Diagnosis not present

## 2019-09-21 DIAGNOSIS — R509 Fever, unspecified: Secondary | ICD-10-CM | POA: Diagnosis not present

## 2019-09-21 DIAGNOSIS — J029 Acute pharyngitis, unspecified: Secondary | ICD-10-CM | POA: Diagnosis not present

## 2019-09-24 ENCOUNTER — Encounter: Payer: Self-pay | Admitting: Family Medicine

## 2019-09-26 MED ORDER — METOPROLOL SUCCINATE ER 100 MG PO TB24
100.0000 mg | ORAL_TABLET | Freq: Every day | ORAL | 3 refills | Status: DC
Start: 2019-09-26 — End: 2020-05-29

## 2019-09-26 NOTE — Telephone Encounter (Signed)
Change the Metoprolol succinate to 100 mg daily. Call in #[phone] with 3 rf

## 2019-10-11 ENCOUNTER — Other Ambulatory Visit: Payer: Self-pay | Admitting: Family Medicine

## 2019-11-06 ENCOUNTER — Other Ambulatory Visit: Payer: Self-pay

## 2019-11-06 ENCOUNTER — Encounter: Payer: Self-pay | Admitting: Family Medicine

## 2019-11-06 ENCOUNTER — Ambulatory Visit (INDEPENDENT_AMBULATORY_CARE_PROVIDER_SITE_OTHER): Payer: Medicare Other | Admitting: Family Medicine

## 2019-11-06 VITALS — BP 134/58 | HR 67 | Temp 97.8°F | Resp 16 | Ht 71.75 in | Wt 231.0 lb

## 2019-11-06 DIAGNOSIS — K219 Gastro-esophageal reflux disease without esophagitis: Secondary | ICD-10-CM

## 2019-11-06 DIAGNOSIS — R05 Cough: Secondary | ICD-10-CM | POA: Diagnosis not present

## 2019-11-06 DIAGNOSIS — R059 Cough, unspecified: Secondary | ICD-10-CM

## 2019-11-06 MED ORDER — FAMOTIDINE 40 MG PO TABS: 40.0000 mg | ORAL_TABLET | Freq: Every day | ORAL | 0 refills | Status: DC

## 2019-11-06 MED ORDER — BENZONATATE 100 MG PO CAPS: 100.0000 mg | ORAL_CAPSULE | Freq: Three times a day (TID) | ORAL | 0 refills | Status: AC

## 2019-11-06 NOTE — Patient Instructions (Addendum)
A few things to remember from today's visit:   Gastroesophageal reflux disease, unspecified whether esophagitis present - Plan: famotidine (PEPCID) 40 MG tablet  Cough - Plan: DG Chest 2 View  If you need refills please call your pharmacy. Do not use My Chart to request refills or for acute issues that need immediate attention.   Pepcid 40 mg added today. Raise head of bed. Decrease alcohol intake. No change sin Omeprazole. Monitor for symptoms. Lisinopril can aggravate cough,so we may need to stop it if cough is not better.  Please be sure medication list is accurate. If a new problem present, please set up appointment sooner than planned today.

## 2019-11-06 NOTE — Progress Notes (Addendum)
Acute visit: Chief Complaint  Patient presents with  . Gastroesophageal Reflux   HPI: [name] is a 79 y.o. male with history of IBS, hypertension, GERD, insomnia, depression, and anxiety here today complaining of persistent cough that started 4 days ago and he attributes to worsening GERD.  He ate pizza for dinner and forgot to take his omeprazole 40 mg, later that night he started with "terible" throat and upper mid chest pain, burning at times and "gagling"when lying down.  Next day minor sore throat and now resolved. He is back to Omeprazole 40 mg daily. Symptoms gradually improving except for cough. Productive cough with thick light green sputum. Today he has less sputum. Negative for hemoptysis.  If he takes Omeprazole higher dose he gets GI problems, "gas." Symptoms seem to be exacerbated by alcohol intake and taking.  He has tried OTC cough medication.  No fever,chills, dysphagia,SOB,wheezing,N/V,or changes in bowel movements. No orthopnea or PND. Negative for anosmia or ageusia. Today he had his COVID 19 booster.   He drinks 3 shots of vodka or scotch every night before going to bed. Former smoker. Colonoscopy in 07/2018. He has not had EGD.  Review of Systems  Constitutional: Positive for fatigue. Negative for appetite change and unexpected weight change.  HENT: Negative for mouth sores and nosebleeds.   Cardiovascular: Negative for palpitations and leg swelling.  Genitourinary: Negative for decreased urine volume, dysuria and hematuria.  Musculoskeletal: Negative for gait problem and myalgias.  Skin: Negative for pallor and rash.  Neurological: Negative for syncope and weakness.  Psychiatric/Behavioral: Negative for confusion. The patient is not nervous/anxious.   Rest see pertinent positives and negatives per HPI.  Current Outpatient Medications on File Prior to Visit  Medication Sig Dispense Refill  . alprazolam (XANAX) 2 MG tablet TAKE 1 TABLET  EVERY 6 HOURS AS NEEDED FOR ANXIETY. 120 tablet 5  . lisinopril-hydrochlorothiazide (ZESTORETIC) 10-12.5 MG tablet Take 1 tablet by mouth daily. 30 tablet 3  . metoprolol succinate (TOPROL-XL) 100 MG 24 hr tablet Take 1 tablet (100 mg total) by mouth daily. Take with or immediately following a meal. 90 tablet 3  . mirtazapine (REMERON) 15 MG tablet TAKE ONE TABLET AT BEDTIME. 30 tablet 0  . naproxen sodium (ALEVE) 220 MG tablet Take 220 mg by mouth.    Marland Kitchen omeprazole (PRILOSEC) 40 MG capsule TAKE (1) CAPSULE TWICE DAILY. 180 capsule 0  . polyethylene glycol (MIRALAX / GLYCOLAX) packet Take 17 g by mouth 2 (two) times daily. 14 each 0  . tamsulosin (FLOMAX) 0.4 MG CAPS capsule Take 1 capsule (0.4 mg total) by mouth daily. 90 capsule 3  . temazepam (RESTORIL) 30 MG capsule Take 1 capsule (30 mg total) by mouth at bedtime as needed for sleep. 30 capsule 5   No current facility-administered medications on file prior to visit.   Past Medical History:  Diagnosis Date  . Allergy   . Anxiety   . Arthritis   . BPH (benign prostatic hyperplasia)    with elevated PSA, sees Dr. Diona Fanti, benign  . Cataract   . Elevated PSA   . GERD (gastroesophageal reflux disease)   . Hyperlipidemia   . Hypertension   . Insomnia   . Platelets decreased ([place])   . Pneumonia    walking PNA  . Pre-diabetes    No meds   Allergies  Allergen Reactions  . Codeine     hallucinations  . Diclofenac     Easy bruising and GI bleeding   .  Norco [Hydrocodone-Acetaminophen]     Hallucinations     Social History   Socioeconomic History  . Marital status: Married    Spouse name: Not on file  . Number of children: Not on file  . Years of education: Not on file  . Highest education level: Not on file  Occupational History  . Not on file  Tobacco Use  . Smoking status: Former Smoker    Packs/day: 1.50    Types: Cigarettes    Quit date: 02/09/1974    Years since quitting: 45.7  . Smokeless tobacco: Never Used    . Tobacco comment: quit over 75 yo, absolutely smoked more than 100 cig; referred for [employer]   Vaping Use  . Vaping Use: Never used  Substance and Sexual Activity  . Alcohol use: Yes    Alcohol/week: 7.0 standard drinks    Types: 7 Shots of liquor per week    Comment: daily  . Drug use: Yes    Types: Marijuana    Comment: occas uses 1 time every other month - this past weekend  . Sexual activity: Not Currently  Other Topics Concern  . 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 Difficulty of Paying Living Expenses: Not on file  Food Insecurity:   . Worried About Charity fundraiser in the Last Year: Not on file  . Ran Out of Food in the Last Year: Not on file  Transportation Needs:   . Lack of Transportation (Medical): Not on file  . Lack of Transportation (Non-Medical): Not on file  Physical Activity:   . Days of Exercise per Week: Not on file  . Minutes of Exercise per Session: Not on file  Stress:   . Feeling of Stress : Not on file  Social Connections:   . Frequency of Communication with Friends and Family: Not on file  . Frequency of Social Gatherings with Friends and Family: Not on file  . Attends Religious Services: Not on file  . Active Member of Clubs or Organizations: Not on file  . Attends Archivist Meetings: Not on file  . Marital Status: Not on file    Vitals:   11/06/19 1625  BP: (!) 134/58  Pulse: 67  Resp: 16  Temp: 97.8 F (36.6 C)  SpO2: 98%   Body mass index is 31.55 kg/m.  Physical Exam Vitals and nursing note reviewed.  Constitutional:      General: He is not in acute distress.    Appearance: He is well-developed and well-groomed.  HENT:     Head: Normocephalic and atraumatic.     Mouth/Throat:     Mouth: Mucous membranes are moist.     Pharynx:  Oropharynx is clear.  Eyes:     Conjunctiva/sclera: Conjunctivae normal.  Cardiovascular:     Rate and Rhythm: Normal rate and regular rhythm.     Heart sounds: No murmur heard.      Comments: Trace pitting LE edema, bilateral. Pulmonary:     Effort: Pulmonary effort is normal. No respiratory distress.     Breath sounds: Normal breath sounds.     Comments: Non productive cough episodes during visit. Abdominal:     Palpations: Abdomen is soft. There is no hepatomegaly or mass.  Tenderness: There is no abdominal tenderness.  Skin:    General: Skin is warm.     Findings: No erythema or rash.  Neurological:     General: No focal deficit present.     Mental Status: He is alert and oriented to person, place, and time.     Gait: Gait normal.  Psychiatric:        Mood and Affect: Mood and affect normal.   ASSESSMENT AND PLAN:  Benjamin [name] was seen today for gastroesophageal reflux.  Diagnoses and all orders for this visit: Orders Placed This Encounter  Procedures  . DG Chest 2 View    Gastroesophageal reflux disease, unspecified whether esophagitis present Reported symptoms suggest GERD related. I do not think imaging or further work-up is needed at this time. Strongly recommend decreasing alcohol intake and do not eat or drink at least 3-4 hours before going to bed. He prefers not to change to a different PPI because Omeprazole has helped. Pepcid 40 mg at bedtime. GERD precautions. F/U in 2-3 weeks with PCP.  -     famotidine (PEPCID) 40 MG tablet; Take 1 tablet (40 mg total) by mouth at bedtime.  Cough Possible etiologies discussed. Most likely GERD related. Because episodes of "gargling" I think CXR is warrant to evaluate for aspiration pneumonia. Instructed about warning signs.  -     benzonatate (TESSALON) 100 MG capsule; Take 1 capsule (100 mg total) by mouth 3 (three) times daily for 10 days.   Return in about 2 weeks (around 11/20/2019) for cough with  pcp.   Benjamin Appling G. Martinique, MD  [facility]. [place] office.  A few things to remember from today's visit:  Gastroesophageal reflux disease, unspecified whether esophagitis present - Plan: famotidine (PEPCID) 40 MG tablet  Cough - Plan: DG Chest 2 View  If you need refills please call your pharmacy. Do not use My Chart to request refills or for acute issues that need immediate attention.   Pepcid 40 mg added today. Raise head of bed. Decrease alcohol intake. No change sin Omeprazole. Monitor for symptoms. Lisinopril can aggravate cough,so we may need to stop it if cough is not better.  Please be sure medication list is accurate. If a new problem present, please set up appointment sooner than planned today.

## 2019-11-07 ENCOUNTER — Ambulatory Visit (INDEPENDENT_AMBULATORY_CARE_PROVIDER_SITE_OTHER)
Admission: RE | Admit: 2019-11-07 | Discharge: 2019-11-07 | Disposition: A | Discharge status: Home / Self Care | Payer: Medicare Other | Source: Ambulatory Visit | Attending: Family Medicine | Admitting: Family Medicine

## 2019-11-07 DIAGNOSIS — R059 Cough, unspecified: Secondary | ICD-10-CM

## 2019-11-07 DIAGNOSIS — R05 Cough: Secondary | ICD-10-CM | POA: Diagnosis not present

## 2019-11-10 ENCOUNTER — Other Ambulatory Visit: Payer: Self-pay | Admitting: Family Medicine

## 2019-11-16 NOTE — Progress Notes (Signed)
Patient scheduled for 11/20/2019 at 3:30 PM with Dr. Sarajane Jews

## 2019-11-17 ENCOUNTER — Ambulatory Visit: Payer: Medicare Other | Admitting: Family Medicine

## 2019-11-20 ENCOUNTER — Ambulatory Visit: Payer: Medicare Other | Admitting: Family Medicine

## 2019-11-27 ENCOUNTER — Ambulatory Visit (INDEPENDENT_AMBULATORY_CARE_PROVIDER_SITE_OTHER): Payer: Medicare Other | Admitting: Family Medicine

## 2019-11-27 ENCOUNTER — Other Ambulatory Visit: Payer: Self-pay

## 2019-11-27 ENCOUNTER — Encounter: Payer: Self-pay | Admitting: Family Medicine

## 2019-11-27 VITALS — BP 160/78 | HR 73 | Temp 98.2°F | Ht 71.75 in | Wt 231.6 lb

## 2019-11-27 DIAGNOSIS — K219 Gastro-esophageal reflux disease without esophagitis: Secondary | ICD-10-CM | POA: Diagnosis not present

## 2019-11-27 MED ORDER — LISINOPRIL-HYDROCHLOROTHIAZIDE 10-12.5 MG PO TABS
1.0000 | ORAL_TABLET | Freq: Every day | ORAL | 3 refills | Status: DC
Start: 2019-11-27 — End: 2021-02-21

## 2019-11-27 MED ORDER — MIRTAZAPINE 15 MG PO TABS
15.0000 mg | ORAL_TABLET | Freq: Every day | ORAL | 3 refills | Status: DC
Start: 2019-11-27 — End: 2020-05-29

## 2019-11-27 MED ORDER — FAMOTIDINE 40 MG PO TABS: 40.0000 mg | ORAL_TABLET | Freq: Every morning | ORAL | 3 refills | Status: DC

## 2019-11-27 MED ORDER — OMEPRAZOLE 40 MG PO CPDR
40.0000 mg | DELAYED_RELEASE_CAPSULE | Freq: Every day | ORAL | 0 refills | Status: DC
Start: 2019-11-27 — End: 2020-02-12

## 2019-11-27 NOTE — Progress Notes (Signed)
   Subjective:    Patient ID: Benjamin [name], male    DOB: 1940-06-11, 79 y.o.   MRN: [id number]  HPI Here to follow up on GERD and other issues. First he tells me that his wife, who had been suffering with Parkinson's disease for years, passed away 6 weeks ago. He says he has been dealing with this as well as could be expected. He saw Dr. Martinique last month after a bad episode of reflux during the night which caused him to choke, t cough for several weeks, and to have a hoarse voice for a week. These symptoms have since resolved. He had been taking Omeprazole every night but he had skipped taking it that night because he was out of town and he had forgotten it. Since then he has been taking it again at bedtime. He still has occasional heartburn during the day as well, and he uses Pepto-Bismol for that. No trouble swallowing. He had tried taking Omeprazole BID but this gave him gas.    Review of Systems  Constitutional: Negative.   Respiratory: Negative.   Cardiovascular: Negative.   Gastrointestinal: Positive for abdominal pain. Negative for abdominal distention, anal bleeding, blood in stool, constipation, diarrhea, nausea, rectal pain and vomiting.       Objective:   Physical Exam Constitutional:      Appearance: Normal appearance.  Cardiovascular:     Rate and Rhythm: Normal rate and regular rhythm.     Pulses: Normal pulses.     Heart sounds: Normal heart sounds.  Pulmonary:     Effort: Pulmonary effort is normal.     Breath sounds: Normal breath sounds.  Abdominal:     General: Abdomen is flat. Bowel sounds are normal. There is no distension.     Palpations: Abdomen is soft. There is no mass.     Tenderness: There is no abdominal tenderness. There is no guarding or rebound.     Hernia: No hernia is present.  Neurological:     Mental Status: He is alert.           Assessment & Plan:  GERD. He will take 40 mg of Famotidine in the mornings and 40 mg of Omeprazole at night.  Recheck prn.  Alysia Penna, MD

## 2019-12-08 ENCOUNTER — Ambulatory Visit (INDEPENDENT_AMBULATORY_CARE_PROVIDER_SITE_OTHER): Payer: Medicare Other

## 2019-12-08 DIAGNOSIS — Z0000 Encounter for general adult medical examination without abnormal findings: Secondary | ICD-10-CM

## 2019-12-08 NOTE — Progress Notes (Signed)
Subjective:   Benjamin [name] is a 79 y.o. male who presents for Medicare Annual/Subsequent preventive examination.  I connected with Benjamin [name] today by telephone and verified that I am speaking with the correct person using two identifiers. Location patient: home Location provider: work [name], provider.   I discussed the limitations, risks, security and privacy concerns of performing an evaluation and management service by telephone and the availability of in person appointments. I also discussed with the patient that there may be a patient responsible charge related to this service. The patient expressed understanding and verbally consented to this telephonic visit.    Interactive audio and video telecommunications were attempted between this provider and patient, however failed, due to patient having technical difficulties OR patient did not have access to video capability.  We continued and completed visit with audio only.     Review of Systems    N/A Cardiac Risk Factors include: advanced age (>32men, >33 women);male gender;hypertension;dyslipidemia     Objective:    Today's Vitals   There is no height or weight on file to calculate BMI.  Advanced Directives 12/08/2019 01/05/2017 01/05/2017 12/29/2016 04/24/2016 06/10/2015 07/16/2014  Does Patient Have a Medical Advance Directive? Yes Yes Yes Yes Yes Yes Yes  Type of Paramedic of [place];Living will Living will;Healthcare Power of Attorney Living will;Healthcare Power of Attorney Living will;Healthcare Power of [place];Living will Living will  Does patient want to make changes to medical advance directive? No - Patient declined No - Patient declined No - Patient declined No - Patient declined - - No - Patient declined  Copy of [place] in Chart? Yes - validated most recent copy scanned in chart (See row  information) No - copy requested No - copy requested No - copy requested - - No - copy requested    Current Medications (verified) Outpatient Encounter Medications as of 12/08/2019  Medication Sig  . alprazolam (XANAX) 2 MG tablet TAKE 1 TABLET EVERY 6 HOURS AS NEEDED FOR ANXIETY.  . famotidine (PEPCID) 40 MG tablet Take 1 tablet (40 mg total) by mouth in the morning.  . latanoprost (XALATAN) 0.005 % ophthalmic solution SMARTSIG:In Eye(s)  . lisinopril-hydrochlorothiazide (ZESTORETIC) 10-12.5 MG tablet Take 1 tablet by mouth daily.  . metoprolol succinate (TOPROL-XL) 100 MG 24 hr tablet Take 1 tablet (100 mg total) by mouth daily. Take with or immediately following a meal.  . mirtazapine (REMERON) 15 MG tablet Take 1 tablet (15 mg total) by mouth at bedtime.  . Multiple Vitamins-Minerals (PRESERVISION AREDS PO) Take by mouth.  . naproxen sodium (ALEVE) 220 MG tablet Take 220 mg by mouth.  Marland Kitchen omeprazole (PRILOSEC) 40 MG capsule Take 1 capsule (40 mg total) by mouth at bedtime.  . polyethylene glycol (MIRALAX / GLYCOLAX) packet Take 17 g by mouth 2 (two) times daily.  . tamsulosin (FLOMAX) 0.4 MG CAPS capsule Take 1 capsule (0.4 mg total) by mouth daily.  . temazepam (RESTORIL) 30 MG capsule Take 1 capsule (30 mg total) by mouth at bedtime as needed for sleep.   No facility-administered encounter medications on file as of 12/08/2019.    Allergies (verified) Codeine, Diclofenac, and Norco [hydrocodone-acetaminophen]   History: Past Medical History:  Diagnosis Date  . Allergy   . Anxiety   . Arthritis   . BPH (benign prostatic hyperplasia)    with elevated PSA, sees Dr. Diona Fanti, benign  . Cataract   .  Elevated PSA   . GERD (gastroesophageal reflux disease)   . Hyperlipidemia   . Hypertension   . Insomnia   . Platelets decreased ([place])   . Pneumonia    walking PNA  . Pre-diabetes    No meds   Past Surgical History:  Procedure Laterality Date  . CHOLECYSTECTOMY N/A 01/10/2014     Procedure: LAPAROSCOPIC CHOLECYSTECTOMY WITH INTRAOPERATIVE CHOLANGIOGRAM;  Surgeon: Pedro Earls, MD;  Location: [facility];  Service: General;  Laterality: N/A;  . COLONOSCOPY  08/03/2018   per Dr. Carlean Purl, adenomatous polyps, no repeats needed   . JOINT REPLACEMENT     Right hip arthroplasty Dr. Alvan Dame 01/05/17   . left knee arthroscopy     . LIPOSUCTION  1987  . prostate biopsies in 2003 and again 05/2008     per Dr. Diona Fanti. benign  . REFRACTIVE SURGERY    . repair  of large umblicial hernia     per Dr. Kaylyn Lim on 01/10/10  . right knee arthroscopy    . TONSILLECTOMY    . 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 Alzheimer's disease Other   . Colon cancer Neg Hx   . Esophageal cancer Neg Hx   . Pancreatic cancer Neg Hx   . Prostate cancer Neg Hx   . Rectal cancer Neg Hx   . Stomach cancer Neg Hx    Social History   Socioeconomic History  . Marital status: Widowed    Spouse name: Not on file  . Number of children: Not on file  . Years of education: Not on file  . Highest education level: Not on file  Occupational History  . Not on file  Tobacco Use  . Smoking status: Former Smoker    Packs/day: 1.50    Types: Cigarettes    Quit date: 02/09/1974    Years since quitting: 45.8  . Smokeless tobacco: Never Used  . Tobacco comment: quit over 26 yo, absolutely smoked more than 100 cig; referred for AAA   Vaping Use  . Vaping Use: Never used  Substance and Sexual Activity  . Alcohol use: Yes    Alcohol/week: 7.0 standard drinks    Types: 7 Shots of liquor per week    Comment: daily  .  Drug use: Yes    Types: Marijuana    Comment: occas uses 1 time every other month - this past weekend  . Sexual activity: Not Currently  Other Topics Concern  . 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 Difficulty of Paying Living Expenses: Not hard at all  Food Insecurity: No Food Insecurity  . Worried About Charity fundraiser in the Last Year: Never true  . Ran Out of Food in the Last Year: Never true  Transportation Needs: No Transportation Needs  . Lack of Transportation (Medical): No  . Lack of Transportation (  Non-Medical): No  Physical Activity: Inactive  . Days of Exercise per Week: 0 days  . Minutes of Exercise per Session: 0 min  Stress: No Stress Concern Present  . Feeling of Stress : Not at all  Social Connections: Socially Isolated  . Frequency of Communication with Friends and Family: More than three times a week  . Frequency of Social Gatherings with Friends and Family: Twice a week  . Attends Religious Services: Never  . Active Member of Clubs or Organizations: No  . Attends Archivist Meetings: Never  . Marital Status: Widowed    Tobacco Counseling Counseling given: Not Answered Comment: quit over 82 yo, absolutely smoked more than 100 cig; referred for AAA    Clinical Intake:  Pre-visit preparation completed: Yes  Pain : No/denies pain     Nutritional Risks: Nausea/ vomitting/ diarrhea (GI upset) Diabetes: No  How often do you need to have someone help you when you read instructions, pamphlets, or other written materials from your doctor or pharmacy?: 1 - Never What is the last grade level you completed in school?: college  Diabetic?No    Interpreter Needed?: No  Information entered by :: [place] of Daily Living In your present  state of health, do you have any difficulty performing the following activities: 12/08/2019  Hearing? Y  Comment Has some issues with hearing. States if has a lot of background noise has difficulties hearing  Vision? N  Difficulty concentrating or making decisions? Y  Comment Has difficulties remembering names, and words  Walking or climbing stairs? N  Dressing or bathing? N  Doing errands, shopping? N  Preparing Food and eating ? N  Using the Toilet? N  In the past six months, have you accidently leaked urine? N  Do you have problems with loss of bowel control? N  Managing your Medications? N  Managing your Finances? N  Housekeeping or managing your Housekeeping? N  Some recent data might be hidden    Patient Care Team: Laurey Morale, MD as PCP - General  Indicate any recent Medical Services you may have received from other than [facility] providers in the past year (date may be approximate).     Assessment:   This is a routine wellness examination for [place].  Hearing/Vision screen  Hearing Screening   125Hz  250Hz  500Hz  1000Hz  2000Hz  3000Hz  4000Hz  6000Hz  8000Hz   Right ear:           Left ear:           Vision Screening Comments: Patient gets eyes examined yearly    Dietary issues and exercise activities discussed: Current Exercise Habits: The patient does not participate in regular exercise at present  Goals    . Exercise 150 min/wk Moderate Activity    . Patient     Discussed learning about the long term care system; Recommended seeking an expert in long term care for information on Medicaid law and resources for those who are disabled.       Depression Screen PHQ 2/9 Scores 12/08/2019 11/27/2019 11/27/2019 11/12/2017 04/24/2016 08/02/2014  PHQ - 2 Score 1 1 1 1  0 0  PHQ- 9 Score 2 1 - - - -  Exception Documentation - Medical reason Medical reason - - -    Fall Risk Fall Risk  12/08/2019 11/27/2019 11/12/2017 04/24/2016 04/24/2016  Falls in the past year? 0 0 No No No   Number falls in past yr: 0 0 - - -  Injury  with Fall? 0 0 - - -  Risk for fall due to : No Fall Risks - - - -  Follow up Falls evaluation completed;Falls prevention discussed - - - -    Any stairs in or around the home? Yes  If so, are there any without handrails? No  Home free of loose throw rugs in walkways, pet beds, electrical cords, etc? Yes  Adequate lighting in your home to reduce risk of falls? Yes   ASSISTIVE DEVICES UTILIZED TO PREVENT FALLS:  Life alert? No  Use of a cane, walker or w/c? No  Grab bars in the bathroom? Yes  Shower chair or bench in shower? Yes  Elevated toilet seat or a handicapped toilet? No     Cognitive Function:  Patient declined cognitive screening       Immunizations Immunization History  Administered Date(s) Administered  . Fluad Quad(high Dose 65+) 10/11/2018  . Influenza Whole 03/04/2009  . Influenza, High Dose Seasonal PF 12/30/2016, 11/12/2017  . Influenza,inj,Quad PF,6+ Mos 11/06/2013  . Influenza-Unspecified 11/19/2014, 11/10/2015, 11/10/2015  . PFIZER SARS-COV-2 Vaccination 02/23/2019, 03/16/2019, 11/06/2019  . PPD Test 07/18/2014  . Pneumococcal Conjugate-13 11/06/2013  . Pneumococcal Polysaccharide-23 03/04/2009  . Zoster Recombinat (Shingrix) 01/28/2017    TDAP status: Due, Education has been provided regarding the importance of this vaccine. Advised may receive this vaccine at local pharmacy or Health Dept. Aware to provide a copy of the vaccination record if obtained from local pharmacy or Health Dept. Verbalized acceptance and understanding. Flu Vaccine status: Declined, Education has been provided regarding the importance of this vaccine but patient still declined. Advised may receive this vaccine at local pharmacy or Health Dept. Aware to provide a copy of the vaccination record if obtained from local pharmacy or Health Dept. Verbalized acceptance and understanding. Pneumococcal vaccine status: Up to date Covid-19 vaccine  status: Completed vaccines  Qualifies for Shingles Vaccine? Yes   Zostavax completed No   Shingrix Completed?: Yes  Screening Tests Health Maintenance  Topic Date Due  . Hepatitis C Screening  Never done  . FOOT EXAM  Never done  . OPHTHALMOLOGY EXAM  Never done  . TETANUS/TDAP  Never done  . INFLUENZA VACCINE  09/10/2019  . HEMOGLOBIN A1C  11/28/2019  . COLONOSCOPY  08/02/2021  . COVID-19 Vaccine  Completed  . PNA vac Low Risk Adult  Completed    Health Maintenance  Health Maintenance Due  Topic Date Due  . Hepatitis C Screening  Never done  . FOOT EXAM  Never done  . OPHTHALMOLOGY EXAM  Never done  . TETANUS/TDAP  Never done  . INFLUENZA VACCINE  09/10/2019  . HEMOGLOBIN A1C  11/28/2019    Colorectal cancer screening: Completed 08/03/2018. Repeat every 3 years  Lung Cancer Screening: (Low Dose CT Chest recommended if Age 6-80 years, 30 pack-year currently smoking OR have quit w/in 15years.) does qualify.   Lung Cancer Screening Referral: Referral placed 11/27/2019  Additional Screening:  Hepatitis C Screening: does not qualify;   Vision Screening: Recommended annual ophthalmology exams for early detection of glaucoma and other disorders of the eye. Is the patient up to date with their annual eye exam?  Yes  Who is the provider or what is the name of the office in which the patient attends annual eye exams? [place] [employer]  If pt is not established with a provider, would they like to be referred to a provider to establish care? No .   Dental Screening: Recommended annual  dental exams for proper oral hygiene  Community Resource Referral / Chronic Care Management: CRR required this visit?  No   CCM required this visit?  No      Plan:     I have personally reviewed and noted the following in the patient's chart:   . Medical and social history . Use of alcohol, tobacco or illicit drugs  . Current medications and supplements . Functional  ability and status . Nutritional status . Physical activity . Advanced directives . List of other physicians . Hospitalizations, surgeries, and ER visits in previous 12 months . Vitals . Screenings to include cognitive, depression, and falls . Referrals and appointments  In addition, I have reviewed and discussed with patient certain preventive protocols, quality metrics, and best practice recommendations. A written personalized care plan for preventive services as well as general preventive health recommendations were provided to patient.     Ofilia Neas, LPN   [id number]   Nurse Notes: None

## 2019-12-08 NOTE — Patient Instructions (Signed)
[name] , Thank you for taking time to come for your Medicare Wellness Visit. I appreciate your ongoing commitment to your health goals. Please review the following plan we discussed and let me know if I can assist you in the future.   Screening recommendations/referrals: Colonoscopy: up to date, next due 08/02/2021 Recommended yearly ophthalmology/optometry visit for glaucoma screening and checkup Recommended yearly dental visit for hygiene and checkup  Vaccinations: Influenza vaccine: Currently due, you may receive in our office or at your next office visit  Pneumococcal vaccine: Completed series Tdap vaccine: Currently due, you may receive at your next office visit  Shingles vaccine: Completed series    Advanced directives: Please bring a copy of your advanced medical directives into the office so that we may scan them into your chart.   Conditions/risks identified: None   Next appointment: None   Preventive Care 65 Years and Older, Male Preventive care refers to lifestyle choices and visits with your health care provider that can promote health and wellness. What does preventive care include?  A yearly physical exam. This is also called an annual well check.  Dental exams once or twice a year.  Routine eye exams. Ask your health care provider how often you should have your eyes checked.  Personal lifestyle choices, including:  Daily care of your teeth and gums.  Regular physical activity.  Eating a healthy diet.  Avoiding tobacco and drug use.  Limiting alcohol use.  Practicing safe sex.  Taking low doses of aspirin every day.  Taking vitamin and mineral supplements as recommended by your health care provider. What happens during an annual well check? The services and screenings done by your health care provider during your annual well check will depend on your age, overall health, lifestyle risk factors, and family history of disease. Counseling  Your health  care provider may ask you questions about your:  Alcohol use.  Tobacco use.  Drug use.  Emotional well-being.  Home and relationship well-being.  Sexual activity.  Eating habits.  History of falls.  Memory and ability to understand (cognition).  Work and work Statistician. Screening  You may have the following tests or measurements:  Height, weight, and BMI.  Blood pressure.  Lipid and cholesterol levels. These may be checked every 5 years, or more frequently if you are over 19 years old.  Skin check.  Lung cancer screening. You may have this screening every year starting at age 19 if you have a 30-pack-year history of smoking and currently smoke or have quit within the past 15 years.  Fecal occult blood test (FOBT) of the stool. You may have this test every year starting at age 38.  Flexible sigmoidoscopy or colonoscopy. You may have a sigmoidoscopy every 5 years or a colonoscopy every 10 years starting at age 11.  Prostate cancer screening. Recommendations will vary depending on your family history and other risks.  Hepatitis C blood test.  Hepatitis B blood test.  Sexually transmitted disease (STD) testing.  Diabetes screening. This is done by checking your blood sugar (glucose) after you have not eaten for a while (fasting). You may have this done every 1-3 years.  Abdominal aortic aneurysm (AAA) screening. You may need this if you are a current or former smoker.  Osteoporosis. You may be screened starting at age 37 if you are at high risk. Talk with your health care provider about your test results, treatment options, and if necessary, the need for more tests. Vaccines  Your health care provider may recommend certain vaccines, such as:  Influenza vaccine. This is recommended every year.  Tetanus, diphtheria, and acellular pertussis (Tdap, Td) vaccine. You may need a Td booster every 10 years.  Zoster vaccine. You may need this after age  74.  Pneumococcal 13-valent conjugate (PCV13) vaccine. One dose is recommended after age 19.  Pneumococcal polysaccharide (PPSV23) vaccine. One dose is recommended after age 2. Talk to your health care provider about which screenings and vaccines you need and how often you need them. This information is not intended to replace advice given to you by your health care provider. Make sure you discuss any questions you have with your health care provider. Document Released: 02/22/2015 Document Revised: 10/16/2015 Document Reviewed: 11/27/2014 Elsevier Interactive Patient Education  2017 [place] Prevention in the Home Falls can cause injuries. They can happen to people of all ages. There are many things you can do to make your home safe and to help prevent falls. What can I do on the outside of my home?  Regularly fix the edges of walkways and driveways and fix any cracks.  Remove anything that might make you trip as you walk through a door, such as a raised step or threshold.  Trim any bushes or trees on the path to your home.  Use bright outdoor lighting.  Clear any walking paths of anything that might make someone trip, such as rocks or tools.  Regularly check to see if handrails are loose or broken. Make sure that both sides of any steps have handrails.  Any raised decks and porches should have guardrails on the edges.  Have any leaves, snow, or ice cleared regularly.  Use sand or salt on walking paths during winter.  Clean up any spills in your garage right away. This includes oil or grease spills. What can I do in the bathroom?  Use night lights.  Install grab bars by the toilet and in the tub and shower. Do not use towel bars as grab bars.  Use non-skid mats or decals in the tub or shower.  If you need to sit down in the shower, use a plastic, non-slip stool.  Keep the floor dry. Clean up any water that spills on the floor as soon as it happens.  Remove  soap buildup in the tub or shower regularly.  Attach bath mats securely with double-sided non-slip rug tape.  Do not have throw rugs and other things on the floor that can make you trip. What can I do in the bedroom?  Use night lights.  Make sure that you have a light by your bed that is easy to reach.  Do not use any sheets or blankets that are too big for your bed. They should not hang down onto the floor.  Have a firm chair that has side arms. You can use this for support while you get dressed.  Do not have throw rugs and other things on the floor that can make you trip. What can I do in the kitchen?  Clean up any spills right away.  Avoid walking on wet floors.  Keep items that you use a lot in easy-to-reach places.  If you need to reach something above you, use a strong step stool that has a grab bar.  Keep electrical cords out of the way.  Do not use floor polish or wax that makes floors slippery. If you must use wax, use non-skid floor wax.  Do  not have throw rugs and other things on the floor that can make you trip. What can I do with my stairs?  Do not leave any items on the stairs.  Make sure that there are handrails on both sides of the stairs and use them. Fix handrails that are broken or loose. Make sure that handrails are as long as the stairways.  Check any carpeting to make sure that it is firmly attached to the stairs. Fix any carpet that is loose or worn.  Avoid having throw rugs at the top or bottom of the stairs. If you do have throw rugs, attach them to the floor with carpet tape.  Make sure that you have a light switch at the top of the stairs and the bottom of the stairs. If you do not have them, ask someone to add them for you. What else can I do to help prevent falls?  Wear shoes that:  Do not have high heels.  Have rubber bottoms.  Are comfortable and fit you well.  Are closed at the toe. Do not wear sandals.  If you use a  stepladder:  Make sure that it is fully opened. Do not climb a closed stepladder.  Make sure that both sides of the stepladder are locked into place.  Ask someone to hold it for you, if possible.  Clearly mark and make sure that you can see:  Any grab bars or handrails.  First and last steps.  Where the edge of each step is.  Use tools that help you move around (mobility aids) if they are needed. These include:  Canes.  Walkers.  Scooters.  Crutches.  Turn on the lights when you go into a dark area. Replace any light bulbs as soon as they burn out.  Set up your furniture so you have a clear path. Avoid moving your furniture around.  If any of your floors are uneven, fix them.  If there are any pets around you, be aware of where they are.  Review your medicines with your doctor. Some medicines can make you feel dizzy. This can increase your chance of falling. Ask your doctor what other things that you can do to help prevent falls. This information is not intended to replace advice given to you by your health care provider. Make sure you discuss any questions you have with your health care provider. Document Released: 11/22/2008 Document Revised: 07/04/2015 Document Reviewed: 03/02/2014 Elsevier Interactive Patient Education  2017 [employer].

## 2019-12-12 DIAGNOSIS — Z20822 Contact with and (suspected) exposure to covid-19: Secondary | ICD-10-CM | POA: Diagnosis not present

## 2019-12-12 DIAGNOSIS — B349 Viral infection, unspecified: Secondary | ICD-10-CM | POA: Diagnosis not present

## 2019-12-20 DIAGNOSIS — H353132 Nonexudative age-related macular degeneration, bilateral, intermediate dry stage: Secondary | ICD-10-CM | POA: Diagnosis not present

## 2020-01-11 ENCOUNTER — Other Ambulatory Visit: Payer: Self-pay | Admitting: Family Medicine

## 2020-01-12 ENCOUNTER — Encounter: Payer: Self-pay | Admitting: Family Medicine

## 2020-01-16 ENCOUNTER — Encounter: Payer: Self-pay | Admitting: Family Medicine

## 2020-01-16 NOTE — Telephone Encounter (Signed)
That's a wonderful idea. Have him contact the [place] at [phone]

## 2020-01-31 ENCOUNTER — Other Ambulatory Visit: Payer: Self-pay

## 2020-01-31 ENCOUNTER — Ambulatory Visit: Payer: Medicare Other | Admitting: Family Medicine

## 2020-01-31 ENCOUNTER — Encounter: Payer: Self-pay | Admitting: Family Medicine

## 2020-01-31 VITALS — BP 130/80 | HR 60 | Resp 16 | Ht 71.75 in | Wt 236.0 lb

## 2020-01-31 DIAGNOSIS — E6609 Other obesity due to excess calories: Secondary | ICD-10-CM

## 2020-01-31 DIAGNOSIS — Z6832 Body mass index (BMI) 32.0-32.9, adult: Secondary | ICD-10-CM

## 2020-01-31 DIAGNOSIS — H903 Sensorineural hearing loss, bilateral: Secondary | ICD-10-CM

## 2020-01-31 DIAGNOSIS — T162XXA Foreign body in left ear, initial encounter: Secondary | ICD-10-CM | POA: Diagnosis not present

## 2020-01-31 NOTE — Patient Instructions (Addendum)
A few things to remember from today's visit:   Sensorineural hearing loss (SNHL) of both ears  Foreign body of left ear, initial encounter  If you need refills please call your pharmacy. Do not use My Chart to request refills or for acute issues that need immediate attention.   No wax seen today. Small excoriation left on left ear canal, it should heal well with no problem.  Please be sure medication list is accurate. If a new problem present, please set up appointment sooner than planned today.

## 2020-01-31 NOTE — Progress Notes (Signed)
ACUTE VISIT Chief Complaint  Patient presents with  . Ear Pain   HPI: [name] is a 79 y.o. male with history of BPH, GERD, DM two, hypertension, and depression here today complaining of hearing loss. This is a chronic problem, he tried some hearing aids at [facility] but did not feel comfortable. According to patient, he was told he had cerumen excess in right ear, so they could not see TM.  He used a OTC ear lavage kit, got some cerumen out but it did not improve hearing.  Muffled noise and some soreness, improved. He would like ears to be "flushed."  He has not noted ear drainage. Negative for fever, recent URI, recent travel. + Tinnitus, chronic.  He tells me that he is trying to follow a healthier diet, stopped alcohol intake about 3 weeks ago, he has not had withdrawal-like symptoms. He was concerned about LFT's but got a good report for PCP last time LFT was done.  He also recently started Pilates. His son lives in [place].  Review of Systems  Constitutional: Negative for activity change and appetite change.  HENT: Negative for congestion, facial swelling, rhinorrhea and sore throat.   Gastrointestinal: Negative for abdominal pain, nausea and vomiting.  Neurological: Negative for dizziness, syncope and headaches.  Psychiatric/Behavioral: Negative for confusion and hallucinations.  Rest see pertinent positives and negatives per HPI.  Current Outpatient Medications on File Prior to Visit  Medication Sig Dispense Refill  . alprazolam (XANAX) 2 MG tablet TAKE 1 TABLET EVERY 6 HOURS AS NEEDED FOR ANXIETY. 120 tablet 5  . famotidine (PEPCID) 40 MG tablet Take 1 tablet (40 mg total) by mouth in the morning. 90 tablet 3  . latanoprost (XALATAN) 0.005 % ophthalmic solution SMARTSIG:In Eye(s)    . lisinopril-hydrochlorothiazide (ZESTORETIC) 10-12.5 MG tablet Take 1 tablet by mouth daily. 90 tablet 3  . metoprolol succinate (TOPROL-XL) 100 MG 24 hr tablet Take 1 tablet (100 mg  total) by mouth daily. Take with or immediately following a meal. 90 tablet 3  . mirtazapine (REMERON) 15 MG tablet Take 1 tablet (15 mg total) by mouth at bedtime. 90 tablet 3  . Multiple Vitamins-Minerals (PRESERVISION AREDS PO) Take by mouth.    . naproxen sodium (ALEVE) 220 MG tablet Take 220 mg by mouth.    Marland Kitchen omeprazole (PRILOSEC) 40 MG capsule Take 1 capsule (40 mg total) by mouth at bedtime. 180 capsule 0  . polyethylene glycol (MIRALAX / GLYCOLAX) packet Take 17 g by mouth 2 (two) times daily. 14 each 0  . tamsulosin (FLOMAX) 0.4 MG CAPS capsule Take 1 capsule (0.4 mg total) by mouth daily. 90 capsule 3  . temazepam (RESTORIL) 30 MG capsule TAKE 1 CAPSULE AT BEDTIME AS NEEDED FOR SLEEP. 30 capsule 5   No current facility-administered medications on file prior to visit.   Past Medical History:  Diagnosis Date  . Allergy   . Anxiety   . Arthritis   . BPH (benign prostatic hyperplasia)    with elevated PSA, sees Dr. Diona Fanti, benign  . Cataract   . Elevated PSA   . GERD (gastroesophageal reflux disease)   . Hyperlipidemia   . Hypertension   . Insomnia   . Platelets decreased ([place])   . Pneumonia    walking PNA  . Pre-diabetes    No meds   Allergies  Allergen Reactions  . Codeine     hallucinations  . Diclofenac     Easy bruising and GI bleeding   .  Norco [Hydrocodone-Acetaminophen]     Hallucinations     Social History   Socioeconomic History  . Marital status: Widowed    Spouse name: Not on file  . Number of children: Not on file  . Years of education: Not on file  . Highest education level: Not on file  Occupational History  . Not on file  Tobacco Use  . Smoking status: Former Smoker    Packs/day: 1.50    Types: Cigarettes    Quit date: 02/09/1974    Years since quitting: 46.0  . Smokeless tobacco: Never Used  . Tobacco comment: quit over 63 yo, absolutely smoked more than 100 cig; referred for AAA   Vaping Use  . Vaping Use: Never used  Substance and  Sexual Activity  . Alcohol use: Yes    Alcohol/week: 7.0 standard drinks    Types: 7 Shots of liquor per week    Comment: daily  . Drug use: Yes    Types: Marijuana    Comment: occas uses 1 time every other month - this past weekend  . Sexual activity: Not Currently  Other Topics Concern  . 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 Difficulty of Paying Living Expenses: Not hard at all  Food Insecurity: No Food Insecurity  . Worried About Charity fundraiser in the Last Year: Never true  . Ran Out of Food in the Last Year: Never true  Transportation Needs: No Transportation Needs  . Lack of Transportation (Medical): No  . Lack of Transportation (Non-Medical): No  Physical Activity: Inactive  . Days of Exercise per Week: 0 days  . Minutes of Exercise per Session: 0 min  Stress: No Stress Concern Present  . Feeling of Stress : Not at all  Social Connections: Socially Isolated  . Frequency of Communication with Friends and Family: More than three times a week  . Frequency of Social Gatherings with Friends and Family: Twice a week  . Attends Religious Services: Never  . Active Member of Clubs or Organizations: No  . Attends Archivist Meetings: Never  . Marital Status: Widowed   Vitals:   01/31/20 1455  BP: 130/80  Pulse: 60  Resp: 16  SpO2: 96%   Wt Readings from Last 3 Encounters:  01/31/20 236 lb (107 kg)  11/27/19 231 lb 9.6 oz (105.1 kg)  11/06/19 231 lb (104.8 kg)   Body mass index is 32.23 kg/m.  Physical Exam Vitals and nursing note reviewed.  Constitutional:      General: He is not in acute distress. HENT:     Head: Normocephalic and atraumatic.     Right Ear: Tympanic membrane and external ear normal. Decreased hearing noted. No drainage or tenderness.      Left Ear: Tympanic membrane and external ear normal. Decreased hearing noted. No drainage or tenderness. A foreign body is present.     Ears:     Comments: No crumen in ear canal, bilateral.    Mouth/Throat:     Mouth: Mucous membranes are moist.     Pharynx: Oropharynx is clear.  Eyes:     Conjunctiva/sclera: Conjunctivae normal.  Pulmonary:     Effort: Pulmonary effort is normal. No respiratory distress.  Lymphadenopathy:  Cervical: No cervical adenopathy.  Neurological:     General: No focal deficit present.     Mental Status: He is alert and oriented to person, place, and time.     Gait: Gait normal.  Psychiatric:        Mood and Affect: Mood is not anxious or depressed.    ASSESSMENT AND PLAN:  HK.FEXMDY was seen today for ear pain.  Diagnoses and all orders for this visit:  Foreign body of left ear, initial encounter After discussion of procedure and verbal consent, foreign body from left ear was removed using a curette and fine tip forceps. Plastic cone like shape, soft,1.3 max diameter. He tolerated procedure well. There is a small superficial excoriation on skin of ear canal, not bleeding.  Instructed about warning signs.  Sensorineural hearing loss (SNHL) of both ears Continue following with audiologist.  Class 1 obesity due to excess calories with serious comorbidity and body mass index (BMI) of 32.0 to 32.9 in adult Encouraged to continue following a healthful diet and regular physical activity, the latter one low impact is preferable.   Return if symptoms worsen or fail to improve.   Benjamin Bookwalter G. [place], MD  [facility]. [place] office.   A few things to remember from today's visit:   Sensorineural hearing loss (SNHL) of both ears  Foreign body of left ear, initial encounter  If you need refills please call your pharmacy. Do not use My Chart to request refills or for acute issues that need immediate attention.   No wax seen today. Small  excoriation left on left ear canal, it should heal well with no problem.  Please be sure medication list is accurate. If a new problem present, please set up appointment sooner than planned today.

## 2020-02-12 ENCOUNTER — Other Ambulatory Visit: Payer: Self-pay | Admitting: Family Medicine

## 2020-02-22 DIAGNOSIS — H524 Presbyopia: Secondary | ICD-10-CM | POA: Diagnosis not present

## 2020-02-22 DIAGNOSIS — I10 Essential (primary) hypertension: Secondary | ICD-10-CM | POA: Diagnosis not present

## 2020-02-22 DIAGNOSIS — H2513 Age-related nuclear cataract, bilateral: Secondary | ICD-10-CM | POA: Diagnosis not present

## 2020-02-22 DIAGNOSIS — H40013 Open angle with borderline findings, low risk, bilateral: Secondary | ICD-10-CM | POA: Diagnosis not present

## 2020-03-06 DIAGNOSIS — Z96652 Presence of left artificial knee joint: Secondary | ICD-10-CM | POA: Diagnosis not present

## 2020-03-06 DIAGNOSIS — M25562 Pain in left knee: Secondary | ICD-10-CM | POA: Diagnosis not present

## 2020-03-06 DIAGNOSIS — M25561 Pain in right knee: Secondary | ICD-10-CM | POA: Diagnosis not present

## 2020-03-06 DIAGNOSIS — M7671 Peroneal tendinitis, right leg: Secondary | ICD-10-CM | POA: Diagnosis not present

## 2020-03-12 ENCOUNTER — Other Ambulatory Visit: Payer: Self-pay | Admitting: Family Medicine

## 2020-03-12 NOTE — Telephone Encounter (Signed)
Requesting: Xanax Contract: 03/15/13 UDS :03/15/13 Last Visit: 01/16/2020 Next Visit: none Last Refill: 09/12/2019  Please Advise

## 2020-03-27 DIAGNOSIS — H2513 Age-related nuclear cataract, bilateral: Secondary | ICD-10-CM | POA: Diagnosis not present

## 2020-03-27 DIAGNOSIS — H401131 Primary open-angle glaucoma, bilateral, mild stage: Secondary | ICD-10-CM | POA: Diagnosis not present

## 2020-03-27 DIAGNOSIS — H04123 Dry eye syndrome of bilateral lacrimal glands: Secondary | ICD-10-CM | POA: Diagnosis not present

## 2020-03-27 DIAGNOSIS — H25013 Cortical age-related cataract, bilateral: Secondary | ICD-10-CM | POA: Diagnosis not present

## 2020-04-08 ENCOUNTER — Other Ambulatory Visit: Payer: Self-pay

## 2020-04-08 ENCOUNTER — Ambulatory Visit (INDEPENDENT_AMBULATORY_CARE_PROVIDER_SITE_OTHER): Payer: Medicare Other | Admitting: Family Medicine

## 2020-04-08 VITALS — BP 130/66 | Ht 71.0 in | Wt 230.0 lb

## 2020-04-08 DIAGNOSIS — M222X2 Patellofemoral disorders, left knee: Secondary | ICD-10-CM

## 2020-04-08 DIAGNOSIS — M222X1 Patellofemoral disorders, right knee: Secondary | ICD-10-CM | POA: Diagnosis not present

## 2020-04-08 NOTE — Assessment & Plan Note (Signed)
Has weakness and instability on exam.  Likely related to his symptoms that he gets after walking. -Counseled on home exercise therapy and supportive care. -Green sport insoles with lateral post.  Could consider heel wedge if necessary. -Could consider custom orthotics.

## 2020-04-08 NOTE — Progress Notes (Signed)
Benjamin [name] - 80 y.o. male MRN [id number]  Date of birth: [birth year]  SUBJECTIVE:  Including CC & ROS.  No chief complaint on file.   Benjamin [name] is a 80 y.o. male that is presenting with bilateral knee pain and right leg pain. Has a history of arthroplasty f the right hip and left knee. Pain is worse with walking. Pain will be worse at night or in the morning. No injury or inciting event. Has tried aleve. Is active in piliates.    Review of Systems See HPI   HISTORY: Past Medical, Surgical, Social, and Family History Reviewed & Updated per EMR.   Pertinent Historical Findings include:  Past Medical History:  Diagnosis Date  . Allergy   . Anxiety   . Arthritis   . BPH (benign prostatic hyperplasia)    with elevated PSA, sees Dr. Diona Fanti, benign  . Cataract   . Elevated PSA   . GERD (gastroesophageal reflux disease)   . Hyperlipidemia   . Hypertension   . Insomnia   . Platelets decreased ([place])   . Pneumonia    walking PNA  . Pre-diabetes    No meds    Past Surgical History:  Procedure Laterality Date  . CHOLECYSTECTOMY N/A 01/10/2014   Procedure: LAPAROSCOPIC CHOLECYSTECTOMY WITH INTRAOPERATIVE CHOLANGIOGRAM;  Surgeon: Pedro Earls, MD;  Location: [facility];  Service: General;  Laterality: N/A;  . COLONOSCOPY  08/03/2018   per Dr. Carlean Purl, adenomatous polyps, no repeats needed   . JOINT REPLACEMENT     Right hip arthroplasty Dr. Alvan Dame 01/05/17   . left knee arthroscopy     . LIPOSUCTION  1987  . prostate biopsies in 2003 and again 05/2008     per Dr. Diona Fanti. benign  . REFRACTIVE SURGERY    . repair  of large umblicial hernia     per Dr. Kaylyn Lim on 01/10/10  . right knee arthroscopy    . TONSILLECTOMY    . 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 TOTAL KNEE ARTHROPLASTY Left 07/16/2014   Procedure:  LEFT TOTAL KNEE ARTHROPLASTY;  Surgeon: Paralee Cancel, MD;  Location: [facility];  Service: Orthopedics;  Laterality: Left;    Family History  Problem Relation Age of Onset  . Alzheimer's disease Other   . Colon cancer Neg Hx   . Esophageal cancer Neg Hx   . Pancreatic cancer Neg Hx   . Prostate cancer Neg Hx   . Rectal cancer Neg Hx   . Stomach cancer Neg Hx     Social History   Socioeconomic History  . Marital status: Widowed    Spouse name: Not on file  . Number of children: Not on file  . Years of education: Not on file  . Highest education level: Not on file  Occupational History  . Not on file  Tobacco Use  . Smoking status: Former Smoker    Packs/day: 1.50    Types: Cigarettes    Quit date: 02/09/1974    Years since quitting: 46.1  . Smokeless tobacco: Never Used  . Tobacco comment: quit over 38 yo, absolutely smoked more than 100 cig; referred for AAA   Vaping Use  . Vaping Use: Never used  Substance and Sexual Activity  . Alcohol use: Yes    Alcohol/week: 7.0 standard drinks    Types: 7 Shots  of liquor per week    Comment: daily  . Drug use: Yes    Types: Marijuana    Comment: occas uses 1 time every other month - this past weekend  . Sexual activity: Not Currently  Other Topics Concern  . 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 Difficulty of Paying Living Expenses: Not hard at all  Food Insecurity: No Food Insecurity  . Worried About Charity fundraiser in the Last Year: Never true  . Ran Out of Food in the Last Year: Never true  Transportation Needs: No Transportation Needs  . Lack of Transportation (Medical): No  . Lack of Transportation (Non-Medical): No  Physical Activity: Inactive  . Days of Exercise per Week: 0 days  . Minutes of Exercise per Session: 0 min   Stress: No Stress Concern Present  . Feeling of Stress : Not at all  Social Connections: Socially Isolated  . Frequency of Communication with Friends and Family: More than three times a week  . Frequency of Social Gatherings with Friends and Family: Twice a week  . Attends Religious Services: Never  . Active Member of Clubs or Organizations: No  . Attends Archivist Meetings: Never  . Marital Status: Widowed  Intimate Partner Violence: Not At Risk  . Fear of Current or Ex-Partner: No  . Emotionally Abused: No  . Physically Abused: No  . Sexually Abused: No     PHYSICAL EXAM:  VS: BP 130/66 (BP Location: Left Arm, Patient Position: Sitting, Cuff Size: Large)   Ht 5\' 11"  (1.803 m)   Wt 230 lb (104.3 kg)   BMI 32.08 kg/m  Physical Exam Gen: NAD, alert, cooperative with exam, well-appearing MSK:  Right and left leg: Normal strength resistance. Normal range of motion. Instability 1 leg standing. Instability and weakness with hip flexion and abduction. Negative straight leg raise. No leg length discrepancy. Neurovascular intact     ASSESSMENT & PLAN:   Patellofemoral pain syndrome of both knees Has weakness and instability on exam.  Likely related to his symptoms that he gets after walking. -Counseled on home exercise therapy and supportive care. -Green sport insoles with lateral post.  Could consider heel wedge if necessary. -Could consider custom orthotics.

## 2020-04-08 NOTE — Patient Instructions (Signed)
Nice to meet you Please try the exercises  Please try the insoles  Please use aleve as needed  Please send me a message in MyChart with any questions or updates.  Please see me back in 4 weeks.   --Dr. Raeford Razor

## 2020-04-09 DEATH — deceased

## 2020-04-10 ENCOUNTER — Telehealth: Payer: Self-pay | Admitting: Family Medicine

## 2020-04-10 NOTE — Telephone Encounter (Signed)
Physician's Release for Physical Activity to be filled out- placed in dr's folder.  Call [phone] upon completion.

## 2020-04-11 NOTE — Telephone Encounter (Signed)
Pt physician's release form has been received and placed on Dr Sarajane Jews folder for completing, pt will be notified when form is ready

## 2020-04-12 NOTE — Telephone Encounter (Signed)
Left a detailed message for pt to pick up form in the office

## 2020-04-12 NOTE — Telephone Encounter (Signed)
The form is ready  

## 2020-04-19 DIAGNOSIS — H401133 Primary open-angle glaucoma, bilateral, severe stage: Secondary | ICD-10-CM | POA: Diagnosis not present

## 2020-05-13 ENCOUNTER — Ambulatory Visit: Payer: Medicare Other | Admitting: Family Medicine

## 2020-05-13 DIAGNOSIS — H25013 Cortical age-related cataract, bilateral: Secondary | ICD-10-CM | POA: Diagnosis not present

## 2020-05-13 DIAGNOSIS — H04123 Dry eye syndrome of bilateral lacrimal glands: Secondary | ICD-10-CM | POA: Diagnosis not present

## 2020-05-13 DIAGNOSIS — H2513 Age-related nuclear cataract, bilateral: Secondary | ICD-10-CM | POA: Diagnosis not present

## 2020-05-13 DIAGNOSIS — H401131 Primary open-angle glaucoma, bilateral, mild stage: Secondary | ICD-10-CM | POA: Diagnosis not present

## 2020-05-13 NOTE — Progress Notes (Deleted)
Dontai Pember - 80 y.o. male MRN [id number]  Date of birth: Mar 10, 1940  SUBJECTIVE:  Including CC & ROS.  No chief complaint on file.   Naoki Migliaccio is a 80 y.o. male that is  ***.  ***   Review of Systems See HPI   HISTORY: Past Medical, Surgical, Social, and Family History Reviewed & Updated per EMR.   Pertinent Historical Findings include:  Past Medical History:  Diagnosis Date  . Allergy   . Anxiety   . Arthritis   . BPH (benign prostatic hyperplasia)    with elevated PSA, sees Dr. Diona Fanti, benign  . Cataract   . Elevated PSA   . GERD (gastroesophageal reflux disease)   . Hyperlipidemia   . Hypertension   . Insomnia   . Platelets decreased ([place])   . Pneumonia    walking PNA  . Pre-diabetes    No meds    Past Surgical History:  Procedure Laterality Date  . CHOLECYSTECTOMY N/A 01/10/2014   Procedure: LAPAROSCOPIC CHOLECYSTECTOMY WITH INTRAOPERATIVE CHOLANGIOGRAM;  Surgeon: Pedro Earls, MD;  Location: [facility];  Service: General;  Laterality: N/A;  . COLONOSCOPY  08/03/2018   per Dr. Carlean Purl, adenomatous polyps, no repeats needed   . JOINT REPLACEMENT     Right hip arthroplasty Dr. Alvan Dame 01/05/17   . left knee arthroscopy     . LIPOSUCTION  1987  . prostate biopsies in 2003 and again 05/2008     per Dr. Diona Fanti. benign  . REFRACTIVE SURGERY    . repair  of large umblicial hernia     per Dr. Kaylyn Lim on 01/10/10  . right knee arthroscopy    . TONSILLECTOMY    . 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 TOTAL KNEE ARTHROPLASTY Left 07/16/2014   Procedure: LEFT TOTAL KNEE ARTHROPLASTY;  Surgeon: Paralee Cancel, MD;  Location: [facility];  Service: Orthopedics;  Laterality: Left;    Family History  Problem Relation Age of Onset  . Alzheimer's disease Other   . Colon cancer Neg Hx   . Esophageal  cancer Neg Hx   . Pancreatic cancer Neg Hx   . Prostate cancer Neg Hx   . Rectal cancer Neg Hx   . Stomach cancer Neg Hx     Social History   Socioeconomic History  . Marital status: Widowed    Spouse name: Not on file  . Number of children: Not on file  . Years of education: Not on file  . Highest education level: Not on file  Occupational History  . Not on file  Tobacco Use  . Smoking status: Former Smoker    Packs/day: 1.50    Types: Cigarettes    Quit date: 02/09/1974    Years since quitting: 46.2  . Smokeless tobacco: Never Used  . Tobacco comment: quit over 49 yo, absolutely smoked more than 100 cig; referred for [employer]   Vaping Use  . Vaping Use: Never used  Substance and Sexual Activity  . Alcohol use: Yes    Alcohol/week: 7.0 standard drinks    Types: 7 Shots of liquor per week    Comment: daily  . Drug use: Yes    Types: Marijuana    Comment: occas uses 1 time every other month - this past weekend  . Sexual activity: Not Currently  Other Topics Concern  .  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 Difficulty of Paying Living Expenses: Not hard at all  Food Insecurity: No Food Insecurity  . Worried About Charity fundraiser in the Last Year: Never true  . Ran Out of Food in the Last Year: Never true  Transportation Needs: No Transportation Needs  . Lack of Transportation (Medical): No  . Lack of Transportation (Non-Medical): No  Physical Activity: Inactive  . Days of Exercise per Week: 0 days  . Minutes of Exercise per Session: 0 min  Stress: No Stress Concern Present  . Feeling of Stress : Not at all  Social Connections: Socially Isolated  . Frequency of Communication with Friends and Family: More than three times a week  . Frequency of Social Gatherings with Friends and  Family: Twice a week  . Attends Religious Services: Never  . Active Member of Clubs or Organizations: No  . Attends Archivist Meetings: Never  . Marital Status: Widowed  Intimate Partner Violence: Not At Risk  . Fear of Current or Ex-Partner: No  . Emotionally Abused: No  . Physically Abused: No  . Sexually Abused: No     PHYSICAL EXAM:  VS: There were no vitals taken for this visit. Physical Exam Gen: NAD, alert, cooperative with exam, well-appearing MSK:  ***      ASSESSMENT & PLAN:   No problem-specific Assessment & Plan notes found for this encounter.

## 2020-05-19 ENCOUNTER — Other Ambulatory Visit: Payer: Self-pay | Admitting: Family Medicine

## 2020-05-29 ENCOUNTER — Other Ambulatory Visit: Payer: Self-pay

## 2020-05-29 ENCOUNTER — Ambulatory Visit: Payer: Medicare Other | Admitting: Family Medicine

## 2020-05-29 ENCOUNTER — Ambulatory Visit (INDEPENDENT_AMBULATORY_CARE_PROVIDER_SITE_OTHER): Payer: Medicare Other | Admitting: Family Medicine

## 2020-05-29 ENCOUNTER — Encounter: Payer: Self-pay | Admitting: Family Medicine

## 2020-05-29 VITALS — BP 110/58 | HR 52 | Temp 97.3°F | Ht 71.0 in | Wt 221.0 lb

## 2020-05-29 DIAGNOSIS — N138 Other obstructive and reflux uropathy: Secondary | ICD-10-CM

## 2020-05-29 DIAGNOSIS — N529 Male erectile dysfunction, unspecified: Secondary | ICD-10-CM

## 2020-05-29 DIAGNOSIS — E119 Type 2 diabetes mellitus without complications: Secondary | ICD-10-CM | POA: Diagnosis not present

## 2020-05-29 DIAGNOSIS — G4700 Insomnia, unspecified: Secondary | ICD-10-CM

## 2020-05-29 DIAGNOSIS — M25551 Pain in right hip: Secondary | ICD-10-CM

## 2020-05-29 DIAGNOSIS — N401 Enlarged prostate with lower urinary tract symptoms: Secondary | ICD-10-CM

## 2020-05-29 DIAGNOSIS — K219 Gastro-esophageal reflux disease without esophagitis: Secondary | ICD-10-CM

## 2020-05-29 DIAGNOSIS — F411 Generalized anxiety disorder: Secondary | ICD-10-CM | POA: Diagnosis not present

## 2020-05-29 DIAGNOSIS — F418 Other specified anxiety disorders: Secondary | ICD-10-CM

## 2020-05-29 DIAGNOSIS — E785 Hyperlipidemia, unspecified: Secondary | ICD-10-CM

## 2020-05-29 DIAGNOSIS — I10 Essential (primary) hypertension: Secondary | ICD-10-CM

## 2020-05-29 DIAGNOSIS — G8929 Other chronic pain: Secondary | ICD-10-CM

## 2020-05-29 LAB — CBC WITH DIFFERENTIAL/PLATELET
Basophils Absolute: 0 10*3/uL (ref 0.0–0.1)
Basophils Relative: 0.5 % (ref 0.0–3.0)
Eosinophils Absolute: 0.1 10*3/uL (ref 0.0–0.7)
Eosinophils Relative: 1.8 % (ref 0.0–5.0)
HCT: 42 % (ref 39.0–52.0)
Hemoglobin: 14.4 g/dL (ref 13.0–17.0)
Lymphocytes Relative: 32.3 % (ref 12.0–46.0)
Lymphs Abs: 1.5 10*3/uL (ref 0.7–4.0)
MCHC: 34.3 g/dL (ref 30.0–36.0)
MCV: 87.4 fl (ref 78.0–100.0)
Monocytes Absolute: 0.4 10*3/uL (ref 0.1–1.0)
Monocytes Relative: 9.6 % (ref 3.0–12.0)
Neutro Abs: 2.6 10*3/uL (ref 1.4–7.7)
Neutrophils Relative %: 55.8 % (ref 43.0–77.0)
Platelets: 146 10*3/uL — ABNORMAL LOW (ref 150.0–400.0)
RBC: 4.8 Mil/uL (ref 4.22–5.81)
RDW: 14 % (ref 11.5–15.5)
WBC: 4.6 10*3/uL (ref 4.0–10.5)

## 2020-05-29 LAB — BASIC METABOLIC PANEL
BUN: 26 mg/dL — ABNORMAL HIGH (ref 6–23)
CO2: 28 mEq/L (ref 19–32)
Calcium: 9.9 mg/dL (ref 8.4–10.5)
Chloride: 98 mEq/L (ref 96–112)
Creatinine, Ser: 1.27 mg/dL (ref 0.40–1.50)
GFR: 53.83 mL/min — ABNORMAL LOW (ref >60.00)
Glucose, Bld: 108 mg/dL — ABNORMAL HIGH (ref 70–99)
Potassium: 4.5 mEq/L (ref 3.5–5.1)
Sodium: 136 mEq/L (ref 135–145)

## 2020-05-29 LAB — LIPID PANEL
Cholesterol: 234 mg/dL — ABNORMAL HIGH (ref 0–200)
HDL: 34.1 mg/dL — ABNORMAL LOW (ref >39.00)
LDL Cholesterol: 167 mg/dL — ABNORMAL HIGH (ref 0–99)
NonHDL: 199.73
Total CHOL/HDL Ratio: 7
Triglycerides: 163 mg/dL — ABNORMAL HIGH (ref 0.0–149.0)
VLDL: 32.6 mg/dL (ref 0.0–40.0)

## 2020-05-29 LAB — HEPATIC FUNCTION PANEL
ALT: 23 U/L (ref 0–53)
AST: 24 U/L (ref 0–37)
Albumin: 4.2 g/dL (ref 3.5–5.2)
Alkaline Phosphatase: 58 U/L (ref 39–117)
Bilirubin, Direct: 0.1 mg/dL (ref 0.0–0.3)
Total Bilirubin: 0.9 mg/dL (ref 0.2–1.2)
Total Protein: 6.5 g/dL (ref 6.0–8.3)

## 2020-05-29 LAB — TSH: TSH: 2.81 u[IU]/mL (ref 0.35–4.50)

## 2020-05-29 LAB — PSA: PSA: 7.98 ng/mL — ABNORMAL HIGH (ref 0.10–4.00)

## 2020-05-29 LAB — T4, FREE: Free T4: 0.98 ng/dL (ref 0.60–1.60)

## 2020-05-29 LAB — T3, FREE: T3, Free: 2.5 pg/mL (ref 2.3–4.2)

## 2020-05-29 LAB — HEMOGLOBIN A1C: Hgb A1c MFr Bld: 5.4 % (ref 4.6–6.5)

## 2020-05-29 MED ORDER — METOPROLOL SUCCINATE ER 100 MG PO TB24: 50.0000 mg | ORAL_TABLET | Freq: Every day | ORAL | 3 refills | Status: DC

## 2020-05-29 NOTE — Progress Notes (Signed)
Subjective:    Patient ID: Benjamin [name], male    DOB: 22-Mar-1940, 80 y.o.   MRN: [id number]  HPI Here to follow up on issues. He is doing very well in general. His wife passed away last 19-Oct-2022 after a long struggle with Parkinson's disease , and he continues to grieve her loss. However this has given him a sense of freedom, and he has taken it upon himself to get healthier. He stopped drinking all alcohol last November, and he is eating a healthier diet. 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 He has lost 15 lbs and he has more energy now than he has for years. His depression and anxiety are stable. His BP is stable. In fact he has been taking only 1/2 of a tablet (50 mg) of Metoprolol succinate daily along with the Lisinopril HCT. His GERD is stable. He does not plan to be sexually active for awhile, but he says he has very little libido.    Review of Systems  Constitutional: Negative.   HENT: Negative.   Eyes: Negative.   Respiratory: Negative.   Cardiovascular: Negative.   Gastrointestinal: Negative.   Genitourinary: Negative.   Musculoskeletal: Negative.   Skin: Negative.   Neurological: Negative.   Psychiatric/Behavioral: Negative.        Objective:   Physical Exam Constitutional:      General: He is not in acute distress.    Appearance: Normal appearance. He is well-developed. He is not diaphoretic.  HENT:     Head: Normocephalic and atraumatic.     Right Ear: External ear normal.     Left Ear: External ear normal.     Nose: Nose normal.     Mouth/Throat:     Pharynx: No oropharyngeal exudate.  Eyes:     General: No scleral icterus.       Right eye: No discharge.        Left eye: No discharge.     Conjunctiva/sclera: Conjunctivae normal.     Pupils: Pupils are equal, round, and reactive to light.  Neck:     Thyroid: No thyromegaly.     Vascular: No JVD.     Trachea: No tracheal deviation.  Cardiovascular:      Rate and Rhythm: Normal rate and regular rhythm.     Heart sounds: Normal heart sounds. No murmur heard. No friction rub. No gallop.   Pulmonary:     Effort: Pulmonary effort is normal. No respiratory distress.     Breath sounds: Normal breath sounds. No wheezing or rales.  Chest:     Chest wall: No tenderness.  Abdominal:     General: Bowel sounds are normal. There is no distension.     Palpations: Abdomen is soft. There is no mass.     Tenderness: There is no abdominal tenderness. There is no guarding or rebound.     Comments: Small reducible non-tender umbilical hernia   Genitourinary:    Penis: No tenderness.   Musculoskeletal:        General: No tenderness. Normal range of motion.     Cervical back: Neck supple.  Lymphadenopathy:     Cervical: No cervical adenopathy.  Skin:    General: Skin is warm and dry.     Coloration: Skin is not pale.     Findings: No erythema or rash.  Neurological:     Mental Status: He is alert and oriented to person,  place, and time.     Cranial Nerves: No cranial nerve deficit.     Motor: No abnormal muscle tone.     Coordination: Coordination normal.     Deep Tendon Reflexes: Reflexes are normal and symmetric. Reflexes normal.  Psychiatric:        Behavior: Behavior normal.        Thought Content: Thought content normal.        Judgment: Judgment normal.           Assessment & Plan:  He is doing well and he is taking his health more seriously. We will get fasting labs to check lipids, etc. We will check a testosterone level for the low libido. His HTN is stable. His GERD is stable. His depression and insomnia are stable, and he continues to go through the grief process. His insomnia is stable. We will check a PSA, and I reminded him he is past due to follow up with Dr. Diona Fanti (Urology) again.  We spent 50 minutes together today.  Alysia Penna, MD

## 2020-05-30 ENCOUNTER — Other Ambulatory Visit (INDEPENDENT_AMBULATORY_CARE_PROVIDER_SITE_OTHER): Payer: Medicare Other

## 2020-05-30 DIAGNOSIS — N529 Male erectile dysfunction, unspecified: Secondary | ICD-10-CM | POA: Diagnosis not present

## 2020-05-30 LAB — TESTOSTERONE: Testosterone: 353.82 ng/dL (ref 300.00–890.00)

## 2020-06-17 DIAGNOSIS — H401132 Primary open-angle glaucoma, bilateral, moderate stage: Secondary | ICD-10-CM | POA: Diagnosis not present

## 2020-06-18 DIAGNOSIS — H401130 Primary open-angle glaucoma, bilateral, stage unspecified: Secondary | ICD-10-CM | POA: Diagnosis not present

## 2020-06-18 DIAGNOSIS — H2512 Age-related nuclear cataract, left eye: Secondary | ICD-10-CM | POA: Diagnosis not present

## 2020-06-18 DIAGNOSIS — H2511 Age-related nuclear cataract, right eye: Secondary | ICD-10-CM | POA: Diagnosis not present

## 2020-06-18 DIAGNOSIS — H25012 Cortical age-related cataract, left eye: Secondary | ICD-10-CM | POA: Diagnosis not present

## 2020-06-25 DIAGNOSIS — H2512 Age-related nuclear cataract, left eye: Secondary | ICD-10-CM | POA: Diagnosis not present

## 2020-06-25 DIAGNOSIS — H2511 Age-related nuclear cataract, right eye: Secondary | ICD-10-CM | POA: Diagnosis not present

## 2020-06-28 DIAGNOSIS — Z9189 Other specified personal risk factors, not elsewhere classified: Secondary | ICD-10-CM | POA: Diagnosis not present

## 2020-06-28 DIAGNOSIS — Z1152 Encounter for screening for COVID-19: Secondary | ICD-10-CM | POA: Diagnosis not present

## 2020-07-15 ENCOUNTER — Other Ambulatory Visit: Payer: Self-pay | Admitting: Family Medicine

## 2020-07-15 NOTE — Telephone Encounter (Signed)
Pt LOV was 0420/22 Last refill was done on 01/15/2020 for 30 tablets with 5 refill Please advise

## 2020-07-23 ENCOUNTER — Encounter: Payer: Self-pay | Admitting: Family Medicine

## 2020-07-23 ENCOUNTER — Ambulatory Visit (INDEPENDENT_AMBULATORY_CARE_PROVIDER_SITE_OTHER): Payer: Medicare Other | Admitting: Family Medicine

## 2020-07-23 ENCOUNTER — Other Ambulatory Visit: Payer: Self-pay

## 2020-07-23 VITALS — BP 150/60 | HR 64 | Temp 98.4°F | Wt 216.7 lb

## 2020-07-23 DIAGNOSIS — D492 Neoplasm of unspecified behavior of bone, soft tissue, and skin: Secondary | ICD-10-CM | POA: Diagnosis not present

## 2020-07-23 NOTE — Patient Instructions (Signed)
Set up follow up with ophthalmologist to evaluate cystic lesion right upper lid  Suspect this is benign.

## 2020-07-23 NOTE — Progress Notes (Signed)
Established Patient Office Visit  Subjective:  Patient ID: Benjamin [name], male    DOB: August 20, 1940  Age: 80 y.o. MRN: [id number]  CC:  Chief Complaint  Patient presents with   Stye    HPI [name] presents for growth right upper lid which he first noted a couple of weeks ago.  Seemed to come up fairly rapidly.  He just recently had right cataract surgery.  Denies any acute visual changes.  Has not noted any drainage.  No history of similar skin lesion.  Past Medical History:  Diagnosis Date   Allergy    Anxiety    Arthritis    BPH (benign prostatic hyperplasia)    with elevated PSA, sees Dr. Diona Fanti, benign   Cataract    Elevated PSA    sees Dr. Diona Fanti    GERD (gastroesophageal reflux disease)    Hyperlipidemia    Hypertension    Insomnia    Platelets decreased ([place])    Pneumonia    walking PNA   Pre-diabetes    No meds    Past Surgical History:  Procedure Laterality Date   CHOLECYSTECTOMY N/A 01/10/2014   Procedure: LAPAROSCOPIC CHOLECYSTECTOMY WITH INTRAOPERATIVE CHOLANGIOGRAM;  Surgeon: Pedro Earls, MD;  Location: [facility];  Service: General;  Laterality: N/A;   COLONOSCOPY  08/03/2018   per Dr. Carlean Purl, adenomatous polyps, no repeats needed    JOINT REPLACEMENT     Right hip arthroplasty Dr. Alvan Dame 01/05/17    left knee arthroscopy      LIPOSUCTION  1987   prostate biopsies in 2003 and again 05/2008     per Dr. Diona Fanti. benign   REFRACTIVE SURGERY     repair  of large umblicial hernia     per Dr. 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   Procedure: LEFT TOTAL KNEE ARTHROPLASTY;  Surgeon: Paralee Cancel, MD;  Location: [facility];  Service: Orthopedics;  Laterality: Left;    Family  History  Problem Relation Age of Onset   Alzheimer's disease Other    Colon cancer Neg Hx    Esophageal cancer Neg Hx    Pancreatic cancer Neg Hx    Prostate cancer Neg Hx    Rectal cancer Neg Hx    Stomach cancer Neg Hx     Social History   Socioeconomic History   Marital status: Widowed    Spouse name: Not on file   Number of children: Not on file   Years of education: Not on file   Highest education level: Not on file  Occupational History   Not on file  Tobacco Use   Smoking status: Former    Packs/day: 1.50    Pack years: 0.00    Types: Cigarettes    Quit date: 02/09/1974    Years since quitting: 46.4   Smokeless tobacco: Never   Tobacco comments:    quit over 47 yo, absolutely smoked more than 100 cig; referred for AAA   Vaping Use   Vaping Use: Never used  Substance and Sexual Activity   Alcohol use: Yes    Alcohol/week: 7.0 standard drinks    Types: 7 Shots of liquor per week    Comment: daily  Drug use: Yes    Types: Marijuana    Comment: occas uses 1 time every other month - this past weekend   Sexual activity: Not Currently  Other Topics Concern   Not on file  Social History Narrative   Lives with spouse who had progressive disease   24/7 care   2 children   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      Social Determinants of Health   Financial Resource Strain: Low Risk    Difficulty of Paying Living Expenses: Not hard at all  Food Insecurity: No Food Insecurity   Worried About Charity fundraiser in the Last Year: Never true   Arboriculturist in the Last Year: Never true  Transportation Needs: No Transportation Needs   Lack of Transportation (Medical): No   Lack of Transportation (Non-Medical): No  Physical Activity: Inactive   Days of Exercise per Week: 0 days   Minutes of Exercise per Session: 0 min  Stress: No Stress Concern Present   Feeling of Stress : Not at all  Social Connections: Socially Isolated   Frequency of Communication with  Friends and Family: More than three times a week   Frequency of Social Gatherings with Friends and Family: Twice a week   Attends Religious Services: Never   Marine scientist or Organizations: No   Attends Archivist Meetings: Never   Marital Status: Widowed  Human resources officer Violence: Not At Risk   Fear of Current or Ex-Partner: No   Emotionally Abused: No   Physically Abused: No   Sexually Abused: No    Outpatient Medications Prior to Visit  Medication Sig Dispense Refill   alprazolam (XANAX) 2 MG tablet TAKE 1 TABLET EVERY 6 HOURS AS NEEDED FOR ANXIETY. 120 tablet 5   latanoprost (XALATAN) 0.005 % ophthalmic solution SMARTSIG:In Eye(s)     lisinopril-hydrochlorothiazide (ZESTORETIC) 10-12.5 MG tablet Take 1 tablet by mouth daily. 90 tablet 3   metoprolol succinate (TOPROL-XL) 100 MG 24 hr tablet Take 0.5 tablets (50 mg total) by mouth daily. Take with or immediately following a meal. 90 tablet 3   Multiple Vitamins-Minerals (PRESERVISION AREDS PO) Take by mouth.     naproxen sodium (ALEVE) 220 MG tablet Take 220 mg by mouth.     omeprazole (PRILOSEC) 40 MG capsule TAKE (1) CAPSULE TWICE DAILY. 180 capsule 0   polyethylene glycol (MIRALAX / GLYCOLAX) packet Take 17 g by mouth 2 (two) times daily. 14 each 0   temazepam (RESTORIL) 30 MG capsule TAKE 1 CAPSULE AT BEDTIME AS NEEDED FOR SLEEP. 30 capsule 5   No facility-administered medications prior to visit.    Allergies  Allergen Reactions   Codeine     hallucinations   Diclofenac     Easy bruising and GI bleeding    Norco [Hydrocodone-Acetaminophen]     Hallucinations     ROS Review of Systems  Eyes:  Negative for pain, discharge and visual disturbance.     Objective:    Physical Exam Vitals reviewed.  Constitutional:      Appearance: Normal appearance.  Eyes:     Comments: Patient has very rounded cystic like lesion right mid upper eyelid.  Soft to palpation.  Nontender.  No ulceration.  No  umbilication.  Neurological:     Mental Status: He is alert.    BP (!) 150/60 (BP Location: Left Arm, Patient Position: Sitting, Cuff Size: Normal)   Pulse 64   Temp 98.4 F (36.9  C) (Oral)   Wt 216 lb 11.2 oz (98.3 kg)   SpO2 95%   BMI 30.22 kg/m  Wt Readings from Last 3 Encounters:  07/23/20 216 lb 11.2 oz (98.3 kg)  05/29/20 221 lb (100.2 kg)  05/29/20 221 lb (100.2 kg)     Health Maintenance Due  Topic Date Due   FOOT EXAM  Never done   OPHTHALMOLOGY EXAM  Never done   Hepatitis C Screening  Never done   TETANUS/TDAP  Never done   Zoster Vaccines- Shingrix (2 of 2) 03/25/2017   COVID-19 Vaccine (4 - Booster for Pfizer series) 03/07/2020    There are no preventive care reminders to display for this patient.  Lab Results  Component Value Date   TSH 2.81 05/29/2020   Lab Results  Component Value Date   WBC 4.6 05/29/2020   HGB 14.4 05/29/2020   HCT 42.0 05/29/2020   MCV 87.4 05/29/2020   PLT 146.0 (L) 05/29/2020   Lab Results  Component Value Date   NA 136 05/29/2020   K 4.5 05/29/2020   CO2 28 05/29/2020   GLUCOSE 108 (H) 05/29/2020   BUN 26 (H) 05/29/2020   CREATININE 1.27 05/29/2020   BILITOT 0.9 05/29/2020   ALKPHOS 58 05/29/2020   AST 24 05/29/2020   ALT 23 05/29/2020   PROT 6.5 05/29/2020   ALBUMIN 4.2 05/29/2020   CALCIUM 9.9 05/29/2020   ANIONGAP 5 01/06/2017   GFR 53.83 (L) 05/29/2020   Lab Results  Component Value Date   CHOL 234 (H) 05/29/2020   Lab Results  Component Value Date   HDL 34.10 (L) 05/29/2020   Lab Results  Component Value Date   LDLCALC 167 (H) 05/29/2020   Lab Results  Component Value Date   TRIG 163.0 (H) 05/29/2020   Lab Results  Component Value Date   CHOLHDL 7 05/29/2020   Lab Results  Component Value Date   HGBA1C 5.4 05/29/2020      Assessment & Plan:    Small cystlike growth right upper lid.  This does not look typical for basal cell or squamous cell.  Suspect benign. -Recommend follow-up  with ophthalmologist given location and patient will set up  No orders of the defined types were placed in this encounter.   Follow-up: No follow-ups on file.    Carolann Littler, MD

## 2020-07-28 ENCOUNTER — Other Ambulatory Visit: Payer: Self-pay | Admitting: Family Medicine

## 2020-07-31 NOTE — Telephone Encounter (Signed)
This medication had been discontinued in April 2022.   Received a refill requested.  Can this medication be refilled?

## 2020-07-31 NOTE — Telephone Encounter (Signed)
[facility] is calling in to see if they can get a verbal approval for the below msg.  Pharmacy would like to have a call back.

## 2020-08-23 ENCOUNTER — Other Ambulatory Visit: Payer: Self-pay | Admitting: Family Medicine

## 2020-09-09 ENCOUNTER — Other Ambulatory Visit: Payer: Self-pay | Admitting: Family Medicine

## 2020-09-09 NOTE — Telephone Encounter (Signed)
Pt LOV was on 07/23/2020 Last refill was done on 03/12/2020 Please  advise

## 2020-09-25 DIAGNOSIS — H26491 Other secondary cataract, right eye: Secondary | ICD-10-CM | POA: Diagnosis not present

## 2020-09-25 DIAGNOSIS — H26493 Other secondary cataract, bilateral: Secondary | ICD-10-CM | POA: Diagnosis not present

## 2020-09-25 DIAGNOSIS — Z961 Presence of intraocular lens: Secondary | ICD-10-CM | POA: Diagnosis not present

## 2020-09-25 DIAGNOSIS — H401131 Primary open-angle glaucoma, bilateral, mild stage: Secondary | ICD-10-CM | POA: Diagnosis not present

## 2020-09-25 DIAGNOSIS — H04123 Dry eye syndrome of bilateral lacrimal glands: Secondary | ICD-10-CM | POA: Diagnosis not present

## 2020-10-02 ENCOUNTER — Ambulatory Visit (INDEPENDENT_AMBULATORY_CARE_PROVIDER_SITE_OTHER): Payer: Medicare Other | Admitting: Family Medicine

## 2020-10-02 ENCOUNTER — Other Ambulatory Visit: Payer: Self-pay

## 2020-10-02 ENCOUNTER — Encounter: Payer: Self-pay | Admitting: Family Medicine

## 2020-10-02 VITALS — BP 118/68 | HR 63 | Temp 98.9°F | Wt 211.0 lb

## 2020-10-02 DIAGNOSIS — I10 Essential (primary) hypertension: Secondary | ICD-10-CM

## 2020-10-02 DIAGNOSIS — F418 Other specified anxiety disorders: Secondary | ICD-10-CM | POA: Diagnosis not present

## 2020-10-02 DIAGNOSIS — G4700 Insomnia, unspecified: Secondary | ICD-10-CM | POA: Diagnosis not present

## 2020-10-02 DIAGNOSIS — E785 Hyperlipidemia, unspecified: Secondary | ICD-10-CM | POA: Diagnosis not present

## 2020-10-02 MED ORDER — TADALAFIL 20 MG PO TABS: 20.0000 mg | ORAL_TABLET | Freq: Every day | ORAL | 11 refills | Status: DC | PRN

## 2020-10-02 NOTE — Progress Notes (Signed)
   Subjective:    Patient ID: Benjamin [name], male    DOB: [birth year], 80 y.o.   MRN: [id number]  HPI Here for several issues. First he has been eating a healthy diet and exercising 6 days a week, and he has lost 25 lbs since December. He has totally quit drinking alcohol, and he feels the best he has felt in years. Consequently his BP at home has been dropping, and he began getting readings as low as 90/60. Therefore he stopped taking both his Metoprolol and Lisinopril HCT about 3 weeks ago. Since then the BP has come back up, and it has been borderline high in the 140s over 90s. Second he wants to start weaning off his medications for anxiety and for sleep. He has been taking 1/2 tablet of 2 mg Xanax QID and he has done well with this. He would like to cut done on the Temazepam as well. Finally he wants to check his cholesterol again since he has lost this weight.    Review of Systems  Constitutional: Negative.   Respiratory: Negative.    Cardiovascular: Negative.       Objective:   Physical Exam Constitutional:      Appearance: Normal appearance.  Cardiovascular:     Rate and Rhythm: Normal rate and regular rhythm.     Pulses: Normal pulses.     Heart sounds: Normal heart sounds.  Pulmonary:     Effort: Pulmonary effort is normal.     Breath sounds: Normal breath sounds.  Neurological:     Mental Status: He is alert.          Assessment & Plan:  He has made substantial lifestyle changes and I congratulated him on these successes. For the HTN, he will get back on Lisinopril HCT 10-12.5 daily but he will stay off Metoprolol. For the anxiety, we will stay at 1 mg of Xanax QID at least for now. For the insomnia, we will decrease the Temazepam to 22.5 mg qhs. Finally he will schedule fasting labs for a lipid panel sometime soon.  We spent 35 minutes reviewing records and discussing these issues.  Alysia Penna, MD

## 2020-10-03 ENCOUNTER — Other Ambulatory Visit (INDEPENDENT_AMBULATORY_CARE_PROVIDER_SITE_OTHER): Payer: Medicare Other

## 2020-10-03 DIAGNOSIS — E785 Hyperlipidemia, unspecified: Secondary | ICD-10-CM

## 2020-10-03 LAB — LIPID PANEL
Cholesterol: 212 mg/dL — ABNORMAL HIGH (ref 0–200)
HDL: 35.6 mg/dL — ABNORMAL LOW (ref >39.00)
LDL Cholesterol: 150 mg/dL — ABNORMAL HIGH (ref 0–99)
NonHDL: 176.4
Total CHOL/HDL Ratio: 6
Triglycerides: 131 mg/dL (ref 0.0–149.0)
VLDL: 26.2 mg/dL (ref 0.0–40.0)

## 2020-10-04 ENCOUNTER — Other Ambulatory Visit: Payer: Self-pay

## 2020-10-04 DIAGNOSIS — R17 Unspecified jaundice: Secondary | ICD-10-CM

## 2020-10-04 DIAGNOSIS — E785 Hyperlipidemia, unspecified: Secondary | ICD-10-CM

## 2020-10-04 MED ORDER — ATORVASTATIN CALCIUM 10 MG PO TABS: 10.0000 mg | ORAL_TABLET | Freq: Every day | ORAL | 3 refills | Status: DC

## 2020-10-07 DIAGNOSIS — I10 Essential (primary) hypertension: Secondary | ICD-10-CM | POA: Diagnosis not present

## 2020-10-07 NOTE — Addendum Note (Signed)
Addended by: Amanda Cockayne on: 10/07/2020 02:25 PM   Modules accepted: Orders

## 2020-10-07 NOTE — Addendum Note (Signed)
Addended by: Amanda Cockayne on: 10/07/2020 02:24 PM   Modules accepted: Orders

## 2020-10-08 LAB — HEPATIC FUNCTION PANEL
AG Ratio: 2 (calc) (ref 1.0–2.5)
ALT: 15 U/L (ref 9–46)
AST: 15 U/L (ref 10–35)
Albumin: 4.2 g/dL (ref 3.6–5.1)
Alkaline phosphatase (APISO): 56 U/L (ref 35–144)
Bilirubin, Direct: 0.1 mg/dL (ref 0.0–0.2)
Globulin: 2.1 g/dL (calc) (ref 1.9–3.7)
Indirect Bilirubin: 0.4 mg/dL (calc) (ref 0.2–1.2)
Total Bilirubin: 0.5 mg/dL (ref 0.2–1.2)
Total Protein: 6.3 g/dL (ref 6.1–8.1)

## 2020-10-16 DIAGNOSIS — H26492 Other secondary cataract, left eye: Secondary | ICD-10-CM | POA: Diagnosis not present

## 2020-11-26 ENCOUNTER — Other Ambulatory Visit: Payer: Self-pay | Admitting: Family Medicine

## 2020-12-09 ENCOUNTER — Ambulatory Visit: Payer: Medicare Other

## 2020-12-31 ENCOUNTER — Ambulatory Visit (INDEPENDENT_AMBULATORY_CARE_PROVIDER_SITE_OTHER): Payer: Medicare Other

## 2020-12-31 VITALS — Ht 72.0 in | Wt 201.0 lb

## 2020-12-31 DIAGNOSIS — Z0000 Encounter for general adult medical examination without abnormal findings: Secondary | ICD-10-CM | POA: Diagnosis not present

## 2020-12-31 NOTE — Progress Notes (Addendum)
I connected with Benjamin [name] today by telephone and verified that I am speaking with the correct person using two identifiers. Location patient: home Location provider: work Persons participating in the virtual visit: Benjamin [name], Benjamin Durand LPN.   I discussed the limitations, risks, security and privacy concerns of performing an evaluation and management service by telephone and the availability of in person appointments. I also discussed with the patient that there may be a patient responsible charge related to this service. The patient expressed understanding and verbally consented to this telephonic visit.    Interactive audio and video telecommunications were attempted between this provider and patient, however failed, due to patient having technical difficulties OR patient did not have access to video capability.  We continued and completed visit with audio only.     Vital signs may be patient reported or missing.  Subjective:   Benjamin [name] is a 80 y.o. male who presents for Medicare Annual/Subsequent preventive examination.  Review of Systems     Cardiac Risk Factors include: advanced age (>39men, >13 women);dyslipidemia;hypertension;male gender     Objective:    Today's Vitals   12/31/20 1043  Weight: 201 lb (91.2 kg)  Height: 6' (1.829 m)   Body mass index is 27.26 kg/m.  Advanced Directives 12/31/2020 12/08/2019 01/05/2017 01/05/2017 12/29/2016 04/24/2016 06/10/2015  Does Patient Have a Medical Advance Directive? Yes Yes Yes Yes Yes Yes Yes  Type of Paramedic of [place];Living will [place];Living will Living will;Healthcare Power of Attorney Living will;Healthcare Power of Attorney Living will;Healthcare Power of [place];Living will  Does patient want to make changes to medical advance directive? - No - Patient declined No - Patient declined No - Patient declined No - Patient declined - -   Copy of [place] in Chart? No - copy requested Yes - validated most recent copy scanned in chart (See row information) No - copy requested No - copy requested No - copy requested - -    Current Medications (verified) Outpatient Encounter Medications as of 12/31/2020  Medication Sig   alprazolam (XANAX) 2 MG tablet TAKE 1 TABLET EVERY 6 HOURS AS NEEDED FOR ANXIETY.   atorvastatin (LIPITOR) 10 MG tablet Take 1 tablet (10 mg total) by mouth daily.   latanoprost (XALATAN) 0.005 % ophthalmic solution SMARTSIG:In Eye(s)   lisinopril-hydrochlorothiazide (ZESTORETIC) 10-12.5 MG tablet Take 1 tablet by mouth daily.   Multiple Vitamins-Minerals (PRESERVISION AREDS PO) Take by mouth.   naproxen sodium (ALEVE) 220 MG tablet Take 220 mg by mouth.   omeprazole (PRILOSEC) 40 MG capsule TAKE ONE CAPSULE BY MOUTH TWICE DAILY   tadalafil (CIALIS) 20 MG tablet Take 1 tablet (20 mg total) by mouth daily as needed for erectile dysfunction.   tamsulosin (FLOMAX) 0.4 MG CAPS capsule TAKE (1) CAPSULE DAILY.   temazepam (RESTORIL) 30 MG capsule TAKE 1 CAPSULE AT BEDTIME AS NEEDED FOR SLEEP.   polyethylene glycol (MIRALAX / GLYCOLAX) packet Take 17 g by mouth 2 (two) times daily. (Patient not taking: Reported on 12/31/2020)   No facility-administered encounter medications on file as of 12/31/2020.    Allergies (verified) Codeine, Diclofenac, and Norco [hydrocodone-acetaminophen]   History: Past Medical History:  Diagnosis Date   Allergy    Anxiety    Arthritis    BPH (benign prostatic hyperplasia)    with elevated PSA, sees Dr. Diona Fanti, benign   Cataract    Elevated PSA    sees Dr. Diona Fanti  GERD (gastroesophageal reflux disease)    Hyperlipidemia    Hypertension    Insomnia    Platelets decreased ([facility])    Pneumonia    walking PNA   Pre-diabetes    No meds   Past Surgical History:  Procedure Laterality Date   CHOLECYSTECTOMY N/A 01/10/2014   Procedure: LAPAROSCOPIC  CHOLECYSTECTOMY WITH INTRAOPERATIVE CHOLANGIOGRAM;  Surgeon: Pedro Earls, MD;  [facility];  Service: General;  Laterality: N/A;   COLONOSCOPY  08/03/2018   per Dr. Carlean Purl, adenomatous polyps, no repeats needed    JOINT REPLACEMENT     Right hip arthroplasty Dr. Alvan Dame 01/05/17    left knee arthroscopy      LIPOSUCTION  1987   prostate biopsies in 2003 and again 05/2008     per Dr. Diona Fanti. benign   REFRACTIVE SURGERY     repair  of large umblicial hernia     per Dr. 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   Procedure: LEFT TOTAL KNEE ARTHROPLASTY;  Surgeon: Paralee Cancel, MD;  [facility];  Service: Orthopedics;  Laterality: Left;   Family History  Problem Relation Age of Onset   Alzheimer's disease Other    Colon cancer Neg Hx    Esophageal cancer Neg Hx    Pancreatic cancer Neg Hx    Prostate cancer Neg Hx    Rectal cancer Neg Hx    Stomach cancer Neg Hx    Social History   Socioeconomic History   Marital status: Widowed    Spouse name: Not on file   Number of children: Not on file   Years of education: Not on file   Highest education level: Not on file  Occupational History   Not on file  Tobacco Use   Smoking status: Former    Packs/day: 1.50    Types: Cigarettes    Quit date: 02/09/1974    Years since quitting: 46.9   Smokeless tobacco: Never   Tobacco comments:    quit over 2 yo, absolutely smoked more than 100 cig; referred for AAA   Vaping Use   Vaping Use: Never used  Substance and Sexual Activity   Alcohol use: Yes    Alcohol/week: 7.0 standard drinks    Types: 7 Shots of liquor per week    Comment: daily   Drug use: Yes    Types: Marijuana    Comment: occas uses 1 time  every other month - this past weekend   Sexual activity: Not Currently  Other Topics Concern   Not on file  Social History Narrative   Lives with spouse who had progressive disease   24/7 care   2 children   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      Social Determinants of Health   Financial Resource Strain: Low Risk    Difficulty of Paying Living Expenses: Not hard at all  Food Insecurity: No Food Insecurity   Worried About Charity fundraiser in the Last Year: Never true   Arboriculturist in the Last Year: Never true  Transportation Needs: No Transportation Needs   Lack of Transportation (Medical): No   Lack of Transportation (Non-Medical): No  Physical Activity:  Insufficiently Active   Days of Exercise per Week: 4 days   Minutes of Exercise per Session: 30 min  Stress: No Stress Concern Present   Feeling of Stress : Not at all  Social Connections: Not on file    Tobacco Counseling Counseling given: Not Answered Tobacco comments: quit over 83 yo, absolutely smoked more than 100 cig; referred for AAA    Clinical Intake:  Pre-visit preparation completed: Yes  Pain : No/denies pain     Nutritional Status: BMI 25 -29 Overweight Nutritional Risks: None Diabetes: No  How often do you need to have someone help you when you read instructions, pamphlets, or other written materials from your doctor or pharmacy?: 1 - Never  Diabetic? no  Interpreter Needed?: No  Information entered by :: NAllen LPN   Activities of Daily Living In your present state of health, do you have any difficulty performing the following activities: 12/31/2020  Hearing? Y  Vision? N  Difficulty concentrating or making decisions? N  Walking or climbing stairs? N  Dressing or bathing? N  Doing errands, shopping? N  Preparing Food and eating ? N  Using the Toilet? N  In the past six months, have you accidently leaked urine? N  Do you have problems with loss of bowel control? N  Managing your  Medications? N  Managing your Finances? N  Housekeeping or managing your Housekeeping? N  Some recent data might be hidden    Patient Care Team: Laurey Morale, MD as PCP - General  Indicate any recent Medical Services you may have received from other than [facility] providers in the past year (date may be approximate).     Assessment:   This is a routine wellness examination for Rayn.  Hearing/Vision screen Vision Screening - Comments:: Regular eye exams, [facility]  Dietary issues and exercise activities discussed: Current Exercise Habits: Home exercise routine, Type of exercise: walking, Time (Minutes): 30, Frequency (Times/Week): 4, Weekly Exercise (Minutes/Week): 120   Goals Addressed             This Visit's Progress    Patient Stated       12/31/2020, wants to get weight to 195 pounds       Depression Screen PHQ 2/9 Scores 12/31/2020 05/29/2020 12/08/2019 11/27/2019 11/27/2019 11/12/2017 04/24/2016  PHQ - 2 Score 0 0 1 1 1 1  0  PHQ- 9 Score - 1 2 1  - - -  Exception Documentation - - - Medical reason Medical reason - -    Fall Risk Fall Risk  12/31/2020 05/29/2020 12/08/2019 11/27/2019 11/12/2017  Falls in the past year? 0 0 0 0 No  Number falls in past yr: - 0 0 0 -  Injury with Fall? - 0 0 0 -  Risk for fall due to : Medication side effect No Fall Risks No Fall Risks - -  Follow up Falls evaluation completed;Education provided;Falls prevention discussed - Falls evaluation completed;Falls prevention discussed - -    FALL RISK PREVENTION PERTAINING TO THE HOME:  Any stairs in or around the home? Yes  If so, are there any without handrails? No  Home free of loose throw rugs in walkways, pet beds, electrical cords, etc? Yes  Adequate lighting in your home to reduce risk of falls? Yes   ASSISTIVE DEVICES UTILIZED TO PREVENT FALLS:  Life alert? No  Use of a cane, walker or w/c? No  Grab bars in the bathroom? Yes  Shower chair or bench in shower?  Yes   Elevated toilet seat or a handicapped toilet? No   TIMED UP AND GO:  Was the test performed? No .      Cognitive Function:        Immunizations Immunization History  Administered Date(s) Administered   Fluad Quad(high Dose 65+) 10/11/2018   Influenza Whole 03/04/2009   Influenza, High Dose Seasonal PF 12/30/2016, 11/12/2017, 12/13/2020   Influenza,inj,Quad PF,6+ Mos 11/06/2013   Influenza-Unspecified 11/19/2014, 11/10/2015, 11/10/2015   PFIZER(Purple Top)SARS-COV-2 Vaccination 02/23/2019, 03/16/2019, 11/06/2019, 12/13/2020   PPD Test 07/18/2014   Pneumococcal Conjugate-13 11/06/2013   Pneumococcal Polysaccharide-23 03/04/2009   Zoster Recombinat (Shingrix) 01/28/2017    TDAP status: Due, Education has been provided regarding the importance of this vaccine. Advised may receive this vaccine at local pharmacy or Health Dept. Aware to provide a copy of the vaccination record if obtained from local pharmacy or Health Dept. Verbalized acceptance and understanding.  Flu Vaccine status: Up to date  Pneumococcal vaccine status: Up to date  Covid-19 vaccine status: Completed vaccines  Qualifies for Shingles Vaccine? Yes   Zostavax completed No   Shingrix Completed?: needs second dose  Screening Tests Health Maintenance  Topic Date Due   FOOT EXAM  Never done   OPHTHALMOLOGY EXAM  Never done   Hepatitis C Screening  Never done   TETANUS/TDAP  Never done   Zoster Vaccines- Shingrix (2 of 2) 03/25/2017   HEMOGLOBIN A1C  11/28/2020   COVID-19 Vaccine (5 - Booster for Pfizer series) 02/07/2021   COLONOSCOPY (Pts 45-50yrs Insurance coverage will need to be confirmed)  08/02/2021   Pneumonia Vaccine 57+ Years old  Completed   INFLUENZA VACCINE  Completed   HPV VACCINES  Aged Out    Health Maintenance  Health Maintenance Due  Topic Date Due   FOOT EXAM  Never done   OPHTHALMOLOGY EXAM  Never done   Hepatitis C Screening  Never done   TETANUS/TDAP  Never done   Zoster  Vaccines- Shingrix (2 of 2) 03/25/2017   HEMOGLOBIN A1C  11/28/2020    Colorectal cancer screening: No longer required.   Lung Cancer Screening: (Low Dose CT Chest recommended if Age 52-80 years, 30 pack-year currently smoking OR have quit w/in 15years.) does not qualify.   Lung Cancer Screening Referral: no  Additional Screening:  Hepatitis C Screening: does qualify;   Vision Screening: Recommended annual ophthalmology exams for early detection of glaucoma and other disorders of the eye. Is the patient up to date with their annual eye exam?  Yes  Who is the provider or what is the name of the office in which the patient attends annual eye exams? [place] If pt is not established with a provider, would they like to be referred to a provider to establish care? No .   Dental Screening: Recommended annual dental exams for proper oral hygiene  Community Resource Referral / Chronic Care Management: CRR required this visit?  No   CCM required this visit?  No      Plan:     I have personally reviewed and noted the following in the patient's chart:   Medical and social history Use of alcohol, tobacco or illicit drugs  Current medications and supplements including opioid prescriptions. Patient is not currently taking opioid prescriptions. Functional ability and status Nutritional status Physical activity Advanced directives List of other physicians Hospitalizations, surgeries, and ER visits in previous 12 months Vitals Screenings to include cognitive, depression, and falls Referrals and appointments  In  addition, I have reviewed and discussed with patient certain preventive protocols, quality metrics, and best practice recommendations. A written personalized care plan for preventive services as well as general preventive health recommendations were provided to patient.   I have read this note and agree with its contents. Alysia Penna, MD   Kellie Simmering,  LPN   25/00/3704   Nurse Notes: 6 CIT not administered. Patient appears cognitive per direct conversation.

## 2020-12-31 NOTE — Patient Instructions (Signed)
Benjamin [name] , Thank you for taking time to come for your Medicare Wellness Visit. I appreciate your ongoing commitment to your health goals. Please review the following plan we discussed and let me know if I can assist you in the future.   Screening recommendations/referrals: Colonoscopy: not required Recommended yearly ophthalmology/optometry visit for glaucoma screening and checkup Recommended yearly dental visit for hygiene and checkup  Vaccinations: Influenza vaccine: completed 12/13/2020 Pneumococcal vaccine: completed 11/06/2013 Tdap vaccine: due Shingles vaccine: discussed, needs second dose   Covid-19:  12/13/2020, 11/06/2019, 03/16/2019, 02/23/2019  Advanced directives: Please bring a copy of your POA (Power of Attorney) and/or Living Will to your next appointment.   Conditions/risks identified: none  Next appointment: Follow up in one year for your annual wellness visit.   Preventive Care 79 Years and Older, Male Preventive care refers to lifestyle choices and visits with your health care provider that can promote health and wellness. What does preventive care include? A yearly physical exam. This is also called an annual well check. Dental exams once or twice a year. Routine eye exams. Ask your health care provider how often you should have your eyes checked. Personal lifestyle choices, including: Daily care of your teeth and gums. Regular physical activity. Eating a healthy diet. Avoiding tobacco and drug use. Limiting alcohol use. Practicing safe sex. Taking low doses of aspirin every day. Taking vitamin and mineral supplements as recommended by your health care provider. What happens during an annual well check? The services and screenings done by your health care provider during your annual well check will depend on your 80, overall health, lifestyle risk factors, and family history of disease. Counseling  Your health care provider may ask you questions about  your: Alcohol use. Tobacco use. Drug use. Emotional well-being. Home and relationship well-being. Sexual activity. Eating habits. History of falls. Memory and ability to understand (cognition). Work and work Statistician. Screening  You may have the following tests or measurements: Height, weight, and BMI. Blood pressure. Lipid and cholesterol levels. These may be checked every 5 years, or more frequently if you are over 79 years old. Skin check. Lung cancer screening. You may have this screening every year starting at age 79 if you have a 30-pack-year history of smoking and currently smoke or have quit within the past 15 years. Fecal occult blood test (FOBT) of the stool. You may have this test every year starting at age 79. Flexible sigmoidoscopy or colonoscopy. You may have a sigmoidoscopy every 5 years or a colonoscopy every 10 years starting at age 79. Prostate cancer screening. Recommendations will vary depending on your family history and other risks. Hepatitis C blood test. Hepatitis B blood test. Sexually transmitted disease (STD) testing. Diabetes screening. This is done by checking your blood sugar (glucose) after you have not eaten for a while (fasting). You may have this done every 1-3 years. Abdominal aortic aneurysm (AAA) screening. You may need this if you are a current or former smoker. Osteoporosis. You may be screened starting at age 79 if you are at high risk. Talk with your health care provider about your test results, treatment options, and if necessary, the need for more tests. Vaccines  Your health care provider may recommend certain vaccines, such as: Influenza vaccine. This is recommended every year. Tetanus, diphtheria, and acellular pertussis (Tdap, Td) vaccine. You may need a Td booster every 10 years. Zoster vaccine. You may need this after age 79. Pneumococcal 13-valent conjugate (PCV13) vaccine. One dose is  recommended after age 79. Pneumococcal  polysaccharide (PPSV23) vaccine. One dose is recommended after age 79. Talk to your health care provider about which screenings and vaccines you need and how often you need them. This information is not intended to replace advice given to you by your health care provider. Make sure you discuss any questions you have with your health care provider. Document Released: 02/22/2015 Document Revised: 10/16/2015 Document Reviewed: 11/27/2014 Elsevier Interactive Patient Education  2017 [place] Prevention in the Home Falls can cause injuries. They can happen to people of all ages. There are many things you can do to make your home safe and to help prevent falls. What can I do on the outside of my home? Regularly fix the edges of walkways and driveways and fix any cracks. Remove anything that might make you trip as you walk through a door, such as a raised step or threshold. Trim any bushes or trees on the path to your home. Use bright outdoor lighting. Clear any walking paths of anything that might make someone trip, such as rocks or tools. Regularly check to see if handrails are loose or broken. Make sure that both sides of any steps have handrails. Any raised decks and porches should have guardrails on the edges. Have any leaves, snow, or ice cleared regularly. Use sand or salt on walking paths during winter. Clean up any spills in your garage right away. This includes oil or grease spills. What can I do in the bathroom? Use night lights. Install grab bars by the toilet and in the tub and shower. Do not use towel bars as grab bars. Use non-skid mats or decals in the tub or shower. If you need to sit down in the shower, use a plastic, non-slip stool. Keep the floor dry. Clean up any water that spills on the floor as soon as it happens. Remove soap buildup in the tub or shower regularly. Attach bath mats securely with double-sided non-slip rug tape. Do not have throw rugs and other  things on the floor that can make you trip. What can I do in the bedroom? Use night lights. Make sure that you have a light by your bed that is easy to reach. Do not use any sheets or blankets that are too big for your bed. They should not hang down onto the floor. Have a firm chair that has side arms. You can use this for support while you get dressed. Do not have throw rugs and other things on the floor that can make you trip. What can I do in the kitchen? Clean up any spills right away. Avoid walking on wet floors. Keep items that you use a lot in easy-to-reach places. If you need to reach something above you, use a strong step stool that has a grab bar. Keep electrical cords out of the way. Do not use floor polish or wax that makes floors slippery. If you must use wax, use non-skid floor wax. Do not have throw rugs and other things on the floor that can make you trip. What can I do with my stairs? Do not leave any items on the stairs. Make sure that there are handrails on both sides of the stairs and use them. Fix handrails that are broken or loose. Make sure that handrails are as long as the stairways. Check any carpeting to make sure that it is firmly attached to the stairs. Fix any carpet that is loose or worn. Avoid having  throw rugs at the top or bottom of the stairs. If you do have throw rugs, attach them to the floor with carpet tape. Make sure that you have a light switch at the top of the stairs and the bottom of the stairs. If you do not have them, ask someone to add them for you. What else can I do to help prevent falls? Wear shoes that: Do not have high heels. Have rubber bottoms. Are comfortable and fit you well. Are closed at the toe. Do not wear sandals. If you use a stepladder: Make sure that it is fully opened. Do not climb a closed stepladder. Make sure that both sides of the stepladder are locked into place. Ask someone to hold it for you, if possible. Clearly  mark and make sure that you can see: Any grab bars or handrails. First and last steps. Where the edge of each step is. Use tools that help you move around (mobility aids) if they are needed. These include: Canes. Walkers. Scooters. Crutches. Turn on the lights when you go into a dark area. Replace any light bulbs as soon as they burn out. Set up your furniture so you have a clear path. Avoid moving your furniture around. If any of your floors are uneven, fix them. If there are any pets around you, be aware of where they are. Review your medicines with your doctor. Some medicines can make you feel dizzy. This can increase your chance of falling. Ask your doctor what other things that you can do to help prevent falls. This information is not intended to replace advice given to you by your health care provider. Make sure you discuss any questions you have with your health care provider. Document Released: 11/22/2008 Document Revised: 07/04/2015 Document Reviewed: 03/02/2014 Elsevier Interactive Patient Education  2017 [employer].

## 2021-01-01 ENCOUNTER — Ambulatory Visit: Payer: Medicare Other

## 2021-01-01 ENCOUNTER — Other Ambulatory Visit: Payer: Medicare Other

## 2021-01-08 ENCOUNTER — Other Ambulatory Visit (INDEPENDENT_AMBULATORY_CARE_PROVIDER_SITE_OTHER): Payer: Medicare Other

## 2021-01-08 DIAGNOSIS — E785 Hyperlipidemia, unspecified: Secondary | ICD-10-CM

## 2021-01-08 LAB — LIPID PANEL
Cholesterol: 172 mg/dL (ref 0–200)
HDL: 53.2 mg/dL (ref >39.00)
LDL Cholesterol: 102 mg/dL — ABNORMAL HIGH (ref 0–99)
NonHDL: 119.25
Total CHOL/HDL Ratio: 3
Triglycerides: 86 mg/dL (ref 0.0–149.0)
VLDL: 17.2 mg/dL (ref 0.0–40.0)

## 2021-02-09 HISTORY — PX: EYE SURGERY: SHX253

## 2021-02-13 DIAGNOSIS — H401132 Primary open-angle glaucoma, bilateral, moderate stage: Secondary | ICD-10-CM | POA: Diagnosis not present

## 2021-02-13 DIAGNOSIS — Z961 Presence of intraocular lens: Secondary | ICD-10-CM | POA: Diagnosis not present

## 2021-02-13 DIAGNOSIS — H04129 Dry eye syndrome of unspecified lacrimal gland: Secondary | ICD-10-CM | POA: Diagnosis not present

## 2021-02-13 DIAGNOSIS — I10 Essential (primary) hypertension: Secondary | ICD-10-CM | POA: Diagnosis not present

## 2021-02-20 ENCOUNTER — Other Ambulatory Visit: Payer: Self-pay | Admitting: Family Medicine

## 2021-03-03 ENCOUNTER — Other Ambulatory Visit: Payer: Self-pay | Admitting: Family Medicine

## 2021-03-04 ENCOUNTER — Other Ambulatory Visit: Payer: Self-pay | Admitting: Family Medicine

## 2021-03-04 NOTE — Telephone Encounter (Signed)
Pt LOV was on 10/02/2020 Last refill was done on 07/15/2020 Please advise

## 2021-03-20 DIAGNOSIS — H02831 Dermatochalasis of right upper eyelid: Secondary | ICD-10-CM | POA: Diagnosis not present

## 2021-03-20 DIAGNOSIS — H57813 Brow ptosis, bilateral: Secondary | ICD-10-CM | POA: Diagnosis not present

## 2021-03-20 DIAGNOSIS — H02834 Dermatochalasis of left upper eyelid: Secondary | ICD-10-CM | POA: Diagnosis not present

## 2021-03-20 DIAGNOSIS — D485 Neoplasm of uncertain behavior of skin: Secondary | ICD-10-CM | POA: Diagnosis not present

## 2021-03-22 ENCOUNTER — Other Ambulatory Visit: Payer: Self-pay | Admitting: Family Medicine

## 2021-03-24 NOTE — Telephone Encounter (Signed)
Last refill- 09/10/20-120 tabs, 5 refills Last OV- 10/02/20  No future OV scheduled.

## 2021-03-27 DIAGNOSIS — I10 Essential (primary) hypertension: Secondary | ICD-10-CM | POA: Diagnosis not present

## 2021-03-27 DIAGNOSIS — Z961 Presence of intraocular lens: Secondary | ICD-10-CM | POA: Diagnosis not present

## 2021-03-27 DIAGNOSIS — H401132 Primary open-angle glaucoma, bilateral, moderate stage: Secondary | ICD-10-CM | POA: Diagnosis not present

## 2021-03-28 ENCOUNTER — Encounter: Payer: Self-pay | Admitting: Family Medicine

## 2021-03-28 ENCOUNTER — Ambulatory Visit (INDEPENDENT_AMBULATORY_CARE_PROVIDER_SITE_OTHER): Payer: Medicare Other | Admitting: Family Medicine

## 2021-03-28 VITALS — BP 110/60 | HR 66 | Temp 98.6°F | Wt 211.1 lb

## 2021-03-28 DIAGNOSIS — E785 Hyperlipidemia, unspecified: Secondary | ICD-10-CM

## 2021-03-28 DIAGNOSIS — L821 Other seborrheic keratosis: Secondary | ICD-10-CM

## 2021-03-28 LAB — LIPID PANEL
Cholesterol: 181 mg/dL (ref 0–200)
HDL: 63.6 mg/dL (ref >39.00)
LDL Cholesterol: 109 mg/dL — ABNORMAL HIGH (ref 0–99)
NonHDL: 117.23
Total CHOL/HDL Ratio: 3
Triglycerides: 40 mg/dL (ref 0.0–149.0)
VLDL: 8 mg/dL (ref 0.0–40.0)

## 2021-03-28 LAB — HEPATIC FUNCTION PANEL
ALT: 19 U/L (ref 0–53)
AST: 22 U/L (ref 0–37)
Albumin: 4.3 g/dL (ref 3.5–5.2)
Alkaline Phosphatase: 53 U/L (ref 39–117)
Bilirubin, Direct: 0.1 mg/dL (ref 0.0–0.3)
Total Bilirubin: 0.7 mg/dL (ref 0.2–1.2)
Total Protein: 6.4 g/dL (ref 6.0–8.3)

## 2021-03-28 NOTE — Progress Notes (Signed)
° °  Subjective:    Patient ID: Benjamin [name], male    DOB: August 22, 1940, 81 y.o.   MRN: [id number]  HPI Here to follow up on his lipids and to check a lesion on his scalp that he noticed a few months ago. This does not bother him. He is watching his diet and exercising. He feels well.    Review of Systems  Constitutional: Negative.   Respiratory: Negative.    Cardiovascular: Negative.       Objective:   Physical Exam Constitutional:      Appearance: Normal appearance.  Cardiovascular:     Rate and Rhythm: Normal rate and regular rhythm.     Pulses: Normal pulses.     Heart sounds: Normal heart sounds.  Pulmonary:     Effort: Pulmonary effort is normal.     Breath sounds: Normal breath sounds.  Skin:    Comments: The lesion on his right temple is raised, well marginated and has a uniform dark color. He has several others like this on his trunk   Neurological:     Mental Status: He is alert.          Assessment & Plan:  For the dyslipidemia, we will get fasting labs today. The skin lesion is a seborrheic keratosis. I explained this is benign and does not require treatment.  Alysia Penna, MD

## 2021-04-16 DIAGNOSIS — M7712 Lateral epicondylitis, left elbow: Secondary | ICD-10-CM | POA: Diagnosis not present

## 2021-04-16 DIAGNOSIS — M25522 Pain in left elbow: Secondary | ICD-10-CM | POA: Diagnosis not present

## 2021-04-16 DIAGNOSIS — M25531 Pain in right wrist: Secondary | ICD-10-CM | POA: Diagnosis not present

## 2021-04-16 DIAGNOSIS — M1811 Unilateral primary osteoarthritis of first carpometacarpal joint, right hand: Secondary | ICD-10-CM | POA: Diagnosis not present

## 2021-04-29 DIAGNOSIS — M1811 Unilateral primary osteoarthritis of first carpometacarpal joint, right hand: Secondary | ICD-10-CM | POA: Diagnosis not present

## 2021-04-29 DIAGNOSIS — M19031 Primary osteoarthritis, right wrist: Secondary | ICD-10-CM | POA: Diagnosis not present

## 2021-04-29 DIAGNOSIS — M778 Other enthesopathies, not elsewhere classified: Secondary | ICD-10-CM | POA: Diagnosis not present

## 2021-05-30 ENCOUNTER — Encounter: Payer: Medicare Other | Admitting: Family Medicine

## 2021-06-02 ENCOUNTER — Encounter: Payer: Self-pay | Admitting: Family Medicine

## 2021-06-02 ENCOUNTER — Ambulatory Visit (INDEPENDENT_AMBULATORY_CARE_PROVIDER_SITE_OTHER): Payer: Medicare Other | Admitting: Family Medicine

## 2021-06-02 VITALS — BP 120/60 | HR 50 | Temp 98.0°F | Ht 71.0 in | Wt 210.0 lb

## 2021-06-02 DIAGNOSIS — K219 Gastro-esophageal reflux disease without esophagitis: Secondary | ICD-10-CM

## 2021-06-02 DIAGNOSIS — G8929 Other chronic pain: Secondary | ICD-10-CM

## 2021-06-02 DIAGNOSIS — G4700 Insomnia, unspecified: Secondary | ICD-10-CM

## 2021-06-02 DIAGNOSIS — N401 Enlarged prostate with lower urinary tract symptoms: Secondary | ICD-10-CM | POA: Diagnosis not present

## 2021-06-02 DIAGNOSIS — E119 Type 2 diabetes mellitus without complications: Secondary | ICD-10-CM | POA: Diagnosis not present

## 2021-06-02 DIAGNOSIS — F418 Other specified anxiety disorders: Secondary | ICD-10-CM

## 2021-06-02 DIAGNOSIS — N138 Other obstructive and reflux uropathy: Secondary | ICD-10-CM | POA: Diagnosis not present

## 2021-06-02 DIAGNOSIS — I10 Essential (primary) hypertension: Secondary | ICD-10-CM

## 2021-06-02 DIAGNOSIS — E785 Hyperlipidemia, unspecified: Secondary | ICD-10-CM

## 2021-06-02 DIAGNOSIS — F411 Generalized anxiety disorder: Secondary | ICD-10-CM | POA: Diagnosis not present

## 2021-06-02 DIAGNOSIS — R972 Elevated prostate specific antigen [PSA]: Secondary | ICD-10-CM

## 2021-06-02 DIAGNOSIS — M25551 Pain in right hip: Secondary | ICD-10-CM | POA: Diagnosis not present

## 2021-06-02 DIAGNOSIS — N529 Male erectile dysfunction, unspecified: Secondary | ICD-10-CM

## 2021-06-02 LAB — LIPID PANEL
Cholesterol: 184 mg/dL (ref 0–200)
HDL: 52.2 mg/dL (ref >39.00)
LDL Cholesterol: 113 mg/dL — ABNORMAL HIGH (ref 0–99)
NonHDL: 132.02
Total CHOL/HDL Ratio: 4
Triglycerides: 97 mg/dL (ref 0.0–149.0)
VLDL: 19.4 mg/dL (ref 0.0–40.0)

## 2021-06-02 LAB — CBC WITH DIFFERENTIAL/PLATELET
Basophils Absolute: 0 10*3/uL (ref 0.0–0.1)
Basophils Relative: 0.5 % (ref 0.0–3.0)
Eosinophils Absolute: 0.1 10*3/uL (ref 0.0–0.7)
Eosinophils Relative: 1.6 % (ref 0.0–5.0)
HCT: 42.8 % (ref 39.0–52.0)
Hemoglobin: 14.6 g/dL (ref 13.0–17.0)
Lymphocytes Relative: 34.4 % (ref 12.0–46.0)
Lymphs Abs: 1.6 10*3/uL (ref 0.7–4.0)
MCHC: 34.2 g/dL (ref 30.0–36.0)
MCV: 91.9 fl (ref 78.0–100.0)
Monocytes Absolute: 0.4 10*3/uL (ref 0.1–1.0)
Monocytes Relative: 9.1 % (ref 3.0–12.0)
Neutro Abs: 2.5 10*3/uL (ref 1.4–7.7)
Neutrophils Relative %: 54.4 % (ref 43.0–77.0)
Platelets: 135 10*3/uL — ABNORMAL LOW (ref 150.0–400.0)
RBC: 4.66 Mil/uL (ref 4.22–5.81)
RDW: 13.7 % (ref 11.5–15.5)
WBC: 4.6 10*3/uL (ref 4.0–10.5)

## 2021-06-02 LAB — BASIC METABOLIC PANEL
BUN: 17 mg/dL (ref 6–23)
CO2: 28 mEq/L (ref 19–32)
Calcium: 9.5 mg/dL (ref 8.4–10.5)
Chloride: 99 mEq/L (ref 96–112)
Creatinine, Ser: 1.04 mg/dL (ref 0.40–1.50)
GFR: 67.93 mL/min (ref >60.00)
Glucose, Bld: 118 mg/dL — ABNORMAL HIGH (ref 70–99)
Potassium: 4.2 mEq/L (ref 3.5–5.1)
Sodium: 134 mEq/L — ABNORMAL LOW (ref 135–145)

## 2021-06-02 LAB — HEPATIC FUNCTION PANEL
ALT: 21 U/L (ref 0–53)
AST: 20 U/L (ref 0–37)
Albumin: 4.4 g/dL (ref 3.5–5.2)
Alkaline Phosphatase: 62 U/L (ref 39–117)
Bilirubin, Direct: 0.1 mg/dL (ref 0.0–0.3)
Total Bilirubin: 0.8 mg/dL (ref 0.2–1.2)
Total Protein: 6.6 g/dL (ref 6.0–8.3)

## 2021-06-02 LAB — TSH: TSH: 2.48 u[IU]/mL (ref 0.35–5.50)

## 2021-06-02 LAB — HEMOGLOBIN A1C: Hgb A1c MFr Bld: 5.4 % (ref 4.6–6.5)

## 2021-06-02 LAB — PSA: PSA: 10.13 ng/mL — ABNORMAL HIGH (ref 0.10–4.00)

## 2021-06-02 NOTE — Progress Notes (Signed)
? ?Subjective:  ? ? Patient ID: Benjamin [name], male    DOB: [birth year], 81 y.o.   MRN: [id number] ? ?HPI ?Here to follow up on issues. He is doing well in general. He walks 2 miles every day with his dog and he works out at [employer] 3-4 days a week. After losing weight a few years ago he has maintained this. His BP is stable. His depression and anxiety are stable, and he has been dating some recently. His GERD is stable. He sleeps well.  ? ? ?Review of Systems  ?Constitutional: Negative.   ?HENT: Negative.    ?Eyes: Negative.   ?Respiratory: Negative.    ?Cardiovascular: Negative.   ?Gastrointestinal: Negative.   ?Genitourinary: Negative.   ?Musculoskeletal: Negative.   ?Skin: Negative.   ?Neurological: Negative.   ?Psychiatric/Behavioral: Negative.    ? ?   ?Objective:  ? Physical Exam ?Constitutional:   ?   General: He is not in acute distress. ?   Appearance: Normal appearance. He is well-developed. He is not diaphoretic.  ?HENT:  ?   Head: Normocephalic and atraumatic.  ?   Right Ear: External ear normal.  ?   Left Ear: External ear normal.  ?   Nose: Nose normal.  ?   Mouth/Throat:  ?   Pharynx: No oropharyngeal exudate.  ?Eyes:  ?   General: No scleral icterus.    ?   Right eye: No discharge.     ?   Left eye: No discharge.  ?   Conjunctiva/sclera: Conjunctivae normal.  ?   Pupils: Pupils are equal, round, and reactive to light.  ?Neck:  ?   Thyroid: No thyromegaly.  ?   Vascular: No JVD.  ?   Trachea: No tracheal deviation.  ?Cardiovascular:  ?   Rate and Rhythm: Normal rate and regular rhythm.  ?   Heart sounds: Normal heart sounds. No murmur heard. ?  No friction rub. No gallop.  ?Pulmonary:  ?   Effort: Pulmonary effort is normal. No respiratory distress.  ?   Breath sounds: Normal breath sounds. No wheezing or rales.  ?Chest:  ?   Chest wall: No tenderness.  ?Abdominal:  ?   General: Bowel sounds are normal. There is no distension.  ?   Palpations: Abdomen is soft. There is no mass.  ?   Tenderness: There is  no abdominal tenderness. There is no guarding or rebound.  ?Genitourinary: ?   Penis: No tenderness.   ?Musculoskeletal:     ?   General: No tenderness. Normal range of motion.  ?   Cervical back: Neck supple.  ?Lymphadenopathy:  ?   Cervical: No cervical adenopathy.  ?Skin: ?   General: Skin is warm and dry.  ?   Coloration: Skin is not pale.  ?   Findings: No erythema or rash.  ?Neurological:  ?   Mental Status: He is alert and oriented to person, place, and time.  ?   Cranial Nerves: No cranial nerve deficit.  ?   Motor: No abnormal muscle tone.  ?   Coordination: Coordination normal.  ?   Deep Tendon Reflexes: Reflexes are normal and symmetric. Reflexes normal.  ?Psychiatric:     ?   Behavior: Behavior normal.     ?   Thought Content: Thought content normal.     ?   Judgment: Judgment normal.  ? ? ? ? ? ?   ?Assessment & Plan:  ?His HTN and GERD are stable. His  depression and anxiety are well controlled. His BPH is stable and his insomnia is not an issue currently. We will get fasting labs to check lipids, PSA,  A1c, etc. We spent a total of (  33 ) minutes reviewing records and discussing these issues.  ?Alysia Penna, MD ? ? ? ?

## 2021-06-07 ENCOUNTER — Encounter: Payer: Self-pay | Admitting: Family Medicine

## 2021-06-09 ENCOUNTER — Other Ambulatory Visit: Payer: Self-pay

## 2021-06-09 DIAGNOSIS — E785 Hyperlipidemia, unspecified: Secondary | ICD-10-CM

## 2021-06-09 DIAGNOSIS — H53482 Generalized contraction of visual field, left eye: Secondary | ICD-10-CM | POA: Diagnosis not present

## 2021-06-09 DIAGNOSIS — H53481 Generalized contraction of visual field, right eye: Secondary | ICD-10-CM | POA: Diagnosis not present

## 2021-06-09 MED ORDER — ATORVASTATIN CALCIUM 20 MG PO TABS: 20.0000 mg | ORAL_TABLET | Freq: Every day | ORAL | 3 refills | Status: DC

## 2021-06-09 NOTE — Telephone Encounter (Signed)
Tell him I think that is a great idea. We will increase this to 20 mg daily. Call in #[phone] with 3 rf  ?

## 2021-06-16 ENCOUNTER — Other Ambulatory Visit: Payer: Self-pay | Admitting: Family Medicine

## 2021-07-24 DIAGNOSIS — Z961 Presence of intraocular lens: Secondary | ICD-10-CM | POA: Diagnosis not present

## 2021-07-24 DIAGNOSIS — H401132 Primary open-angle glaucoma, bilateral, moderate stage: Secondary | ICD-10-CM | POA: Diagnosis not present

## 2021-07-26 ENCOUNTER — Other Ambulatory Visit: Payer: Self-pay | Admitting: Family Medicine

## 2021-08-14 DIAGNOSIS — D485 Neoplasm of uncertain behavior of skin: Secondary | ICD-10-CM | POA: Diagnosis not present

## 2021-08-14 DIAGNOSIS — H02831 Dermatochalasis of right upper eyelid: Secondary | ICD-10-CM | POA: Diagnosis not present

## 2021-08-14 DIAGNOSIS — H57813 Brow ptosis, bilateral: Secondary | ICD-10-CM | POA: Diagnosis not present

## 2021-08-14 DIAGNOSIS — H02834 Dermatochalasis of left upper eyelid: Secondary | ICD-10-CM | POA: Diagnosis not present

## 2021-08-21 DIAGNOSIS — D485 Neoplasm of uncertain behavior of skin: Secondary | ICD-10-CM | POA: Diagnosis not present

## 2021-08-21 DIAGNOSIS — D22111 Melanocytic nevi of right upper eyelid, including canthus: Secondary | ICD-10-CM | POA: Diagnosis not present

## 2021-08-25 ENCOUNTER — Other Ambulatory Visit: Payer: Self-pay | Admitting: Family Medicine

## 2021-09-03 DIAGNOSIS — M1711 Unilateral primary osteoarthritis, right knee: Secondary | ICD-10-CM | POA: Diagnosis not present

## 2021-09-03 DIAGNOSIS — Z96652 Presence of left artificial knee joint: Secondary | ICD-10-CM | POA: Diagnosis not present

## 2021-09-06 ENCOUNTER — Other Ambulatory Visit: Payer: Self-pay | Admitting: Family Medicine

## 2021-09-27 ENCOUNTER — Other Ambulatory Visit: Payer: Self-pay | Admitting: Family Medicine

## 2021-09-29 ENCOUNTER — Other Ambulatory Visit: Payer: Self-pay | Admitting: Family Medicine

## 2021-09-29 NOTE — Telephone Encounter (Signed)
Last OV- 06/02/21 Last refill--03/04/21--30 tabs, 5 refills  No future OV scheduled.

## 2021-09-29 NOTE — Telephone Encounter (Signed)
Last OV- 06/02/21 Last refill- 03/25/21-120 tabs, 5 refills  No future OV scheduled.

## 2021-09-30 ENCOUNTER — Other Ambulatory Visit: Payer: Self-pay | Admitting: Family Medicine

## 2021-09-30 DIAGNOSIS — H02413 Mechanical ptosis of bilateral eyelids: Secondary | ICD-10-CM | POA: Diagnosis not present

## 2021-09-30 DIAGNOSIS — H53483 Generalized contraction of visual field, bilateral: Secondary | ICD-10-CM | POA: Diagnosis not present

## 2021-09-30 DIAGNOSIS — H57813 Brow ptosis, bilateral: Secondary | ICD-10-CM | POA: Diagnosis not present

## 2021-09-30 DIAGNOSIS — H02831 Dermatochalasis of right upper eyelid: Secondary | ICD-10-CM | POA: Diagnosis not present

## 2021-09-30 DIAGNOSIS — H02834 Dermatochalasis of left upper eyelid: Secondary | ICD-10-CM | POA: Diagnosis not present

## 2021-09-30 NOTE — Telephone Encounter (Signed)
Last Rx given on 1/24 for #30 with 5 ref

## 2021-10-01 ENCOUNTER — Other Ambulatory Visit: Payer: Self-pay | Admitting: Family Medicine

## 2021-10-01 NOTE — Telephone Encounter (Signed)
Pharmacy stated that they have never received this prescription.   Please resend. Thanks.

## 2021-10-01 NOTE — Telephone Encounter (Signed)
Last refill- 03/04/21--30 capsules, 5 refills Last OV- 06/02/21  No future OV scheduled.   Can this patient receive a refill?

## 2021-10-02 ENCOUNTER — Other Ambulatory Visit: Payer: Self-pay | Admitting: Family Medicine

## 2021-10-02 ENCOUNTER — Telehealth: Payer: Self-pay | Admitting: Family Medicine

## 2021-10-02 NOTE — Telephone Encounter (Signed)
Last refill-03/04/21--30 cap,5 refills. No future OV scheduled.

## 2021-10-02 NOTE — Telephone Encounter (Signed)
Brooke from [facility] called to say the following prescription keeps getting denied:  temazepam (RESTORIL) 30 MG capsule   Pt's LOV:  06/02/2021  They are wondering if MD knows reason for denial?  Should Pt be scheduled for an OV?  Please advise.    [place], [place] Phone:  [phone]  Fax:  [phone]

## 2021-10-03 MED ORDER — TEMAZEPAM 30 MG PO CAPS
ORAL_CAPSULE | ORAL | 5 refills | Status: DC
Start: 2021-10-03 — End: 2022-04-21

## 2021-10-03 NOTE — Telephone Encounter (Signed)
I have no idea why it was denied, bu I just refilled it

## 2021-10-03 NOTE — Telephone Encounter (Signed)
[facility] prescription was received, once refill pharmacy will contact patient for pick up.

## 2021-11-03 ENCOUNTER — Other Ambulatory Visit: Payer: Self-pay | Admitting: Family Medicine

## 2021-11-20 DIAGNOSIS — H401132 Primary open-angle glaucoma, bilateral, moderate stage: Secondary | ICD-10-CM | POA: Diagnosis not present

## 2021-11-29 ENCOUNTER — Other Ambulatory Visit: Payer: Self-pay | Admitting: Family Medicine

## 2021-12-09 ENCOUNTER — Other Ambulatory Visit: Payer: Medicare Other | Admitting: Family Medicine

## 2021-12-12 DIAGNOSIS — M1711 Unilateral primary osteoarthritis, right knee: Secondary | ICD-10-CM | POA: Diagnosis not present

## 2021-12-30 ENCOUNTER — Other Ambulatory Visit: Payer: Self-pay | Admitting: Family Medicine

## 2022-01-06 ENCOUNTER — Ambulatory Visit (INDEPENDENT_AMBULATORY_CARE_PROVIDER_SITE_OTHER): Payer: Medicare Other

## 2022-01-06 ENCOUNTER — Ambulatory Visit: Payer: Medicare Other

## 2022-01-06 VITALS — Ht 71.0 in | Wt 215.0 lb

## 2022-01-06 DIAGNOSIS — Z0000 Encounter for general adult medical examination without abnormal findings: Secondary | ICD-10-CM | POA: Diagnosis not present

## 2022-01-06 NOTE — Patient Instructions (Addendum)
Benjamin [name] , Thank you for taking time to come for your Medicare Wellness Visit. I appreciate your ongoing commitment to your health goals. Please review the following plan we discussed and let me know if I can assist you in the future.   These are the goals we discussed:  Goals       Exercise 150 min/wk Moderate Activity      Patient      Discussed learning about the long term care system; Recommended seeking an expert in long term care for information on Medicaid law and resources for those who are disabled.       Patient Stated      12/31/2020, wants to get weight to 195 pounds      Stay Healthy (pt-stated)        This is a list of the screening recommended for you and due dates:  Health Maintenance  Topic Date Due   Hemoglobin A1C  12/02/2021   Yearly kidney health urinalysis for diabetes  01/07/2022*   COVID-19 Vaccine (5 - 2023-24 season) 01/22/2022*   Zoster (Shingles) Vaccine (2 of 2) 04/08/2022*   Flu Shot  05/10/2022*   Complete foot exam   06/09/2022*   Eye exam for diabetics  05/08/2022   Yearly kidney function blood test for diabetes  06/03/2022   Medicare Annual Wellness Visit  01/07/2023   Pneumonia Vaccine  Completed   HPV Vaccine  Aged Out   Colon Cancer Screening  Discontinued  *Topic was postponed. The date shown is not the original due date.    Advanced directives: Please bring a copy of your health care power of attorney and living will to the office to be added to your chart at your convenience.   Conditions/risks identified: None  Next appointment: Follow up in one year for your annual wellness visit.   Preventive Care 80 Years and Older, Male  Preventive care refers to lifestyle choices and visits with your health care provider that can promote health and wellness. What does preventive care include? A yearly physical exam. This is also called an annual well check. Dental exams once or twice a year. Routine eye exams. Ask your health care  provider how often you should have your eyes checked. Personal lifestyle choices, including: Daily care of your teeth and gums. Regular physical activity. Eating a healthy diet. Avoiding tobacco and drug use. Limiting alcohol use. Practicing safe sex. Taking low doses of aspirin every day. Taking vitamin and mineral supplements as recommended by your health care provider. What happens during an annual well check? The services and screenings done by your health care provider during your annual well check will depend on your age, overall health, lifestyle risk factors, and family history of disease. Counseling  Your health care provider may ask you questions about your: Alcohol use. Tobacco use. Drug use. Emotional well-being. Home and relationship well-being. Sexual activity. Eating habits. History of falls. Memory and ability to understand (cognition). Work and work Statistician. Screening  You may have the following tests or measurements: Height, weight, and BMI. Blood pressure. Lipid and cholesterol levels. These may be checked every 5 years, or more frequently if you are over 2 years old. Skin check. Lung cancer screening. You may have this screening every year starting at age 74 if you have a 30-pack-year history of smoking and currently smoke or have quit within the past 15 years. Fecal occult blood test (FOBT) of the stool. You may have this test every year starting  at age 29. Flexible sigmoidoscopy or colonoscopy. You may have a sigmoidoscopy every 5 years or a colonoscopy every 10 years starting at age 21. Prostate cancer screening. Recommendations will vary depending on your family history and other risks. Hepatitis C blood test. Hepatitis B blood test. Sexually transmitted disease (STD) testing. Diabetes screening. This is done by checking your blood sugar (glucose) after you have not eaten for a while (fasting). You may have this done every 1-3 years. Abdominal aortic  aneurysm (AAA) screening. You may need this if you are a current or former smoker. Osteoporosis. You may be screened starting at age 25 if you are at high risk. Talk with your health care provider about your test results, treatment options, and if necessary, the need for more tests. Vaccines  Your health care provider may recommend certain vaccines, such as: Influenza vaccine. This is recommended every year. Tetanus, diphtheria, and acellular pertussis (Tdap, Td) vaccine. You may need a Td booster every 10 years. Zoster vaccine. You may need this after age 27. Pneumococcal 13-valent conjugate (PCV13) vaccine. One dose is recommended after age 28. Pneumococcal polysaccharide (PPSV23) vaccine. One dose is recommended after age 58. Talk to your health care provider about which screenings and vaccines you need and how often you need them. This information is not intended to replace advice given to you by your health care provider. Make sure you discuss any questions you have with your health care provider. Document Released: 02/22/2015 Document Revised: 10/16/2015 Document Reviewed: 11/27/2014 Elsevier Interactive Patient Education  2017 [place] Prevention in the Home Falls can cause injuries. They can happen to people of all ages. There are many things you can do to make your home safe and to help prevent falls. What can I do on the outside of my home? Regularly fix the edges of walkways and driveways and fix any cracks. Remove anything that might make you trip as you walk through a door, such as a raised step or threshold. Trim any bushes or trees on the path to your home. Use bright outdoor lighting. Clear any walking paths of anything that might make someone trip, such as rocks or tools. Regularly check to see if handrails are loose or broken. Make sure that both sides of any steps have handrails. Any raised decks and porches should have guardrails on the edges. Have any leaves,  snow, or ice cleared regularly. Use sand or salt on walking paths during winter. Clean up any spills in your garage right away. This includes oil or grease spills. What can I do in the bathroom? Use night lights. Install grab bars by the toilet and in the tub and shower. Do not use towel bars as grab bars. Use non-skid mats or decals in the tub or shower. If you need to sit down in the shower, use a plastic, non-slip stool. Keep the floor dry. Clean up any water that spills on the floor as soon as it happens. Remove soap buildup in the tub or shower regularly. Attach bath mats securely with double-sided non-slip rug tape. Do not have throw rugs and other things on the floor that can make you trip. What can I do in the bedroom? Use night lights. Make sure that you have a light by your bed that is easy to reach. Do not use any sheets or blankets that are too big for your bed. They should not hang down onto the floor. Have a firm chair that has side arms. You  can use this for support while you get dressed. Do not have throw rugs and other things on the floor that can make you trip. What can I do in the kitchen? Clean up any spills right away. Avoid walking on wet floors. Keep items that you use a lot in easy-to-reach places. If you need to reach something above you, use a strong step stool that has a grab bar. Keep electrical cords out of the way. Do not use floor polish or wax that makes floors slippery. If you must use wax, use non-skid floor wax. Do not have throw rugs and other things on the floor that can make you trip. What can I do with my stairs? Do not leave any items on the stairs. Make sure that there are handrails on both sides of the stairs and use them. Fix handrails that are broken or loose. Make sure that handrails are as long as the stairways. Check any carpeting to make sure that it is firmly attached to the stairs. Fix any carpet that is loose or worn. Avoid having throw  rugs at the top or bottom of the stairs. If you do have throw rugs, attach them to the floor with carpet tape. Make sure that you have a light switch at the top of the stairs and the bottom of the stairs. If you do not have them, ask someone to add them for you. What else can I do to help prevent falls? Wear shoes that: Do not have high heels. Have rubber bottoms. Are comfortable and fit you well. Are closed at the toe. Do not wear sandals. If you use a stepladder: Make sure that it is fully opened. Do not climb a closed stepladder. Make sure that both sides of the stepladder are locked into place. Ask someone to hold it for you, if possible. Clearly mark and make sure that you can see: Any grab bars or handrails. First and last steps. Where the edge of each step is. Use tools that help you move around (mobility aids) if they are needed. These include: Canes. Walkers. Scooters. Crutches. Turn on the lights when you go into a dark area. Replace any light bulbs as soon as they burn out. Set up your furniture so you have a clear path. Avoid moving your furniture around. If any of your floors are uneven, fix them. If there are any pets around you, be aware of where they are. Review your medicines with your doctor. Some medicines can make you feel dizzy. This can increase your chance of falling. Ask your doctor what other things that you can do to help prevent falls. This information is not intended to replace advice given to you by your health care provider. Make sure you discuss any questions you have with your health care provider. Document Released: 11/22/2008 Document Revised: 07/04/2015 Document Reviewed: 03/02/2014 Elsevier Interactive Patient Education  2017 [employer].

## 2022-01-06 NOTE — Progress Notes (Signed)
Subjective:   Benjamin [name] is a 81 y.o. male who presents for Medicare Annual/Subsequent preventive examination.  Review of Systems    Virtual Visit via Telephone Note  I connected with  Benjamin [name] on 01/06/22 at 11:15 AM EST by telephone and verified that I am speaking with the correct person using two identifiers.  Location: Patient: Home Provider: Office [name]/Nurse Health Advisor   I discussed the limitations, risks, security and privacy concerns of performing an evaluation and management service by telephone and the availability of in person appointments. The patient expressed understanding and agreed to proceed.  Interactive audio and video telecommunications were attempted between this nurse and patient, however failed, due to patient having technical difficulties OR patient did not have access to video capability.  We continued and completed visit with audio only.  Some vital signs may be absent or patient reported.   Criselda Peaches, LPN  Cardiac Risk Factors include: advanced age (>44mn, >>20women);hypertension;male gender     Objective:    Today's Vitals   01/06/22 1128  Weight: 215 lb (97.5 kg)  Height: '5\' 11"'$  (1.803 m)   Body mass index is 29.99 kg/m.     01/06/2022   11:39 AM 12/31/2020   10:54 AM 12/08/2019    3:44 PM 01/05/2017    1:00 PM 01/05/2017    7:57 AM 12/29/2016    2:53 PM 04/24/2016   10:30 AM  Advanced Directives  Does Patient Have a Medical Advance Directive? Yes Yes Yes Yes Yes Yes Yes  Type of AParamedicof [place]ancho [place]Living will [place] will [place] will Living will;Healthcare Power of Attorney Living will;Healthcare Power of Attorney Living will;Healthcare Power of Attorney   Does patient want to make changes to medical advance directive?   No - Patient declined No - Patient declined No - Patient declined No -  Patient declined   Copy of HCadein Chart? No - copy requested No - copy requested Yes - validated most recent copy scanned in chart (See row information) No - copy requested No - copy requested No - copy requested     Current Medications (verified) Outpatient Encounter Medications as of 01/06/2022  Medication Sig   alprazolam (XANAX) 2 MG tablet TAKE ONE TABLET BY MOUTH EVERY 6 HOURS AS NEEDED FOR ANXIETY   atorvastatin (LIPITOR) 20 MG tablet Take 1 tablet (20 mg total) by mouth daily.   latanoprost (XALATAN) 0.005 % ophthalmic solution SMARTSIG:In Eye(s)   lisinopril-hydrochlorothiazide (ZESTORETIC) 10-12.5 MG tablet TAKE ONE TABLET BY MOUTH DAILY   Multiple Vitamins-Minerals (PRESERVISION AREDS PO) Take by mouth.   naproxen sodium (ALEVE) 220 MG tablet Take 220 mg by mouth.   omeprazole (PRILOSEC) 40 MG capsule TAKE ONE CAPSULE BY MOUTH TWICE DAILY   polyethylene glycol (MIRALAX / GLYCOLAX) packet Take 17 g by mouth 2 (two) times daily.   tadalafil (CIALIS) 20 MG tablet Take 1 tablet (20 mg total) by mouth daily as needed for erectile dysfunction.   tamsulosin (FLOMAX) 0.4 MG CAPS capsule TAKE ONE CAPSULE BY MOUTH DAILY   temazepam (RESTORIL) 30 MG capsule TAKE ONE CAPSULE BY MOUTH AT BEDTIME AS NEEDED SLEEP   No facility-administered encounter medications on file as of 01/06/2022.    Allergies (verified) Codeine, Diclofenac, and Norco [hydrocodone-acetaminophen]   History: Past Medical History:  Diagnosis Date   Allergy    Anxiety    Arthritis  BPH (benign prostatic hyperplasia)    with elevated PSA, sees Dr. Diona Fanti, benign   Cataract    Elevated PSA    sees Dr. Diona Fanti    GERD (gastroesophageal reflux disease)    Hyperlipidemia    Hypertension    Insomnia    Platelets decreased ([place])    Pneumonia    walking PNA   Pre-diabetes    No meds   Past Surgical History:  Procedure Laterality Date   CHOLECYSTECTOMY N/A 01/10/2014   Procedure:  LAPAROSCOPIC CHOLECYSTECTOMY WITH INTRAOPERATIVE CHOLANGIOGRAM;  Surgeon: Pedro Earls, MD;  Location: [facility];  Service: General;  Laterality: N/A;   COLONOSCOPY  08/03/2018   per Dr. Carlean Purl, adenomatous polyps, no repeats needed    JOINT REPLACEMENT     Right hip arthroplasty Dr. Alvan Dame 01/05/17    left knee arthroscopy      LIPOSUCTION  1987   prostate biopsies in 2003 and again 05/2008     per Dr. Diona Fanti. benign   REFRACTIVE SURGERY     repair  of large umblicial hernia     per Dr. 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   Procedure: LEFT TOTAL KNEE ARTHROPLASTY;  Surgeon: Paralee Cancel, MD;  Location: [facility];  Service: Orthopedics;  Laterality: Left;   Family History  Problem Relation Age of Onset   Alzheimer's disease Other    Colon cancer Neg Hx    Esophageal cancer Neg Hx    Pancreatic cancer Neg Hx    Prostate cancer Neg Hx    Rectal cancer Neg Hx    Stomach cancer Neg Hx    Social History   Socioeconomic History   Marital status: Widowed    Spouse name: Not on file   Number of children: Not on file   Years of education: Not on file   Highest education level: Not on file  Occupational History   Not on file  Tobacco Use   Smoking status: Former    Packs/day: 1.50    Types: Cigarettes    Quit date: 02/09/1974    Years since quitting: 47.9   Smokeless tobacco: Never   Tobacco comments:    quit over 46 yo, absolutely smoked more than 100 cig; referred for AAA   Vaping Use   Vaping Use: Never used  Substance and Sexual Activity   Alcohol use: Yes    Alcohol/week: 7.0 standard drinks of alcohol    Types: 7 Shots of liquor per week    Comment: daily   Drug use: Yes    Types: Marijuana     Comment: occas uses 1 time every other month - this past weekend   Sexual activity: Not Currently  Other Topics Concern   Not on file  Social History Narrative   Lives with spouse who had progressive disease   24/7 care   2 children   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      Social Determinants of Health   Financial Resource Strain: Low Risk  (01/06/2022)   Overall Financial Resource Strain (CARDIA)    Difficulty of Paying Living Expenses: Not hard at all  Food Insecurity: No Food Insecurity (01/06/2022)   Hunger Vital Sign  Worried About Charity fundraiser in the Last Year: Never true    [place] in the Last Year: Never true  Transportation Needs: No Transportation Needs (01/06/2022)   PRAPARE - Hydrologist (Medical): No    Lack of Transportation (Non-Medical): No  Physical Activity: Sufficiently Active (01/06/2022)   Exercise Vital Sign    Days of Exercise per Week: 7 days    Minutes of Exercise per Session: 100 min  Stress: No Stress Concern Present (01/06/2022)   [place]    Feeling of Stress : Not at all  Social Connections: Socially Isolated (01/06/2022)   Social Connection and Isolation Panel [NHANES]    Frequency of Communication with Friends and Family: More than three times a week    Frequency of Social Gatherings with Friends and Family: More than three times a week    Attends Religious Services: Never    Marine scientist or Organizations: No    Attends Archivist Meetings: Never    Marital Status: Widowed    Tobacco Counseling Counseling given: Not Answered Tobacco comments: quit over 29 yo, absolutely smoked more than 100 cig; referred for AAA    Clinical Intake:  Pre-visit preparation completed: No  Pain : No/denies pain     BMI - recorded: 29.99 Nutritional Status: BMI 25 -29 Overweight Nutritional Risks: None Diabetes:  No  How often do you need to have someone help you when you read instructions, pamphlets, or other written materials from your doctor or pharmacy?: 1 - Never  Diabetic?  No  Interpreter Needed?: No  Information entered by :: Rolene Arbour LPN   Activities of Daily Living    01/06/2022   11:38 AM  In your present state of health, do you have any difficulty performing the following activities:  Hearing? 1  Comment Wears hearing aids  Vision? 0  Difficulty concentrating or making decisions? 0  Walking or climbing stairs? 0  Dressing or bathing? 0  Doing errands, shopping? 0  Preparing Food and eating ? N  Using the Toilet? N  In the past six months, have you accidently leaked urine? N  Do you have problems with loss of bowel control? N  Managing your Medications? N  Managing your Finances? N  Housekeeping or managing your Housekeeping? N    Patient Care Team: Laurey Morale, MD as PCP - General  Indicate any recent Medical Services you may have received from other than [facility] providers in the past year (date may be approximate).     Assessment:   This is a routine wellness examination for Benjamin [name].  Hearing/Vision screen Hearing Screening - Comments:: Wears hearing aids Vision Screening - Comments:: - up to date with routine eye exams with  [place] issues and exercise activities discussed: Current Exercise Habits: Home exercise routine, Type of exercise: walking, Time (Minutes): > 60, Frequency (Times/Week): 7, Weekly Exercise (Minutes/Week): 0, Intensity: Moderate, Exercise limited by: None identified   Goals Addressed               This Visit's Progress     Stay Healthy (pt-stated)         Depression Screen    01/06/2022   11:35 AM 06/02/2021   11:05 AM 03/28/2021   10:03 AM 12/31/2020   10:55 AM 05/29/2020   10:59 AM 12/08/2019    3:47 PM 11/27/2019   11:09  AM  PHQ 2/9 Scores  PHQ - 2 Score 0 2 1 0 0 1 1  PHQ- 9 Score  '4 1  1 2 1   '$ Exception Documentation       Medical reason    Fall Risk    01/06/2022   11:38 AM 12/31/2021   11:55 AM 06/02/2021   11:05 AM 03/28/2021   10:02 AM 12/31/2020   10:55 AM  Fall Risk   Falls in the past year? 0 0 0 0 0  Number falls in past yr: 0  0 0   Injury with Fall? 0  0 0   Risk for fall due to : No Fall Risks  No Fall Risks No Fall Risks Medication side effect  Follow up Falls prevention discussed  Falls evaluation completed  Falls evaluation completed;Education provided;Falls prevention discussed    FALL RISK PREVENTION PERTAINING TO THE HOME:  Any stairs in or around the home? Yes  If so, are there any without handrails? No  Home free of loose throw rugs in walkways, pet beds, electrical cords, etc? Yes  Adequate lighting in your home to reduce risk of falls? Yes   ASSISTIVE DEVICES UTILIZED TO PREVENT FALLS:  Life alert? No  Use of a cane, walker or w/c? No  Grab bars in the bathroom? Yes Shower chair or bench in shower? Yes Elevated toilet seat or a handicapped toilet? No   TIMED UP AND GO:  Was the test performed? No . Audio Visit   Cognitive Function:        01/06/2022   11:40 AM  6CIT Screen  What Year? 0 points  What month? 0 points  What time? 0 points  Count back from 20 0 points  Months in reverse 0 points  Repeat phrase 0 points  Total Score 0 points    Immunizations Immunization History  Administered Date(s) Administered   Fluad Quad(high Dose 65+) 10/11/2018   Influenza Whole 03/04/2009   Influenza, High Dose Seasonal PF 12/30/2016, 11/12/2017, 12/13/2020   Influenza,inj,Quad PF,6+ Mos 11/06/2013   Influenza-Unspecified 11/19/2014, 11/10/2015, 11/10/2015   PFIZER(Purple Top)SARS-COV-2 Vaccination 02/23/2019, 03/16/2019, 11/06/2019, 12/13/2020   PPD Test 07/18/2014   Pfizer Covid-19 Vaccine Bivalent Booster 84yr & up 12/13/2020   Pneumococcal Conjugate-13 11/06/2013   Pneumococcal Polysaccharide-23 03/04/2009   Zoster Recombinat  (Shingrix) 01/28/2017      Flu Vaccine status: Up to date  Pneumococcal vaccine status: Up to date  Covid-19 vaccine status: Completed vaccines  Qualifies for Shingles Vaccine? Yes   Zostavax completed Yes   Shingrix Completed?: Yes  Screening Tests Health Maintenance  Topic Date Due   HEMOGLOBIN A1C  12/02/2021   Diabetic kidney evaluation - Urine ACR  01/07/2022 (Originally 02/25/1958)   COVID-19 Vaccine (5 - 2023-24 season) 01/22/2022 (Originally 10/10/2021)   Zoster Vaccines- Shingrix (2 of 2) 04/08/2022 (Originally 03/25/2017)   INFLUENZA VACCINE  05/10/2022 (Originally 09/09/2021)   FOOT EXAM  06/09/2022 (Originally 02/25/1950)   OPHTHALMOLOGY EXAM  05/08/2022   Diabetic kidney evaluation - GFR measurement  06/03/2022   Medicare Annual Wellness (AWV)  01/07/2023   Pneumonia Vaccine 80 Years old  Completed   HPV VACCINES  Aged Out   COLONOSCOPY (Pts 45-449yrInsurance coverage will need to be confirmed)  Discontinued    Health Maintenance  Health Maintenance Due  Topic Date Due   HEMOGLOBIN A1C  12/02/2021    Colorectal cancer screening: No longer required.   Lung Cancer Screening: (Low Dose CT Chest recommended if Age  55-80 years, 30 pack-year currently smoking OR have quit w/in 15years.) does not qualify.     Additional Screening:  Hepatitis C Screening: does not qualify; Completed   Vision Screening: Recommended annual ophthalmology exams for early detection of glaucoma and other disorders of the eye. Is the patient up to date with their annual eye exam?  Yes  Who is the provider or what is the name of the office in which the patient attends annual eye exams? [facility] If pt is not established with a provider, would they like to be referred to a provider to establish care? No .   Dental Screening: Recommended annual dental exams for proper oral hygiene  Community Resource Referral / Chronic Care Management:  CRR required this visit?  No   CCM required  this visit?  No      Plan:     I have personally reviewed and noted the following in the patient's chart:   Medical and social history Use of alcohol, tobacco or illicit drugs  Current medications and supplements including opioid prescriptions. Patient is not currently taking opioid prescriptions. Functional ability and status Nutritional status Physical activity Advanced directives List of other physicians Hospitalizations, surgeries, and ER visits in previous 12 months Vitals Screenings to include cognitive, depression, and falls Referrals and appointments  In addition, I have reviewed and discussed with patient certain preventive protocols, quality metrics, and best practice recommendations. A written personalized care plan for preventive services as well as general preventive health recommendations were provided to patient.     Criselda Peaches, LPN   [id number]   Nurse Notes: Patient due Diabetic Kidney Evaluation-Urine ACR and Hemoglobin A1C

## 2022-01-30 ENCOUNTER — Encounter: Payer: Medicare Other | Admitting: Family Medicine

## 2022-02-10 ENCOUNTER — Ambulatory Visit: Payer: BC Managed Care – PPO | Admitting: Family Medicine

## 2022-02-10 ENCOUNTER — Other Ambulatory Visit: Payer: Self-pay | Admitting: Family Medicine

## 2022-02-10 ENCOUNTER — Encounter: Payer: Self-pay | Admitting: Family Medicine

## 2022-02-10 VITALS — BP 118/60 | HR 82 | Temp 97.9°F | Wt 213.0 lb

## 2022-02-10 DIAGNOSIS — E119 Type 2 diabetes mellitus without complications: Secondary | ICD-10-CM | POA: Diagnosis not present

## 2022-02-10 DIAGNOSIS — I10 Essential (primary) hypertension: Secondary | ICD-10-CM | POA: Diagnosis not present

## 2022-02-10 DIAGNOSIS — R252 Cramp and spasm: Secondary | ICD-10-CM | POA: Diagnosis not present

## 2022-02-10 DIAGNOSIS — N529 Male erectile dysfunction, unspecified: Secondary | ICD-10-CM | POA: Diagnosis not present

## 2022-02-10 MED ORDER — ALPRAZOLAM 1 MG PO TABS: 1.0000 mg | ORAL_TABLET | Freq: Four times a day (QID) | ORAL | 5 refills | Status: DC | PRN

## 2022-02-10 NOTE — Progress Notes (Signed)
   Subjective:    Patient ID: Benjamin [name], male    DOB: 1941/01/18, 82 y.o.   MRN: [id number]  HPI Here to discuss several issues. First he has been dating a woman that he is very happy with, and they have been enjoying sex with each other. However his erections are not what he would like. He uses Cialis , and it helps to some extent. He asks to get some labs today to rule out other physical problem. He also mentions having muscle cramps lately. These often occur at night.    Review of Systems  Constitutional: Negative.   Respiratory: Negative.    Cardiovascular: Negative.        Objective:   Physical Exam Constitutional:      Appearance: Normal appearance.  Cardiovascular:     Rate and Rhythm: Normal rate and regular rhythm.     Pulses: Normal pulses.     Heart sounds: Normal heart sounds.  Pulmonary:     Effort: Pulmonary effort is normal.     Breath sounds: Normal breath sounds.  Neurological:     Mental Status: He is alert.           Assessment & Plan:  For the ED, we will get labs today including a testosterone level. He wants to stick with Cialis for the time being, but I reminded him that he has not tried Viagra yet. For the muscle cramps, we will check electrolytes and magnesium levels.  Alysia Penna, MD

## 2022-02-11 LAB — BASIC METABOLIC PANEL
BUN: 21 mg/dL (ref 6–23)
CO2: 26 mEq/L (ref 19–32)
Calcium: 9.9 mg/dL (ref 8.4–10.5)
Chloride: 97 mEq/L (ref 96–112)
Creatinine, Ser: 1.12 mg/dL (ref 0.40–1.50)
GFR: 61.85 mL/min (ref >60.00)
Glucose, Bld: 118 mg/dL — ABNORMAL HIGH (ref 70–99)
Potassium: 4.1 mEq/L (ref 3.5–5.1)
Sodium: 134 mEq/L — ABNORMAL LOW (ref 135–145)

## 2022-02-11 LAB — CBC WITH DIFFERENTIAL/PLATELET
Basophils Absolute: 0 10*3/uL (ref 0.0–0.1)
Basophils Relative: 0.8 % (ref 0.0–3.0)
Eosinophils Absolute: 0.1 10*3/uL (ref 0.0–0.7)
Eosinophils Relative: 1.7 % (ref 0.0–5.0)
HCT: 43.9 % (ref 39.0–52.0)
Hemoglobin: 14.8 g/dL (ref 13.0–17.0)
Lymphocytes Relative: 17.3 % (ref 12.0–46.0)
Lymphs Abs: 1.1 10*3/uL (ref 0.7–4.0)
MCHC: 33.8 g/dL (ref 30.0–36.0)
MCV: 93.6 fl (ref 78.0–100.0)
Monocytes Absolute: 0.7 10*3/uL (ref 0.1–1.0)
Monocytes Relative: 10.7 % (ref 3.0–12.0)
Neutro Abs: 4.6 10*3/uL (ref 1.4–7.7)
Neutrophils Relative %: 69.5 % (ref 43.0–77.0)
Platelets: 161 10*3/uL (ref 150.0–400.0)
RBC: 4.69 Mil/uL (ref 4.22–5.81)
RDW: 14.2 % (ref 11.5–15.5)
WBC: 6.6 10*3/uL (ref 4.0–10.5)

## 2022-02-11 LAB — MAGNESIUM: Magnesium: 1.7 mg/dL (ref 1.5–2.5)

## 2022-02-11 LAB — TESTOSTERONE: Testosterone: 302.92 ng/dL (ref 300.00–890.00)

## 2022-02-11 LAB — TSH: TSH: 1.93 u[IU]/mL (ref 0.35–5.50)

## 2022-02-18 ENCOUNTER — Encounter: Payer: Self-pay | Admitting: Family Medicine

## 2022-02-18 MED ORDER — TESTOSTERONE CYPIONATE 200 MG/ML IM SOLN: 200.0000 mg | INTRAMUSCULAR | 2 refills | Status: DC

## 2022-02-18 MED ORDER — SILDENAFIL CITRATE 100 MG PO TABS: 100.0000 mg | ORAL_TABLET | ORAL | 11 refills | Status: DC | PRN

## 2022-02-18 MED ORDER — "SYRINGE 22G X 1-1/2"" 3 ML MISC": 1.0000 | 0 refills | Status: DC

## 2022-02-18 NOTE — Telephone Encounter (Signed)
I sent in Viagra and testosterone shots for him to try

## 2022-04-03 ENCOUNTER — Other Ambulatory Visit: Payer: Self-pay | Admitting: Family Medicine

## 2022-04-03 ENCOUNTER — Encounter: Payer: Self-pay | Admitting: Family Medicine

## 2022-04-06 NOTE — Telephone Encounter (Signed)
yes he can take 1/2 a tablet twice a day every day if he wishes (just no more than a total of 100 mg a day)

## 2022-04-14 ENCOUNTER — Encounter: Payer: Self-pay | Admitting: Family Medicine

## 2022-04-14 NOTE — Telephone Encounter (Signed)
I have no thoughts per se. He needs to weigh the pros and the cons and decide which one he wants to take

## 2022-04-17 ENCOUNTER — Encounter: Payer: Self-pay | Admitting: Adult Health

## 2022-04-17 ENCOUNTER — Telehealth (INDEPENDENT_AMBULATORY_CARE_PROVIDER_SITE_OTHER): Payer: Medicare Other | Admitting: Adult Health

## 2022-04-17 VITALS — Temp 100.5°F | Ht 71.0 in | Wt 213.0 lb

## 2022-04-17 DIAGNOSIS — U071 COVID-19: Secondary | ICD-10-CM

## 2022-04-17 MED ORDER — NIRMATRELVIR/RITONAVIR (PAXLOVID)TABLET: 3.0000 | ORAL_TABLET | Freq: Two times a day (BID) | ORAL | 0 refills | Status: AC

## 2022-04-17 NOTE — Progress Notes (Signed)
Virtual Visit via Video Note  I connected with Benjamin [name]  on 04/17/22 at  2:45 PM EST by a video enabled telemedicine application and verified that I am speaking with the correct person using two identifiers.  Location patient: home Location provider:work or home office [name], provider  I discussed the limitations of evaluation and management by telemedicine and the availability of in person appointments. The patient expressed understanding and agreed to proceed.   HPI: 82 year old male who is being evaluated today for an acute issue.  He reports that "his lady friend" tested positive for COVID-19 10 days ago.  He kept testing throughout the week and tested negative until this morning when he woke up feeling ill and had a fever up to 100.5 F.  Symptoms currently are weakness and fatigue.  He has not had any chest pain or shortness of breath.  He is interested in Paxlovid   ROS: See pertinent positives and negatives per HPI.  Past Medical History:  Diagnosis Date   Allergy    Anxiety    Arthritis    BPH (benign prostatic hyperplasia)    with elevated PSA, sees Dr. Diona Fanti, benign   Cataract    Elevated PSA    sees Dr. Diona Fanti    GERD (gastroesophageal reflux disease)    Hyperlipidemia    Hypertension    Insomnia    Platelets decreased ([place])    Pneumonia    walking PNA   Pre-diabetes    No meds    Past Surgical History:  Procedure Laterality Date   CHOLECYSTECTOMY N/A 01/10/2014   Procedure: LAPAROSCOPIC CHOLECYSTECTOMY WITH INTRAOPERATIVE CHOLANGIOGRAM;  Surgeon: Pedro Earls, MD;  Location: [facility];  Service: General;  Laterality: N/A;   COLONOSCOPY  08/03/2018   per Dr. Carlean Purl, adenomatous polyps, no repeats needed    JOINT REPLACEMENT     Right hip arthroplasty Dr. Alvan Dame 01/05/17    left knee arthroscopy      LIPOSUCTION  1987   prostate biopsies in 2003 and again 05/2008     per Dr. Diona Fanti. benign    REFRACTIVE SURGERY     repair  of large umblicial hernia     per Dr. 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   Procedure: LEFT TOTAL KNEE ARTHROPLASTY;  Surgeon: Paralee Cancel, MD;  Location: [facility];  Service: Orthopedics;  Laterality: Left;    Family History  Problem Relation Age of Onset   Alzheimer's disease Other    Colon cancer Neg Hx    Esophageal cancer Neg Hx    Pancreatic cancer Neg Hx    Prostate cancer Neg Hx    Rectal cancer Neg Hx    Stomach cancer Neg Hx        Current Outpatient Medications:    ALPRAZolam (XANAX) 1 MG tablet, Take 1 tablet (1 mg total) by mouth every 6 (six) hours as needed for anxiety., Disp: 120 tablet, Rfl: 5   atorvastatin (LIPITOR) 20 MG tablet, Take 1 tablet (20 mg total) by mouth daily., Disp: 90 tablet, Rfl: 3   latanoprost (XALATAN) 0.005 % ophthalmic solution, SMARTSIG:In Eye(s), Disp: , Rfl:    lisinopril-hydrochlorothiazide (ZESTORETIC) 10-12.5 MG  tablet, TAKE ONE TABLET BY MOUTH DAILY, Disp: 90 tablet, Rfl: 0   Multiple Vitamins-Minerals (PRESERVISION AREDS PO), Take by mouth., Disp: , Rfl:    naproxen sodium (ALEVE) 220 MG tablet, Take 220 mg by mouth., Disp: , Rfl:    nirmatrelvir/ritonavir (PAXLOVID) 20 x 150 MG & 10 x '100MG'$  TABS, Take 3 tablets by mouth 2 (two) times daily for 5 days. (Take nirmatrelvir 150 mg two tablets twice daily for 5 days and ritonavir 100 mg one tablet twice daily for 5 days) Patient GFR is 61, Disp: 30 tablet, Rfl: 0   omeprazole (PRILOSEC) 40 MG capsule, TAKE ONE CAPSULE BY MOUTH TWICE DAILY, Disp: 180 capsule, Rfl: 0   polyethylene glycol (MIRALAX / GLYCOLAX) packet, Take 17 g by mouth 2 (two) times daily., Disp: 14 each, Rfl: 0   sildenafil  (VIAGRA) 100 MG tablet, Take 1 tablet (100 mg total) by mouth as needed for erectile dysfunction., Disp: 4 tablet, Rfl: 11   Syringe/Needle, Disp, (SYRINGE 3CC/22GX1-1/2") 22G X 1-1/2" 3 ML MISC, 1 Application by Does not apply route every 7 (seven) days., Disp: 50 each, Rfl: 0   tamsulosin (FLOMAX) 0.4 MG CAPS capsule, TAKE ONE CAPSULE BY MOUTH DAILY, Disp: 90 capsule, Rfl: 0   temazepam (RESTORIL) 30 MG capsule, TAKE ONE CAPSULE BY MOUTH AT BEDTIME AS NEEDED SLEEP, Disp: 30 capsule, Rfl: 5   testosterone cypionate (DEPOTESTOSTERONE CYPIONATE) 200 MG/ML injection, Inject 1 mL (200 mg total) into the muscle every 7 (seven) days., Disp: 10 mL, Rfl: 2  EXAM:  VITALS per patient if applicable:  GENERAL: alert, oriented, appears well and in no acute distress  HEENT: atraumatic, conjunttiva clear, no obvious abnormalities on inspection of external nose and ears  NECK: normal movements of the head and neck  LUNGS: on inspection no signs of respiratory distress, breathing rate appears normal, no obvious gross SOB, gasping or wheezing  CV: no obvious cyanosis  MS: moves all visible extremities without noticeable abnormality  PSYCH/NEURO: pleasant and cooperative, no obvious depression or anxiety, speech and thought processing grossly intact  ASSESSMENT AND PLAN:  Discussed the following assessment and plan:  1. COVID-19 virus infection - nirmatrelvir/ritonavir (PAXLOVID) 20 x 150 MG & 10 x '100MG'$  TABS; Take 3 tablets by mouth 2 (two) times daily for 5 days. (Take nirmatrelvir 150 mg two tablets twice daily for 5 days and ritonavir 100 mg one tablet twice daily for 5 days) Patient GFR is 61  Dispense: 30 tablet; Refill: 0      I discussed the assessment and treatment plan with the patient. The patient was provided an opportunity to ask questions and all were answered. The patient agreed with the plan and demonstrated an understanding of the instructions.   The patient was advised to call  back or seek an in-person evaluation if the symptoms worsen or if the condition fails to improve as anticipated.   Dorothyann Peng, NP

## 2022-04-18 ENCOUNTER — Other Ambulatory Visit: Payer: Self-pay | Admitting: Family Medicine

## 2022-05-01 ENCOUNTER — Encounter: Payer: Self-pay | Admitting: Family Medicine

## 2022-05-01 ENCOUNTER — Ambulatory Visit (INDEPENDENT_AMBULATORY_CARE_PROVIDER_SITE_OTHER): Payer: Medicare Other | Admitting: Family Medicine

## 2022-05-01 VITALS — BP 130/78 | HR 61 | Temp 97.4°F | Wt 221.0 lb

## 2022-05-01 DIAGNOSIS — J020 Streptococcal pharyngitis: Secondary | ICD-10-CM

## 2022-05-01 DIAGNOSIS — R509 Fever, unspecified: Secondary | ICD-10-CM

## 2022-05-01 DIAGNOSIS — N529 Male erectile dysfunction, unspecified: Secondary | ICD-10-CM | POA: Diagnosis not present

## 2022-05-01 DIAGNOSIS — F5232 Male orgasmic disorder: Secondary | ICD-10-CM

## 2022-05-01 DIAGNOSIS — R059 Cough, unspecified: Secondary | ICD-10-CM

## 2022-05-01 LAB — POC COVID19 BINAXNOW: SARS Coronavirus 2 Ag: NEGATIVE

## 2022-05-01 LAB — POCT INFLUENZA A/B
Influenza A, POC: NEGATIVE
Influenza B, POC: NEGATIVE

## 2022-05-01 LAB — POCT RAPID STREP A (OFFICE): Rapid Strep A Screen: POSITIVE — AB

## 2022-05-01 MED ORDER — CEFUROXIME AXETIL 500 MG PO TABS: 500.0000 mg | ORAL_TABLET | Freq: Two times a day (BID) | ORAL | 0 refills | Status: AC

## 2022-05-01 MED ORDER — TADALAFIL 20 MG PO TABS: 20.0000 mg | ORAL_TABLET | ORAL | 11 refills | Status: DC | PRN

## 2022-05-01 NOTE — Telephone Encounter (Signed)
Error/njr °

## 2022-05-01 NOTE — Progress Notes (Signed)
   Subjective:    Patient ID: Benjamin [name], male    DOB: 19-Jun-1940, 82 y.o.   MRN: [id number]  HPI Here for several issues. First he has had 4 days of ST, dry cough, and fever. No SOB. Also he is very pleased with the testosterone treatments. He feels stronger and his libido is higher. He wants to stop Viagra however and go back to Cialis because it helped his erections better. He mention that despite all these improvements, he still cannot achieve orgasm. He has not been able to do this for some months now.    Review of Systems  Constitutional:  Positive for fever.  HENT:  Positive for congestion and sore throat. Negative for ear pain, postnasal drip and sinus pressure.   Respiratory:  Positive for cough. Negative for shortness of breath and wheezing.        Objective:   Physical Exam Constitutional:      Appearance: Normal appearance. He is not ill-appearing.  HENT:     Right Ear: Tympanic membrane, ear canal and external ear normal.     Left Ear: Tympanic membrane, ear canal and external ear normal.     Nose: Nose normal.     Mouth/Throat:     Pharynx: Oropharynx is clear.  Eyes:     Conjunctiva/sclera: Conjunctivae normal.  Pulmonary:     Effort: Pulmonary effort is normal.     Breath sounds: Normal breath sounds.  Lymphadenopathy:     Cervical: No cervical adenopathy.  Neurological:     Mental Status: He is alert.           Assessment & Plan:  He has strep pharyngitis and we will treat this with 10 days of Cefuroxime. His testosterone replacement seems to be successful, but he cannot climax. I think this could be a side effect of the Flomax. We will stop the Flomax and go back to Cialis. Recheck as needed.  Alysia Penna, MD

## 2022-05-04 ENCOUNTER — Encounter: Payer: Self-pay | Admitting: Family Medicine

## 2022-05-04 ENCOUNTER — Ambulatory Visit (INDEPENDENT_AMBULATORY_CARE_PROVIDER_SITE_OTHER): Payer: Medicare Other | Admitting: Family Medicine

## 2022-05-04 VITALS — BP 140/80 | HR 60 | Temp 97.7°F | Wt 219.0 lb

## 2022-05-04 DIAGNOSIS — Z01818 Encounter for other preprocedural examination: Secondary | ICD-10-CM | POA: Diagnosis not present

## 2022-05-04 DIAGNOSIS — M25561 Pain in right knee: Secondary | ICD-10-CM

## 2022-05-04 DIAGNOSIS — G8929 Other chronic pain: Secondary | ICD-10-CM | POA: Diagnosis not present

## 2022-05-04 NOTE — Progress Notes (Signed)
   Subjective:    Patient ID: Benjamin [name], male    DOB: 04-08-1940, 82 y.o.   MRN: [id number]  HPI Here for a preoperative exam. He is scheduled for a total arthroplasty on the right knee per Dr. Paralee Cancel on 06-16-22. Other than the knee pain he is doing well. We saw him on 05-01-22 for a strep throat infection, and this is being treated with Cefuroxime. He does not take any blood thinning medications .   Review of Systems  Constitutional: Negative.   HENT: Negative.    Eyes: Negative.   Respiratory: Negative.    Cardiovascular: Negative.   Gastrointestinal: Negative.   Genitourinary: Negative.   Musculoskeletal: Negative.   Skin: Negative.   Neurological: Negative.   Psychiatric/Behavioral: Negative.         Objective:   Physical Exam Constitutional:      Appearance: Normal appearance.  HENT:     Mouth/Throat:     Pharynx: Oropharynx is clear.  Eyes:     Conjunctiva/sclera: Conjunctivae normal.  Cardiovascular:     Rate and Rhythm: Normal rate and regular rhythm.     Pulses: Normal pulses.     Heart sounds: Normal heart sounds.  Pulmonary:     Effort: Pulmonary effort is normal.     Breath sounds: Normal breath sounds.  Abdominal:     General: Abdomen is flat. Bowel sounds are normal. There is no distension.     Palpations: Abdomen is soft. There is no mass.     Tenderness: There is no abdominal tenderness. There is no right CVA tenderness, left CVA tenderness, guarding or rebound.     Hernia: No hernia is present.  Lymphadenopathy:     Cervical: No cervical adenopathy.  Neurological:     General: No focal deficit present.     Mental Status: He is alert and oriented to person, place, and time.           Assessment & Plan:  He is recovering as expected from a strep infection. We will clear him for the surgery with no special instructions. We spent a total of (35   ) minutes reviewing records and discussing these issues.  Alysia Penna, MD

## 2022-05-14 DIAGNOSIS — H401132 Primary open-angle glaucoma, bilateral, moderate stage: Secondary | ICD-10-CM | POA: Diagnosis not present

## 2022-05-21 ENCOUNTER — Encounter: Payer: Self-pay | Admitting: Family Medicine

## 2022-05-25 ENCOUNTER — Other Ambulatory Visit: Payer: Self-pay | Admitting: Family Medicine

## 2022-05-25 ENCOUNTER — Encounter: Payer: Self-pay | Admitting: *Deleted

## 2022-05-25 MED ORDER — VARDENAFIL HCL 20 MG PO TABS: 20.0000 mg | ORAL_TABLET | Freq: Every day | ORAL | 5 refills | Status: DC | PRN

## 2022-05-25 NOTE — Telephone Encounter (Signed)
I sent in some Levitra that he can try

## 2022-05-27 NOTE — Progress Notes (Signed)
Sent message, via epic in basket, requesting orders in epic from surgeon.  

## 2022-05-29 DIAGNOSIS — M1711 Unilateral primary osteoarthritis, right knee: Secondary | ICD-10-CM | POA: Diagnosis not present

## 2022-06-01 ENCOUNTER — Other Ambulatory Visit: Payer: Self-pay | Admitting: Family Medicine

## 2022-06-02 NOTE — Patient Instructions (Signed)
SURGICAL WAITING ROOM VISITATION Patients having surgery or a procedure may have no more than 2 support people in the waiting area - these visitors may rotate in the visitor waiting room.   Due to an increase in RSV and influenza rates and associated hospitalizations, children ages 48 and under may not visit patients in [facility] hospitals. If the patient needs to stay at the hospital during part of their recovery, the visitor guidelines for inpatient rooms apply.  PRE-OP VISITATION  Pre-op nurse will coordinate an appropriate time for 1 support person to accompany the patient in pre-op.  This support person may not rotate.  This visitor will be contacted when the time is appropriate for the visitor to come back in the pre-op area.  Please refer to the [facility] website for the visitor guidelines for Inpatients (after your surgery is over and you are in a regular room).  You are not required to quarantine at this time prior to your surgery. However, you must do this: Hand Hygiene often Do NOT share personal items Notify your provider if you are in close contact with someone who has COVID or you develop fever 100.4 or greater, new onset of sneezing, cough, sore throat, shortness of breath or body aches.  If you test positive for Covid or have been in contact with anyone that has tested positive in the last 10 days please notify you surgeon.   Please call the following Hospital offices to complete your surgery registration:   PHARMACY (to reconcile your Medications and correct errors):                              [phone]    Your procedure is scheduled on:  Tuesday  Jun 16, 2022  Report to [facility] Main Entrance: [address] entrance where the [employer] is available.   Report to admitting at: 05:15  AM  Call this number if you have any questions or problems the morning of surgery [phone]  Do not eat food after Midnight the night prior to your  surgery/procedure.  After Midnight you may have the following liquids until  04:15 AM  DAY OF SURGERY  Clear Liquid Diet Water Black Coffee (sugar ok, NO MILK/CREAM OR CREAMERS)  Tea (sugar ok, NO MILK/CREAM OR CREAMERS) regular and decaf                             Plain Jell-O  with no fruit (NO RED)                                           Fruit ices (not with fruit pulp, NO RED)                                     Popsicles (NO RED)                                                                  Juice: apple, WHITE grape,  WHITE cranberry Sports drinks like Gatorade or Powerade (NO RED)                   The day of surgery:  Drink ONE (1) Pre-Surgery Clear Ensure  at  04:15 AM the morning of surgery. Drink in one sitting. Do not sip.  This drink was given to you during your hospital pre-op appointment visit. Nothing else to drink after completing the Pre-Surgery Clear Ensure  : No candy, chewing gum or throat lozenges.    FOLLOW  ANY ADDITIONAL PRE OP INSTRUCTIONS YOU RECEIVED FROM YOUR SURGEON'S OFFICE!!!   Oral Hygiene is also important to reduce your risk of infection.        Remember - BRUSH YOUR TEETH THE MORNING OF SURGERY WITH YOUR REGULAR TOOTHPASTE  Do NOT smoke after Midnight the night before surgery.  Take ONLY these medicines the morning of surgery with A SIP OF WATER:  omeprazole.  You may take the Alprazolam (Xanax) if needed. You may use your Eye drops if needed.    You may not have any metal on your body including  jewelry, and body piercing  Do not wear  lotions, powders, cologne, or deodorant  Men may shave face and neck.  Contacts, Hearing Aids, dentures or bridgework may not be worn into surgery.   You may bring a small overnight bag with you on the day of surgery, only pack items that are not valuable. [facility] IS NOT RESPONSIBLE   FOR VALUABLES THAT ARE LOST OR STOLEN.   Do not bring your home medications to the hospital. The Pharmacy will  dispense medications listed on your medication list to you during your admission in the Hospital.  Special Instructions: Bring a copy of your healthcare power of attorney and living will documents the day of surgery, if you wish to have them scanned into your [facility] Records- EPIC  Please read over the following fact sheets you were given: IF YOU HAVE QUESTIONS ABOUT YOUR PRE-OP INSTRUCTIONS, PLEASE CALL [phone].   +++++++ PLEASE FOLLOW THE ATTACHED INFORMATION REGARDING SHOWERING / BATHING SCHEDULE  PRIOR TO YOUR SURGERY. Start this schedule on :  Friday  Jun 12, 2022   ON THE DAY OF SURGERY : Do not apply any lotions/deodorants the morning of surgery.  Please wear clean clothes to the hospital/surgery center.    FAILURE TO FOLLOW THESE INSTRUCTIONS MAY RESULT IN THE CANCELLATION OF YOUR SURGERY  PATIENT SIGNATURE_________________________________  NURSE SIGNATURE__________________________________  ________________________________________________________________________        Rogelia Mire    An incentive spirometer is a tool that can help keep your lungs clear and active. This tool measures how well you are filling your lungs with each breath. Taking long deep breaths may help reverse or decrease the chance of developing breathing (pulmonary) problems (especially infection) following: A long period of time when you are unable to move or be active. BEFORE THE PROCEDURE  If the spirometer includes an indicator to show your best effort, your nurse or respiratory therapist will set it to a desired goal. If possible, sit up straight or lean slightly forward. Try not to slouch. Hold the incentive spirometer in an upright position. INSTRUCTIONS FOR USE  Sit on the edge of your bed if possible, or sit up as far as you can in bed or on a chair. Hold the incentive spirometer in an upright position. Breathe out normally. Place the mouthpiece in your mouth and  seal your lips tightly around  it. Breathe in slowly and as deeply as possible, raising the piston or the ball toward the top of the column. Hold your breath for 3-5 seconds or for as long as possible. Allow the piston or ball to fall to the bottom of the column. Remove the mouthpiece from your mouth and breathe out normally. Rest for a few seconds and repeat Steps 1 through 7 at least 10 times every 1-2 hours when you are awake. Take your time and take a few normal breaths between deep breaths. The spirometer may include an indicator to show your best effort. Use the indicator as a goal to work toward during each repetition. After each set of 10 deep breaths, practice coughing to be sure your lungs are clear. If you have an incision (the cut made at the time of surgery), support your incision when coughing by placing a pillow or rolled up towels firmly against it. Once you are able to get out of bed, walk around indoors and cough well. You may stop using the incentive spirometer when instructed by your caregiver.  RISKS AND COMPLICATIONS Take your time so you do not get dizzy or light-headed. If you are in pain, you may need to take or ask for pain medication before doing incentive spirometry. It is harder to take a deep breath if you are having pain. AFTER USE Rest and breathe slowly and easily. It can be helpful to keep track of a log of your progress. Your caregiver can provide you with a simple table to help with this. If you are using the spirometer at home, follow these instructions: SEEK MEDICAL CARE IF:  You are having difficultly using the spirometer. You have trouble using the spirometer as often as instructed. Your pain medication is not giving enough relief while using the spirometer. You develop fever of 100.5 F (38.1 C) or higher.                                                                                                    SEEK IMMEDIATE MEDICAL CARE IF:  You cough up bloody  sputum that had not been present before. You develop fever of 102 F (38.9 C) or greater. You develop worsening pain at or near the incision site. MAKE SURE YOU:  Understand these instructions. Will watch your condition. Will get help right away if you are not doing well or get worse. Document Released: 06/08/2006 Document Revised: 04/20/2011 Document Reviewed: 08/09/2006 [facility] Patient Information 2014 [place], [place].

## 2022-06-02 NOTE — Progress Notes (Signed)
COVID Vaccine received:   No  Yes Date of any COVID positive Test in last 90 days:  PCP - Gershon Crane, MD clearance in 05-04-22 note Cardiologist -   Chest x-ray - 11-08-2019  2v  Epic EKG -  12-29-2016 Epic  will repeat at PST Stress Test -  ECHO -  Cardiac Cath -   PCR screen:  Ordered & Completed             No Order but Needs PROFEND             N/A for this surgery  Surgery Plan:   Ambulatory                             Outpatient in bed                             Admit  Anesthesia:     General   Spinal                             Choice   MAC  Pacemaker / ICD device  No  Yes   Spinal Cord Stimulator:[x]  No  Yes       History of Sleep Apnea?  No  Yes   CPAP used?-  No  Yes    Does the patient monitor blood sugar?           No  Yes   N/A LAST 4 A1c have been normal. 06-02-2021 ? 5.4 Patient has:  NO Hx DM    Pre-DM                  DM1    DM2  Blood Thinner / Instructions:none Aspirin Instructions: none  ERAS Protocol Ordered:  No   Yes PRE-SURGERY  ENSURE   G2  Patient is to be NPO after: 04:15 am  Comments:   Activity level: Patient is able / unable to climb a flight of stairs without difficulty;  No CP   No SOB, but would have ___   Patient can / can not perform ADLs without assistance.   Anesthesia review: HTN, Anxiety,    Patient denies shortness of breath, fever, cough and chest pain at PAT appointment.  Patient verbalized understanding and agreement to the Pre-Surgical Instructions that were given to them at this PAT appointment. Patient was also educated of the need to review these PAT instructions again prior to his surgery.I reviewed the appropriate phone numbers to call if they have any and questions or concerns.

## 2022-06-03 ENCOUNTER — Encounter (HOSPITAL_COMMUNITY): Payer: Self-pay

## 2022-06-03 ENCOUNTER — Encounter (HOSPITAL_COMMUNITY)
Admission: RE | Admit: 2022-06-03 | Discharge: 2022-06-03 | Disposition: A | Discharge status: Home / Self Care | Payer: Medicare Other | Source: Ambulatory Visit | Attending: Orthopedic Surgery | Admitting: Orthopedic Surgery

## 2022-06-03 ENCOUNTER — Encounter: Payer: Self-pay | Admitting: Family Medicine

## 2022-06-03 ENCOUNTER — Other Ambulatory Visit: Payer: Self-pay

## 2022-06-03 VITALS — BP 138/62 | Temp 98.1°F | Resp 18 | Ht 71.0 in | Wt 217.0 lb

## 2022-06-03 DIAGNOSIS — R972 Elevated prostate specific antigen [PSA]: Secondary | ICD-10-CM

## 2022-06-03 DIAGNOSIS — I440 Atrioventricular block, first degree: Secondary | ICD-10-CM | POA: Insufficient documentation

## 2022-06-03 DIAGNOSIS — I10 Essential (primary) hypertension: Secondary | ICD-10-CM | POA: Insufficient documentation

## 2022-06-03 DIAGNOSIS — Z01818 Encounter for other preprocedural examination: Secondary | ICD-10-CM | POA: Insufficient documentation

## 2022-06-03 DIAGNOSIS — N138 Other obstructive and reflux uropathy: Secondary | ICD-10-CM

## 2022-06-03 DIAGNOSIS — N179 Acute kidney failure, unspecified: Secondary | ICD-10-CM

## 2022-06-03 LAB — CBC
HCT: 50.8 % (ref 39.0–52.0)
Hemoglobin: 16.7 g/dL (ref 13.0–17.0)
MCH: 31 pg (ref 26.0–34.0)
MCHC: 32.9 g/dL (ref 30.0–36.0)
MCV: 94.2 fL (ref 80.0–100.0)
Platelets: 131 10*3/uL — ABNORMAL LOW (ref 150–400)
RBC: 5.39 MIL/uL (ref 4.22–5.81)
RDW: 14.3 % (ref 11.5–15.5)
WBC: 6.2 10*3/uL (ref 4.0–10.5)
nRBC: 0 % (ref 0.0–0.2)

## 2022-06-03 LAB — BASIC METABOLIC PANEL
Anion gap: 9 (ref 5–15)
BUN: 28 mg/dL — ABNORMAL HIGH (ref 8–23)
CO2: 27 mmol/L (ref 22–32)
Calcium: 9.3 mg/dL (ref 8.9–10.3)
Chloride: 101 mmol/L (ref 98–111)
Creatinine, Ser: 1.74 mg/dL — ABNORMAL HIGH (ref 0.61–1.24)
GFR, Estimated: 39 mL/min — ABNORMAL LOW (ref >60)
Glucose, Bld: 111 mg/dL — ABNORMAL HIGH (ref 70–99)
Potassium: 4.8 mmol/L (ref 3.5–5.1)
Sodium: 137 mmol/L (ref 135–145)

## 2022-06-03 LAB — SURGICAL PCR SCREEN
MRSA, PCR: NEGATIVE
Staphylococcus aureus: NEGATIVE

## 2022-06-04 NOTE — Telephone Encounter (Signed)
I did the referral to Urology  

## 2022-06-09 NOTE — Progress Notes (Signed)
Case: [id number] Date/Time: 06/16/22 0700   Procedure: TOTAL KNEE ARTHROPLASTY (Right: Knee)   Anesthesia type: Spinal   Pre-op diagnosis: Right knee osteoarthritis   Location: [facility]OM 09 / [facility]   Surgeons: Durene Romans, MD       DISCUSSION: Benjamin [name] is an 82 year old male who presents to PAT clinic prior to R TKA. PMH significant for HTN, anxiety, thrombocytopenia.   Patient last saw his PCP on 05/04/2022 who states: "We will clear him for the surgery with no special instructions."  Patient denies any complaints at PAT visit.  His creatinine was noted to be 1.74 on screening labs.  Per chart review it appears patient may have urinary obstructive symptoms as he felt like he is not able to empty his bladder and requesting urology referral which was provided by his PCP.  Attempted to contact patient with phone number provided but was not able to talk to the patient directly.  I did leave a message. Also spoke with PCP to make him aware of results who stated to just increase fluid intake. Will put in orders to recollect BMP on day of surgery.   VS: BP 138/62 Comment: Right arm sitting  Temp 36.7 C (Oral)   Resp 18   Ht 5\' 11"  (1.803 m)   Wt 98.4 kg   SpO2 96%   BMI 30.27 kg/m   PROVIDERS: Nelwyn Salisbury, MD   LABS: Labs reviewed: Repeat BMP DOS (all labs ordered are listed, but only abnormal results are displayed)  Labs Reviewed  BASIC METABOLIC PANEL - Abnormal; Notable for the following components:      Result Value   Glucose, Bld 111 (*)    BUN 28 (*)    Creatinine, Ser 1.74 (*)    GFR, Estimated 39 (*)    All other components within normal limits  CBC - Abnormal; Notable for the following components:   Platelets 131 (*)    All other components within normal limits  SURGICAL PCR SCREEN     IMAGES:  CXR 11/07/22:  IMPRESSION: No acute cardiopulmonary disease.  EKG 06/03/22:  Sinus rhythm with first-degree AV block   CV: n/a    Past Medical History:   Diagnosis Date   Allergy    Anxiety    Arthritis    BPH (benign prostatic hyperplasia)    with elevated PSA, sees Dr. Retta Diones, benign   Cataract    Elevated PSA    sees Dr. Retta Diones    GERD (gastroesophageal reflux disease)    Hyperlipidemia    Hypertension    Insomnia    Platelets decreased (HCC)    Pneumonia    walking PNA   Pre-diabetes    No meds    Past Surgical History:  Procedure Laterality Date   CHOLECYSTECTOMY N/A 01/10/2014   Procedure: LAPAROSCOPIC CHOLECYSTECTOMY WITH INTRAOPERATIVE CHOLANGIOGRAM;  Surgeon: Valarie Merino, MD;  Location: [facility];  Service: General;  Laterality: N/A;   COLONOSCOPY  08/03/2018   per Dr. Leone Payor, adenomatous polyps, no repeats needed    JOINT REPLACEMENT     Right hip arthroplasty Dr. Charlann Boxer 01/05/17    left knee arthroscopy      LIPOSUCTION  1987   prostate biopsies in 2003 and again 05/2008     per Dr. Retta Diones. benign   REFRACTIVE SURGERY Bilateral    cataract surgery   repair  of large umblicial hernia     per Dr. Wenda Low on 01/10/10   right knee  arthroscopy     TONSILLECTOMY     TOTAL HIP ARTHROPLASTY Right 01/05/2017   Procedure: RIGHT TOTAL HIP ARTHROPLASTY ANTERIOR APPROACH;  Surgeon: Durene Romans, MD;  Location: [facility];  Service: Orthopedics;  Laterality: Right;  70 mins   TOTAL KNEE ARTHROPLASTY Left 07/16/2014   Procedure: LEFT TOTAL KNEE ARTHROPLASTY;  Surgeon: Durene Romans, MD;  Location[facility];  Service: Orthopedics;  Laterality: Left;    MEDICATIONS:  ALPRAZolam (XANAX) 1 MG tablet   atorvastatin (LIPITOR) 20 MG tablet   latanoprost (XALATAN) 0.005 % ophthalmic solution   lisinopril-hydrochlorothiazide (ZESTORETIC) 10-12.5 MG tablet   Multiple Vitamins-Minerals (CENTRUM SILVER 50+MEN) TABS   Multiple Vitamins-Minerals (PRESERVISION AREDS PO)   naproxen sodium (ALEVE) 220 MG tablet   omeprazole (PRILOSEC) 40 MG capsule   polyethylene glycol (MIRALAX / GLYCOLAX) packet   Syringe/Needle, Disp,  (SYRINGE 3CC/22GX1-1/2") 22G X 1-1/2" 3 ML MISC   tadalafil (CIALIS) 20 MG tablet   tamsulosin (FLOMAX) 0.4 MG CAPS capsule   temazepam (RESTORIL) 30 MG capsule   testosterone cypionate (DEPOTESTOSTERONE CYPIONATE) 200 MG/ML injection   No current facility-administered medications for this encounter.    Marcille Blanco [facility]/Anesthesiology [facility] Phone [phone] 06/10/2022 11:43 AM

## 2022-06-10 DIAGNOSIS — K080 Exfoliation of teeth due to systemic causes: Secondary | ICD-10-CM | POA: Diagnosis not present

## 2022-06-14 ENCOUNTER — Other Ambulatory Visit: Payer: Self-pay | Admitting: Family Medicine

## 2022-06-15 ENCOUNTER — Other Ambulatory Visit: Payer: Self-pay | Admitting: Family Medicine

## 2022-06-15 NOTE — H&P (Signed)
TOTAL KNEE ADMISSION H&P  Patient is being admitted for right total knee arthroplasty.  Subjective:  Chief Complaint:right knee pain.  HPI: Benjamin [name], 82 y.o. male, has a history of pain and functional disability in the right knee due to arthritis and has failed non-surgical conservative treatments for greater than 12 weeks to includeNSAID's and/or analgesics, corticosteriod injections, and activity modification.  Onset of symptoms was gradual, starting 2 years ago with gradually worsening course since that time. The patient noted no past surgery on the right knee(s).  Patient currently rates pain in the right knee(s) at 8 out of 10 with activity. Patient has worsening of pain with activity and weight bearing, pain that interferes with activities of daily living, and pain with passive range of motion.  Patient has evidence of joint space narrowing by imaging studies.  There is no active infection.  Patient Active Problem List   Diagnosis Date Noted   Patellofemoral pain syndrome of both knees 04/08/2020   Insomnia 12/30/2016   Depression with anxiety 12/04/2014   Right shoulder pain 12/04/2014   HTN (hypertension) 08/02/2014   Obese 07/18/2014   Diabetes mellitus type 2, controlled, without complications (HCC) 04/14/2011   Personal history of colonic polyps 02/26/2010   UMB HERNIA WITHOUT MENTION OBSTRUCTION/GANGRENE 08/16/2009   ED (erectile dysfunction) 06/24/2009   IRRITABLE BOWEL SYNDROME 09/03/2008   MALAISE AND FATIGUE 08/17/2008   Anxiety state 02/16/2007   BPH with urinary obstruction 02/16/2007   PROSTATE SPECIFIC ANTIGEN, ELEVATED 01/26/2007   Dyslipidemia 10/12/2006   Past Medical History:  Diagnosis Date   Allergy    Anxiety    Arthritis    BPH (benign prostatic hyperplasia)    with elevated PSA, sees Dr. Retta Diones, benign   Cataract    Elevated PSA    sees Dr. Retta Diones    GERD (gastroesophageal reflux disease)    Hyperlipidemia    Hypertension    Insomnia     Platelets decreased (HCC)    Pneumonia    walking PNA   Pre-diabetes    No meds    Past Surgical History:  Procedure Laterality Date   CHOLECYSTECTOMY N/A 01/10/2014   Procedure: LAPAROSCOPIC CHOLECYSTECTOMY WITH INTRAOPERATIVE CHOLANGIOGRAM;  Surgeon: Valarie Merino, MD;  [facility];  Service: General;  Laterality: N/A;   COLONOSCOPY  08/03/2018   per Dr. Leone Payor, adenomatous polyps, no repeats needed    JOINT REPLACEMENT     Right hip arthroplasty Dr. Charlann Boxer 01/05/17    left knee arthroscopy      LIPOSUCTION  1987   prostate biopsies in 2003 and again 05/2008     per Dr. Retta Diones. benign   REFRACTIVE SURGERY Bilateral    cataract surgery   repair  of large umblicial hernia     per Dr. Wenda Low on 01/10/10   right knee arthroscopy     TONSILLECTOMY     TOTAL HIP ARTHROPLASTY Right 01/05/2017   Procedure: RIGHT TOTAL HIP ARTHROPLASTY ANTERIOR APPROACH;  Surgeon: Durene Romans, MD;  [facility];  Service: Orthopedics;  Laterality: Right;  70 mins   TOTAL KNEE ARTHROPLASTY Left 07/16/2014   Procedure: LEFT TOTAL KNEE ARTHROPLASTY;  Surgeon: Durene Romans, MD;  [facility];  Service: Orthopedics;  Laterality: Left;    No current facility-administered medications for this encounter.   Current Outpatient Medications  Medication Sig Dispense Refill Last Dose   ALPRAZolam (XANAX) 1 MG tablet Take 1 tablet (1 mg total) by mouth every 6 (six) hours as needed for  anxiety. 120 tablet 5    atorvastatin (LIPITOR) 20 MG tablet Take 1 tablet (20 mg total) by mouth daily. 90 tablet 3    latanoprost (XALATAN) 0.005 % ophthalmic solution SMARTSIG:In Eye(s)      lisinopril-hydrochlorothiazide (ZESTORETIC) 10-12.5 MG tablet TAKE ONE TABLET BY MOUTH DAILY 90 tablet 0    Multiple Vitamins-Minerals (CENTRUM SILVER 50+MEN) TABS Take 1 tablet by mouth daily.      naproxen sodium (ALEVE) 220 MG tablet Take 220 mg by mouth daily as needed.      omeprazole (PRILOSEC) 40 MG capsule TAKE  ONE CAPSULE BY MOUTH TWICE DAILY 180 capsule 0    polyethylene glycol (MIRALAX / GLYCOLAX) packet Take 17 g by mouth 2 (two) times daily. (Patient taking differently: Take 17 g by mouth daily as needed.) 14 each 0    Syringe/Needle, Disp, (SYRINGE 3CC/22GX1-1/2") 22G X 1-1/2" 3 ML MISC 1 Application by Does not apply route every 7 (seven) days. 50 each 0    tadalafil (CIALIS) 20 MG tablet Take 1 tablet (20 mg total) by mouth as needed for erectile dysfunction. (Patient taking differently: Take 20 mg by mouth daily.) 30 tablet 11    tamsulosin (FLOMAX) 0.4 MG CAPS capsule Take 0.4 mg by mouth daily.      temazepam (RESTORIL) 30 MG capsule TAKE ONE CAPSULE BY MOUTH AT BEDTIME AS NEEDED FOR SLEEP 30 capsule 5    testosterone cypionate (DEPOTESTOSTERONE CYPIONATE) 200 MG/ML injection Inject 1 mL (200 mg total) into the muscle every 7 (seven) days. 10 mL 2    Multiple Vitamins-Minerals (PRESERVISION AREDS PO) Take by mouth.      Allergies  Allergen Reactions   Norco [Hydrocodone-Acetaminophen]     Hallucinations     Social History   Tobacco Use   Smoking status: Former    Packs/day: 1.5    Types: Cigarettes    Quit date: 02/09/1974    Years since quitting: 48.3   Smokeless tobacco: Never   Tobacco comments:    quit over 20 yo, absolutely smoked more than 100 cig; referred for [employer]   Substance Use Topics   Alcohol use: Yes    Alcohol/week: 14.0 standard drinks of alcohol    Types: 14 Shots of liquor per week    Comment: drinks a couple shots daily    Family History  Problem Relation Age of Onset   Alzheimer's disease Other    Colon cancer Neg Hx    Esophageal cancer Neg Hx    Pancreatic cancer Neg Hx    Prostate cancer Neg Hx    Rectal cancer Neg Hx    Stomach cancer Neg Hx      Review of Systems  Constitutional:  Negative for chills and fever.  Respiratory:  Negative for cough and shortness of breath.   Cardiovascular:  Negative for chest pain.  Gastrointestinal:  Negative for  nausea and vomiting.  Musculoskeletal:  Positive for arthralgias.     Objective:  Physical Exam Well nourished and well developed. General: Alert and oriented x3, cooperative and pleasant, no acute distress. Head: normocephalic, atraumatic, neck supple. Eyes: EOMI.  Musculoskeletal: Right Knee Exam: No erythema, warmth. Mild tenderness to palpation about the lateral joint line. No effusion. AROM 0-120.  Calves soft and nontender. Motor function intact in LE. Strength 5/5 LE bilaterally. Neuro: Distal pulses 2+. Sensation to light touch intact in LE.  Vital signs in last 24 hours:    Labs:   Estimated body mass index is 30.27 kg/m  as calculated from the following:   Height as of 06/03/22: 5\' 11"  (1.803 m).   Weight as of 06/03/22: 98.4 kg.   Imaging Review Plain radiographs demonstrate severe degenerative joint disease of the right knee(s). The overall alignment isneutral. The bone quality appears to be adequate for age and reported activity level.      Assessment/Plan:  End stage arthritis, right knee   The patient history, physical examination, clinical judgment of the provider and imaging studies are consistent with end stage degenerative joint disease of the right knee(s) and total knee arthroplasty is deemed medically necessary. The treatment options including medical management, injection therapy arthroscopy and arthroplasty were discussed at length. The risks and benefits of total knee arthroplasty were presented and reviewed. The risks due to aseptic loosening, infection, stiffness, patella tracking problems, thromboembolic complications and other imponderables were discussed. The patient acknowledged the explanation, agreed to proceed with the plan and consent was signed. Patient is being admitted for inpatient treatment for surgery, pain control, PT, OT, prophylactic antibiotics, VTE prophylaxis, progressive ambulation and ADL's and discharge planning. The patient is  planning to be discharged  home.   Therapy Plans: outpatient therapy at [facility] Disposition: Home with girlfriend (staying at her house) Planned DVT Prophylaxis: aspirin 81mg  BID DME needed: none PCP: Dr. Gershon Crane, clearance received TXA: IV Allergies: NKDA Anesthesia Concerns: had general with last TKA due to low platelets BMI: 30.4 Last HgbA1c: Not diabetic   Other: - hx of left TKA 8-9 years ago - did well, went to [place] then - OPPT this time - ice machine at home - if not working we will get new one - loopy/hallucinations with norco last time, minimal pain afterwards - will do low dose oxy (2.5-5), tylenol, celebrex, methocarbamol - No hx of stomach ulcer, CKD, VTE, or cancer  Patient's anticipated LOS is less than 2 midnights, meeting these requirements: - Younger than 99 - Lives within 1 hour of care - Has a competent adult at home to recover with post-op recover - NO history of  - Chronic pain requiring opiods  - Diabetes  - Coronary Artery Disease  - Heart failure  - Heart attack  - Stroke  - DVT/VTE  - Cardiac arrhythmia  - Respiratory Failure/COPD  - Renal failure  - Anemia  - Advanced Liver disease  Rosalene Billings, PA-C Orthopedic Surgery [facility] [place] [phone]

## 2022-06-15 NOTE — Anesthesia Preprocedure Evaluation (Signed)
Anesthesia Evaluation  Patient identified by MRN, date of birth, ID band Patient awake    Reviewed: Allergy & Precautions, NPO status , Patient's Chart, lab work & pertinent test results  History of Anesthesia Complications Negative for: history of anesthetic complications  Airway Mallampati: III  TM Distance: >3 FB Neck ROM: Full   Comment: Previous grade II view with MAC 4, easy mask Dental  (+) Dental Advisory Given,    Pulmonary neg shortness of breath, neg sleep apnea, neg COPD, neg recent URI, former smoker   Pulmonary exam normal breath sounds clear to auscultation       Cardiovascular hypertension (lisinpril-HCTZ), Pt. 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)  Rhythm:Regular Rate:Normal  HLD   Neuro/Psych neg Seizures PSYCHIATRIC DISORDERS Anxiety Depression       GI/Hepatic Neg liver ROS,GERD  Medicated,,  Endo/Other  diabetes (patient denies)    Renal/GU Renal disease     Musculoskeletal  (+) Arthritis ,    Abdominal   Peds  Hematology negative hematology ROS (+)   Anesthesia Other Findings Platelets 131  Reproductive/Obstetrics                             Anesthesia Physical Anesthesia Plan  ASA: 3  Anesthesia Plan: MAC, Regional and Spinal   Post-op Pain Management: Regional block* and Tylenol PO (pre-op)*   Induction: Intravenous  PONV Risk Score and Plan: 1 and Ondansetron, Dexamethasone, Propofol infusion and Treatment may vary due to age or medical condition  Airway Management Planned: Natural Airway and Simple Face Mask  Additional Equipment:   Intra-op Plan:   Post-operative Plan: Extubation in OR  Informed Consent: I have reviewed the patients History and Physical, chart, labs and discussed the procedure including the risks, benefits and alternatives for the proposed anesthesia with the  patient or authorized representative who has indicated his/her understanding and acceptance.     Dental advisory given  Plan Discussed with: CRNA and Anesthesiologist  Anesthesia Plan Comments: (Discussed potential risks of nerve blocks including, but not limited to, infection, bleeding, nerve damage, seizures, pneumothorax, respiratory depression, and potential failure of the block. Alternatives to nerve blocks discussed. All questions answered.  I have discussed risks of neuraxial anesthesia including but not limited to infection, bleeding, nerve injury, back pain, headache, seizures, and failure of block. Patient denies bleeding disorders and is not currently anticoagulated. Labs have been reviewed. Risks and benefits discussed. All patient's questions answered.   Discussed with patient risks of MAC including, but not limited to, minor pain or discomfort, hearing people in the room, and possible need for backup general anesthesia. Risks for general anesthesia also discussed including, but not limited to, sore throat, hoarse voice, chipped/damaged teeth, injury to vocal cords, nausea and vomiting, allergic reactions, lung infection, heart attack, stroke, and death. All questions answered. )        Anesthesia Quick Evaluation

## 2022-06-16 ENCOUNTER — Other Ambulatory Visit: Payer: Self-pay

## 2022-06-16 ENCOUNTER — Ambulatory Visit (HOSPITAL_BASED_OUTPATIENT_CLINIC_OR_DEPARTMENT_OTHER): Payer: Medicare Other | Admitting: Medical

## 2022-06-16 ENCOUNTER — Encounter (HOSPITAL_COMMUNITY): Payer: Self-pay | Admitting: Orthopedic Surgery

## 2022-06-16 ENCOUNTER — Encounter (HOSPITAL_COMMUNITY)
Admission: RE | Disposition: A | Discharge status: Home / Self Care | Payer: Self-pay | Source: Ambulatory Visit | Attending: Orthopedic Surgery

## 2022-06-16 ENCOUNTER — Ambulatory Visit (HOSPITAL_COMMUNITY): Payer: Medicare Other | Admitting: Physician Assistant

## 2022-06-16 ENCOUNTER — Observation Stay (HOSPITAL_COMMUNITY)
Admission: RE | Admit: 2022-06-16 | Discharge: 2022-06-17 | Disposition: A | Discharge status: Home / Self Care | Payer: Medicare Other | Source: Ambulatory Visit | Attending: Orthopedic Surgery | Admitting: Orthopedic Surgery

## 2022-06-16 DIAGNOSIS — Z96641 Presence of right artificial hip joint: Secondary | ICD-10-CM | POA: Diagnosis not present

## 2022-06-16 DIAGNOSIS — Z87891 Personal history of nicotine dependence: Secondary | ICD-10-CM

## 2022-06-16 DIAGNOSIS — N179 Acute kidney failure, unspecified: Secondary | ICD-10-CM

## 2022-06-16 DIAGNOSIS — M1711 Unilateral primary osteoarthritis, right knee: Secondary | ICD-10-CM | POA: Diagnosis not present

## 2022-06-16 DIAGNOSIS — E119 Type 2 diabetes mellitus without complications: Secondary | ICD-10-CM | POA: Insufficient documentation

## 2022-06-16 DIAGNOSIS — Z96652 Presence of left artificial knee joint: Secondary | ICD-10-CM | POA: Insufficient documentation

## 2022-06-16 DIAGNOSIS — F418 Other specified anxiety disorders: Secondary | ICD-10-CM | POA: Diagnosis not present

## 2022-06-16 DIAGNOSIS — I10 Essential (primary) hypertension: Secondary | ICD-10-CM

## 2022-06-16 DIAGNOSIS — M1712 Unilateral primary osteoarthritis, left knee: Secondary | ICD-10-CM | POA: Diagnosis not present

## 2022-06-16 DIAGNOSIS — Z79899 Other long term (current) drug therapy: Secondary | ICD-10-CM | POA: Diagnosis not present

## 2022-06-16 DIAGNOSIS — Z96651 Presence of right artificial knee joint: Secondary | ICD-10-CM

## 2022-06-16 DIAGNOSIS — D696 Thrombocytopenia, unspecified: Secondary | ICD-10-CM

## 2022-06-16 DIAGNOSIS — G8918 Other acute postprocedural pain: Secondary | ICD-10-CM | POA: Diagnosis not present

## 2022-06-16 DIAGNOSIS — Z01818 Encounter for other preprocedural examination: Secondary | ICD-10-CM

## 2022-06-16 HISTORY — PX: TOTAL KNEE ARTHROPLASTY: SHX125

## 2022-06-16 LAB — BASIC METABOLIC PANEL
Anion gap: 9 (ref 5–15)
BUN: 19 mg/dL (ref 8–23)
CO2: 24 mmol/L (ref 22–32)
Calcium: 8.7 mg/dL — ABNORMAL LOW (ref 8.9–10.3)
Chloride: 101 mmol/L (ref 98–111)
Creatinine, Ser: 1.1 mg/dL (ref 0.61–1.24)
GFR, Estimated: >60 mL/min (ref >60)
Glucose, Bld: 144 mg/dL — ABNORMAL HIGH (ref 70–99)
Potassium: 3.7 mmol/L (ref 3.5–5.1)
Sodium: 134 mmol/L — ABNORMAL LOW (ref 135–145)

## 2022-06-16 LAB — CBC
HCT: 45.3 % (ref 39.0–52.0)
Hemoglobin: 14.9 g/dL (ref 13.0–17.0)
MCH: 31.2 pg (ref 26.0–34.0)
MCHC: 32.9 g/dL (ref 30.0–36.0)
MCV: 94.8 fL (ref 80.0–100.0)
Platelets: 97 10*3/uL — ABNORMAL LOW (ref 150–400)
RBC: 4.78 MIL/uL (ref 4.22–5.81)
RDW: 14 % (ref 11.5–15.5)
WBC: 5.7 10*3/uL (ref 4.0–10.5)
nRBC: 0 % (ref 0.0–0.2)

## 2022-06-16 SURGERY — ARTHROPLASTY, KNEE, TOTAL
Anesthesia: Monitor Anesthesia Care | Site: Knee | Laterality: Right

## 2022-06-16 MED ORDER — BISACODYL 10 MG RE SUPP: 10.0000 mg | Freq: Every day | RECTAL | Status: DC | PRN

## 2022-06-16 MED ORDER — ATORVASTATIN CALCIUM 20 MG PO TABS
20.0000 mg | ORAL_TABLET | Freq: Every day | ORAL | Status: DC
  Administered 2022-06-17: 20 mg via ORAL
  Filled 2022-06-16: qty 1

## 2022-06-16 MED ORDER — BUPIVACAINE-EPINEPHRINE (PF) 0.25% -1:200000 IJ SOLN
INTRAMUSCULAR | Status: DC | PRN
  Administered 2022-06-16: 30 mL

## 2022-06-16 MED ORDER — HYDROMORPHONE HCL 1 MG/ML IJ SOLN
0.2500 mg | INTRAMUSCULAR | Status: DC | PRN
  Administered 2022-06-16: 0.5 mg via INTRAVENOUS

## 2022-06-16 MED ORDER — TAMSULOSIN HCL 0.4 MG PO CAPS: 0.4000 mg | ORAL_CAPSULE | Freq: Every day | ORAL | Status: DC

## 2022-06-16 MED ORDER — POLYETHYLENE GLYCOL 3350 17 G PO PACK
17.0000 g | PACK | Freq: Two times a day (BID) | ORAL | Status: DC
  Filled 2022-06-16: qty 1

## 2022-06-16 MED ORDER — SODIUM CHLORIDE (PF) 0.9 % IJ SOLN
INTRAMUSCULAR | Status: DC | PRN
  Administered 2022-06-16: 30 mL

## 2022-06-16 MED ORDER — 0.9 % SODIUM CHLORIDE (POUR BTL) OPTIME
TOPICAL | Status: DC | PRN
  Administered 2022-06-16: 1000 mL

## 2022-06-16 MED ORDER — LISINOPRIL-HYDROCHLOROTHIAZIDE 10-12.5 MG PO TABS: 1.0000 | ORAL_TABLET | Freq: Every day | ORAL | Status: DC

## 2022-06-16 MED ORDER — ONDANSETRON HCL 4 MG/2ML IJ SOLN
INTRAMUSCULAR | Status: DC | PRN
  Administered 2022-06-16: 4 mg via INTRAVENOUS

## 2022-06-16 MED ORDER — LACTATED RINGERS IV SOLN: INTRAVENOUS | Status: DC

## 2022-06-16 MED ORDER — HYDROCHLOROTHIAZIDE 12.5 MG PO TABS
12.5000 mg | ORAL_TABLET | Freq: Every day | ORAL | Status: DC
  Administered 2022-06-17: 12.5 mg via ORAL
  Filled 2022-06-16: qty 1

## 2022-06-16 MED ORDER — PHENOL 1.4 % MT LIQD: 1.0000 | OROMUCOSAL | Status: DC | PRN

## 2022-06-16 MED ORDER — KETOROLAC TROMETHAMINE 30 MG/ML IJ SOLN
INTRAMUSCULAR | Status: AC
  Filled 2022-06-16: qty 1

## 2022-06-16 MED ORDER — HYDROMORPHONE HCL 1 MG/ML IJ SOLN
INTRAMUSCULAR | Status: AC
  Administered 2022-06-16: 0.5 mg via INTRAVENOUS
  Filled 2022-06-16: qty 1

## 2022-06-16 MED ORDER — FENTANYL CITRATE PF 50 MCG/ML IJ SOSY
PREFILLED_SYRINGE | INTRAMUSCULAR | Status: AC
  Filled 2022-06-16: qty 2

## 2022-06-16 MED ORDER — PROPOFOL 10 MG/ML IV BOLUS
INTRAVENOUS | Status: DC | PRN
  Administered 2022-06-16: 50 mg via INTRAVENOUS

## 2022-06-16 MED ORDER — OXYCODONE HCL 5 MG PO TABS: 2.5000 mg | ORAL_TABLET | ORAL | Status: DC | PRN

## 2022-06-16 MED ORDER — CEFAZOLIN SODIUM-DEXTROSE 2-4 GM/100ML-% IV SOLN
2.0000 g | Freq: Four times a day (QID) | INTRAVENOUS | Status: AC
  Administered 2022-06-16 (×2): 2 g via INTRAVENOUS
  Filled 2022-06-16 (×2): qty 100

## 2022-06-16 MED ORDER — TEMAZEPAM 15 MG PO CAPS: 30.0000 mg | ORAL_CAPSULE | Freq: Every evening | ORAL | Status: DC | PRN

## 2022-06-16 MED ORDER — ACETAMINOPHEN 500 MG PO TABS
1000.0000 mg | ORAL_TABLET | Freq: Four times a day (QID) | ORAL | Status: DC
  Administered 2022-06-16 – 2022-06-17 (×4): 1000 mg via ORAL
  Filled 2022-06-16 (×4): qty 2

## 2022-06-16 MED ORDER — KETOROLAC TROMETHAMINE 30 MG/ML IJ SOLN
INTRAMUSCULAR | Status: DC | PRN
  Administered 2022-06-16: 30 mg via INTRAMUSCULAR

## 2022-06-16 MED ORDER — BUPIVACAINE IN DEXTROSE 0.75-8.25 % IT SOLN
INTRATHECAL | Status: DC | PRN
  Administered 2022-06-16: 1.8 mL via INTRATHECAL

## 2022-06-16 MED ORDER — FENTANYL CITRATE (PF) 100 MCG/2ML IJ SOLN
INTRAMUSCULAR | Status: DC | PRN
  Administered 2022-06-16: 100 ug via INTRAVENOUS
  Administered 2022-06-16: 50 ug via INTRAVENOUS

## 2022-06-16 MED ORDER — AMISULPRIDE (ANTIEMETIC) 5 MG/2ML IV SOLN: 10.0000 mg | Freq: Once | INTRAVENOUS | Status: DC | PRN

## 2022-06-16 MED ORDER — ONDANSETRON HCL 4 MG/2ML IJ SOLN: 4.0000 mg | Freq: Four times a day (QID) | INTRAMUSCULAR | Status: DC | PRN

## 2022-06-16 MED ORDER — BUPIVACAINE HCL (PF) 0.25 % IJ SOLN
INTRAMUSCULAR | Status: AC
  Filled 2022-06-16: qty 30

## 2022-06-16 MED ORDER — METOCLOPRAMIDE HCL 5 MG PO TABS: 5.0000 mg | ORAL_TABLET | Freq: Three times a day (TID) | ORAL | Status: DC | PRN

## 2022-06-16 MED ORDER — EPINEPHRINE PF 1 MG/ML IJ SOLN
INTRAMUSCULAR | Status: AC
  Filled 2022-06-16: qty 1

## 2022-06-16 MED ORDER — PROPOFOL 1000 MG/100ML IV EMUL
INTRAVENOUS | Status: AC
  Filled 2022-06-16: qty 100

## 2022-06-16 MED ORDER — CELECOXIB 200 MG PO CAPS: 200.0000 mg | ORAL_CAPSULE | Freq: Every day | ORAL | Status: DC

## 2022-06-16 MED ORDER — PHENYLEPHRINE HCL-NACL 20-0.9 MG/250ML-% IV SOLN
INTRAVENOUS | Status: AC
  Filled 2022-06-16: qty 250

## 2022-06-16 MED ORDER — FENTANYL CITRATE (PF) 100 MCG/2ML IJ SOLN
INTRAMUSCULAR | Status: AC
  Filled 2022-06-16: qty 2

## 2022-06-16 MED ORDER — METOCLOPRAMIDE HCL 5 MG/ML IJ SOLN: 5.0000 mg | Freq: Three times a day (TID) | INTRAMUSCULAR | Status: DC | PRN

## 2022-06-16 MED ORDER — POVIDONE-IODINE 10 % EX SWAB: 2.0000 | Freq: Once | CUTANEOUS | Status: DC

## 2022-06-16 MED ORDER — SODIUM CHLORIDE 0.9 % IR SOLN
Status: DC | PRN
  Administered 2022-06-16: 1000 mL

## 2022-06-16 MED ORDER — HYDROMORPHONE HCL 1 MG/ML IJ SOLN
0.5000 mg | INTRAMUSCULAR | Status: DC | PRN
  Administered 2022-06-16: 1 mg via INTRAVENOUS
  Filled 2022-06-16: qty 1

## 2022-06-16 MED ORDER — DEXAMETHASONE SODIUM PHOSPHATE 10 MG/ML IJ SOLN
8.0000 mg | Freq: Once | INTRAMUSCULAR | Status: AC
  Administered 2022-06-16: 8 mg via INTRAVENOUS

## 2022-06-16 MED ORDER — TAMSULOSIN HCL 0.4 MG PO CAPS
0.4000 mg | ORAL_CAPSULE | Freq: Every day | ORAL | Status: DC
  Administered 2022-06-16: 0.4 mg via ORAL
  Filled 2022-06-16: qty 1

## 2022-06-16 MED ORDER — LISINOPRIL 10 MG PO TABS
10.0000 mg | ORAL_TABLET | Freq: Every day | ORAL | Status: DC
  Administered 2022-06-17: 10 mg via ORAL
  Filled 2022-06-16: qty 1

## 2022-06-16 MED ORDER — OXYCODONE HCL 5 MG/5ML PO SOLN: 5.0000 mg | Freq: Once | ORAL | Status: DC | PRN

## 2022-06-16 MED ORDER — DEXAMETHASONE SODIUM PHOSPHATE 10 MG/ML IJ SOLN
10.0000 mg | Freq: Once | INTRAMUSCULAR | Status: DC
  Filled 2022-06-16: qty 1

## 2022-06-16 MED ORDER — ALPRAZOLAM 0.5 MG PO TABS
1.0000 mg | ORAL_TABLET | Freq: Two times a day (BID) | ORAL | Status: DC
  Administered 2022-06-16: 1 mg via ORAL
  Filled 2022-06-16: qty 2

## 2022-06-16 MED ORDER — LATANOPROST 0.005 % OP SOLN
1.0000 [drp] | Freq: Every day | OPHTHALMIC | Status: DC
  Administered 2022-06-16: 1 [drp] via OPHTHALMIC
  Filled 2022-06-16: qty 2.5

## 2022-06-16 MED ORDER — FENTANYL CITRATE PF 50 MCG/ML IJ SOSY
25.0000 ug | PREFILLED_SYRINGE | INTRAMUSCULAR | Status: DC | PRN
  Administered 2022-06-16 (×3): 50 ug via INTRAVENOUS

## 2022-06-16 MED ORDER — CHLORHEXIDINE GLUCONATE 0.12 % MT SOLN
15.0000 mL | Freq: Once | OROMUCOSAL | Status: AC
  Administered 2022-06-16: 15 mL via OROMUCOSAL

## 2022-06-16 MED ORDER — DIPHENHYDRAMINE HCL 12.5 MG/5ML PO ELIX: 12.5000 mg | ORAL_SOLUTION | ORAL | Status: DC | PRN

## 2022-06-16 MED ORDER — DOCUSATE SODIUM 100 MG PO CAPS
100.0000 mg | ORAL_CAPSULE | Freq: Two times a day (BID) | ORAL | Status: DC
  Administered 2022-06-16 – 2022-06-17 (×2): 100 mg via ORAL
  Filled 2022-06-16 (×2): qty 1

## 2022-06-16 MED ORDER — PANTOPRAZOLE SODIUM 40 MG PO TBEC
80.0000 mg | DELAYED_RELEASE_TABLET | Freq: Every day | ORAL | Status: DC
  Administered 2022-06-17: 80 mg via ORAL
  Filled 2022-06-16: qty 2

## 2022-06-16 MED ORDER — FENTANYL CITRATE PF 50 MCG/ML IJ SOSY
PREFILLED_SYRINGE | INTRAMUSCULAR | Status: AC
  Filled 2022-06-16: qty 1

## 2022-06-16 MED ORDER — METHOCARBAMOL 500 MG PO TABS
500.0000 mg | ORAL_TABLET | Freq: Four times a day (QID) | ORAL | Status: DC | PRN
  Administered 2022-06-17: 500 mg via ORAL
  Filled 2022-06-16 (×2): qty 1

## 2022-06-16 MED ORDER — TRANEXAMIC ACID-NACL 1000-0.7 MG/100ML-% IV SOLN
1000.0000 mg | INTRAVENOUS | Status: AC
  Administered 2022-06-16: 1000 mg via INTRAVENOUS
  Filled 2022-06-16: qty 100

## 2022-06-16 MED ORDER — MENTHOL 3 MG MT LOZG: 1.0000 | LOZENGE | OROMUCOSAL | Status: DC | PRN

## 2022-06-16 MED ORDER — SODIUM CHLORIDE 0.9 % IV SOLN: INTRAVENOUS | Status: DC

## 2022-06-16 MED ORDER — PROPOFOL 500 MG/50ML IV EMUL
INTRAVENOUS | Status: DC | PRN
  Administered 2022-06-16: 100 ug/kg/min via INTRAVENOUS

## 2022-06-16 MED ORDER — OXYCODONE HCL 5 MG PO TABS: 5.0000 mg | ORAL_TABLET | Freq: Once | ORAL | Status: DC | PRN

## 2022-06-16 MED ORDER — ONDANSETRON HCL 4 MG PO TABS: 4.0000 mg | ORAL_TABLET | Freq: Four times a day (QID) | ORAL | Status: DC | PRN

## 2022-06-16 MED ORDER — ACETAMINOPHEN 500 MG PO TABS
1000.0000 mg | ORAL_TABLET | Freq: Once | ORAL | Status: AC
  Administered 2022-06-16: 1000 mg via ORAL
  Filled 2022-06-16: qty 2

## 2022-06-16 MED ORDER — SODIUM CHLORIDE (PF) 0.9 % IJ SOLN
INTRAMUSCULAR | Status: AC
  Filled 2022-06-16: qty 30

## 2022-06-16 MED ORDER — DEXAMETHASONE SODIUM PHOSPHATE 10 MG/ML IJ SOLN
INTRAMUSCULAR | Status: AC
  Filled 2022-06-16: qty 1

## 2022-06-16 MED ORDER — METHOCARBAMOL 500 MG IVPB - SIMPLE MED
500.0000 mg | Freq: Four times a day (QID) | INTRAVENOUS | Status: DC | PRN
  Administered 2022-06-16: 500 mg via INTRAVENOUS

## 2022-06-16 MED ORDER — ONDANSETRON HCL 4 MG/2ML IJ SOLN
INTRAMUSCULAR | Status: AC
  Filled 2022-06-16: qty 2

## 2022-06-16 MED ORDER — ASPIRIN 81 MG PO CHEW
81.0000 mg | CHEWABLE_TABLET | Freq: Two times a day (BID) | ORAL | Status: DC
  Administered 2022-06-16 – 2022-06-17 (×2): 81 mg via ORAL
  Filled 2022-06-16 (×2): qty 1

## 2022-06-16 MED ORDER — ORAL CARE MOUTH RINSE: 15.0000 mL | Freq: Once | OROMUCOSAL | Status: AC

## 2022-06-16 MED ORDER — OXYCODONE HCL 5 MG PO TABS
5.0000 mg | ORAL_TABLET | ORAL | Status: DC | PRN
  Filled 2022-06-16: qty 1

## 2022-06-16 MED ORDER — TRANEXAMIC ACID-NACL 1000-0.7 MG/100ML-% IV SOLN
1000.0000 mg | Freq: Once | INTRAVENOUS | Status: AC
  Administered 2022-06-16: 1000 mg via INTRAVENOUS
  Filled 2022-06-16: qty 100

## 2022-06-16 MED ORDER — METHOCARBAMOL 500 MG IVPB - SIMPLE MED
INTRAVENOUS | Status: AC
  Filled 2022-06-16: qty 55

## 2022-06-16 MED ORDER — CEFAZOLIN SODIUM-DEXTROSE 2-4 GM/100ML-% IV SOLN
2.0000 g | INTRAVENOUS | Status: AC
  Administered 2022-06-16: 2 g via INTRAVENOUS
  Filled 2022-06-16: qty 100

## 2022-06-16 SURGICAL SUPPLY — 58 items
ADH SKN CLS APL DERMABOND .7 (GAUZE/BANDAGES/DRESSINGS) ×1
ATTUNE MED ANAT PAT 41 KNEE (Knees) IMPLANT
BAG COUNTER SPONGE SURGICOUNT (BAG) IMPLANT
BAG SPEC THK2 15X12 ZIP CLS (MISCELLANEOUS)
BAG SPNG CNTER NS LX DISP (BAG)
BAG ZIPLOCK 12X15 (MISCELLANEOUS) IMPLANT
BASEPLATE TIB CMT FB PCKT SZ6 (Knees) IMPLANT
BLADE SAW SGTL 11.0X1.19X90.0M (BLADE) IMPLANT
BLADE SAW SGTL 13.0X1.19X90.0M (BLADE) ×1 IMPLANT
BNDG CMPR 5X62 HK CLSR LF (GAUZE/BANDAGES/DRESSINGS) ×1
BNDG CMPR MED 10X6 ELC LF (GAUZE/BANDAGES/DRESSINGS) ×1
BNDG ELASTIC 6INX 5YD STR LF (GAUZE/BANDAGES/DRESSINGS) ×1 IMPLANT
BNDG ELASTIC 6X10 VLCR STRL LF (GAUZE/BANDAGES/DRESSINGS) IMPLANT
BOWL SMART MIX CTS (DISPOSABLE) ×1 IMPLANT
BSPLAT TIB 6 CMNT FXBRNG STRL (Knees) ×1 IMPLANT
CEMENT HV SMART SET (Cement) IMPLANT
COMP FEM CMT ATTUNE KNEE 7 RT (Joint) ×1 IMPLANT
COMPONENT FEM CMT ATTN KN 7 RT (Joint) IMPLANT
COVER SURGICAL LIGHT HANDLE (MISCELLANEOUS) ×1 IMPLANT
CUFF TOURN SGL QUICK 34 (TOURNIQUET CUFF) ×1
CUFF TRNQT CYL 34X4.125X (TOURNIQUET CUFF) ×1 IMPLANT
DERMABOND ADVANCED .7 DNX12 (GAUZE/BANDAGES/DRESSINGS) ×1 IMPLANT
DRAPE U-SHAPE 47X51 STRL (DRAPES) ×1 IMPLANT
DRESSING AQUACEL AG SP 3.5X10 (GAUZE/BANDAGES/DRESSINGS) ×1 IMPLANT
DRSG AQUACEL AG ADV 3.5X10 (GAUZE/BANDAGES/DRESSINGS) IMPLANT
DRSG AQUACEL AG SP 3.5X10 (GAUZE/BANDAGES/DRESSINGS) ×1
DURAPREP 26ML APPLICATOR (WOUND CARE) ×2 IMPLANT
ELECT REM PT RETURN 15FT ADLT (MISCELLANEOUS) ×1 IMPLANT
GLOVE BIO SURGEON STRL SZ 6 (GLOVE) ×1 IMPLANT
GLOVE BIOGEL PI IND STRL 6.5 (GLOVE) ×1 IMPLANT
GLOVE BIOGEL PI IND STRL 7.5 (GLOVE) ×1 IMPLANT
GLOVE ORTHO TXT STRL SZ7.5 (GLOVE) ×2 IMPLANT
GOWN STRL REUS W/ TWL LRG LVL3 (GOWN DISPOSABLE) ×2 IMPLANT
GOWN STRL REUS W/TWL LRG LVL3 (GOWN DISPOSABLE) ×2
HANDPIECE INTERPULSE COAX TIP (DISPOSABLE) ×1
HOLDER FOLEY CATH W/STRAP (MISCELLANEOUS) IMPLANT
INSERT TIB ATTUNE KNEE 7 8 RT (Insert) IMPLANT
KIT TURNOVER KIT A (KITS) IMPLANT
MANIFOLD NEPTUNE II (INSTRUMENTS) ×1 IMPLANT
NDL SAFETY ECLIP 18X1.5 (MISCELLANEOUS) IMPLANT
NS IRRIG 1000ML POUR BTL (IV SOLUTION) ×1 IMPLANT
PACK TOTAL KNEE CUSTOM (KITS) ×1 IMPLANT
PIN FIX SIGMA LCS THRD HI (PIN) IMPLANT
PROTECTOR NERVE ULNAR (MISCELLANEOUS) ×1 IMPLANT
SET HNDPC FAN SPRY TIP SCT (DISPOSABLE) ×1 IMPLANT
SET PAD KNEE POSITIONER (MISCELLANEOUS) ×1 IMPLANT
SPIKE FLUID TRANSFER (MISCELLANEOUS) ×2 IMPLANT
SUT MNCRL AB 4-0 PS2 18 (SUTURE) ×1 IMPLANT
SUT STRATAFIX PDS+ 0 24IN (SUTURE) ×1 IMPLANT
SUT VIC AB 1 CT1 36 (SUTURE) ×1 IMPLANT
SUT VIC AB 2-0 CT1 27 (SUTURE) ×2
SUT VIC AB 2-0 CT1 TAPERPNT 27 (SUTURE) ×2 IMPLANT
SYR 3ML LL SCALE MARK (SYRINGE) ×1 IMPLANT
TOWEL GREEN STERILE FF (TOWEL DISPOSABLE) ×1 IMPLANT
TRAY FOLEY MTR SLVR 16FR STAT (SET/KITS/TRAYS/PACK) ×1 IMPLANT
TUBE SUCTION HIGH CAP CLEAR NV (SUCTIONS) ×1 IMPLANT
WATER STERILE IRR 1000ML POUR (IV SOLUTION) ×2 IMPLANT
WRAP KNEE MAXI GEL POST OP (GAUZE/BANDAGES/DRESSINGS) ×1 IMPLANT

## 2022-06-16 NOTE — Op Note (Signed)
NAME:  Benjamin [name]                      MEDICAL RECORD NO.:  [id number]                             FACILITY:  [facility]      PHYSICIAN:  Madlyn Frankel. Charlann Boxer, M.D.  DATE OF BIRTH:  1940-06-25      DATE OF PROCEDURE:  06/16/2022                                     OPERATIVE REPORT         PREOPERATIVE DIAGNOSIS:  Right knee osteoarthritis.      POSTOPERATIVE DIAGNOSIS:  Right knee osteoarthritis.      FINDINGS:  The patient was noted to have complete loss of cartilage and   bone-on-bone arthritis with associated osteophytes in the medial and patellofemoral compartments of   the knee with a significant synovitis and associated effusion.  The patient had failed months of conservative treatment including medications, injection therapy, activity modification.     PROCEDURE:  Right total knee replacement.      COMPONENTS USED:  DePuy Attune FB CR MS knee   system, a size 7 femur, 6 tibia, size 8 mm FB CR MS AOX insert, and 41 anatomic patellar   button.      SURGEON:  Madlyn Frankel. Charlann Boxer, M.D.      ASSISTANT:  Rosalene Billings, PA-C.      ANESTHESIA:  General, Regional, and Spinal.      SPECIMENS:  None.      COMPLICATION:  None.      DRAINS:  None.  EBL: <100 cc      TOURNIQUET TIME:   Total Tourniquet Time Documented: Thigh (Right) - 34 minutes Total: Thigh (Right) - 34 minutes  .      The patient was stable to the recovery room.      INDICATION FOR PROCEDURE:  Benjamin [name] is a 82 y.o. male patient of   mine.  The patient had been seen, evaluated, and treated for months conservatively in the   office with medication, activity modification, and injections.  The patient had   radiographic changes of bone-on-bone arthritis with endplate sclerosis and osteophytes noted.  Based on the radiographic changes and failed conservative measures, the patient   decided to proceed with definitive treatment, total knee replacement.  Risks of infection, DVT, component failure, need for revision  surgery, neurovascular injury were reviewed in the office setting.  The postop course was reviewed stressing the efforts to maximize post-operative satisfaction and function.  Consent was obtained for benefit of pain   relief.      PROCEDURE IN DETAIL:  The patient was brought to the operative theater.   Once adequate anesthesia, preoperative antibiotics, 2 gm of Ancef,1 gm of Tranexamic Acid, and 10 mg of Decadron administered, the patient was positioned supine with a right thigh tourniquet placed.  The  right lower extremity was prepped and draped in sterile fashion.  A time-   out was performed identifying the patient, planned procedure, and the appropriate extremity.      The right lower extremity was placed in the [facility] leg holder.  The leg was   exsanguinated, tourniquet elevated to 250 mmHg.  A midline incision was  made followed by median parapatellar arthrotomy.  Following initial   exposure, attention was first directed to the patella.  Precut   measurement was noted to be 26 mm.  I resected down to 14 mm and used a   41 anatomic patellar button to restore patellar height as well as cover the cut surface.      The lug holes were drilled and a metal shim was placed to protect the   patella from retractors and saw blade during the procedure.      At this point, attention was now directed to the femur.  The femoral   canal was opened with a drill, irrigated to try to prevent fat emboli.  An   intramedullary rod was passed at 5 degrees valgus, 9 mm of bone was   resected off the distal femur.  Following this resection, the tibia was   subluxated anteriorly.  Using the extramedullary guide, 2 mm of bone was resected off   the proximal medial tibia.  We confirmed the gap would be   stable medially and laterally with a size 6 spacer block as well as confirmed that the tibial cut was perpendicular in the coronal plane, checking with an alignment rod.      Once this was done, I sized the  femur to be a size 7 in the anterior-   posterior dimension, chose a standard component based on medial and   lateral dimension.  The size 7 rotation block was then pinned in   position anterior referenced using the C-clamp to set rotation.  The   anterior, posterior, and  chamfer cuts were made without difficulty nor   notching making certain that I was along the anterior cortex to help   with flexion gap stability.      The final shim cut was made off the lateral aspect of distal femur.      At this point, the tibia was sized to be a size 6.  The size 6 tray was   then pinned in position based on floating a trial tibial tray in extension then drilled, and keel punched.  Trial reduction was now carried with a 7 femur,  6 tibia, a size 8 mm CR MS insert, and the 41 anatomic patella botton.  The knee was brought to full extension with good flexion stability with the patella   tracking through the trochlea without application of pressure.  Given   all these findings the trial components removed.  Final components were   opened and cement was mixed.  The knee was irrigated with normal saline solution and pulse lavage.  The synovial lining was   then injected with 30 cc of 0.25% Marcaine with epinephrine, 1 cc of Toradol and 30 cc of NS for a total of 61 cc.     Final implants were then cemented onto cleaned and dried cut surfaces of bone with the knee brought to extension with a size 8 mm CR MS trial insert.      Once the cement had fully cured, excess cement was removed   throughout the knee.  I confirmed that I was satisfied with the range of   motion and stability, and the final size 8 mm FB CR MS AOX insert was chosen.  It was   placed into the knee.      The tourniquet had been let down at 34 minutes.  No significant   hemostasis was required.  The extensor mechanism was  then reapproximated using #1 Vicryl and #1 Stratafix sutures with the knee   in flexion.  The   remaining wound was  closed with 2-0 Vicryl and running 4-0 Monocryl.   The knee was cleaned, dried, dressed sterilely using Dermabond and   Aquacel dressing.  The patient was then   brought to recovery room in stable condition, tolerating the procedure   well.   Please note that Physician Assistant, Rosalene Billings, PA-C was present for the entirety of the case, and was utilized for pre-operative positioning, peri-operative retractor management, general facilitation of the procedure and for primary wound closure at the end of the case.              Madlyn Frankel Charlann Boxer, M.D.    06/16/2022 8:44 AM

## 2022-06-16 NOTE — Discharge Instructions (Signed)

## 2022-06-16 NOTE — Anesthesia Postprocedure Evaluation (Signed)
Anesthesia Post Note  Patient: Kedar Eisley  Procedure(s) Performed: TOTAL KNEE ARTHROPLASTY (Right: Knee)     Patient location during evaluation: PACU Anesthesia Type: Regional, Spinal and General Level of consciousness: awake Pain management: pain level controlled Vital Signs Assessment: post-procedure vital signs reviewed and stable Respiratory status: spontaneous breathing, nonlabored ventilation and respiratory function stable Cardiovascular status: blood pressure returned to baseline and stable Postop Assessment: no apparent nausea or vomiting Anesthetic complications: no   No notable events documented.  Last Vitals:  Vitals:   06/16/22 1030 06/16/22 1045  BP: (!) 152/70 (!) 156/60  Pulse: 65 (!) 58  Resp: 15 12  Temp:    SpO2: 97% 100%    Last Pain:  Vitals:   06/16/22 1045  TempSrc:   PainSc: Asleep                 Linton Rump

## 2022-06-16 NOTE — Anesthesia Procedure Notes (Signed)
Procedure Name: LMA Insertion Date/Time: 06/16/2022 7:44 AM  Performed by: Doran Clay, CRNAPre-anesthesia Checklist: Emergency Drugs available, Patient identified, Suction available, Patient being monitored and Timeout performed Patient Re-evaluated:Patient Re-evaluated prior to induction Oxygen Delivery Method: Circle system utilized Preoxygenation: Pre-oxygenation with 100% oxygen Induction Type: IV induction LMA: LMA inserted LMA Size: 5.0 Tube type: Oral Number of attempts: 1 Placement Confirmation: breath sounds checked- equal and bilateral and positive ETCO2 Tube secured with: Tape Dental Injury: Teeth and Oropharynx as per pre-operative assessment

## 2022-06-16 NOTE — Transfer of Care (Signed)
Immediate Anesthesia Transfer of Care Note  Patient: Baltasar Visalli  Procedure(s) Performed: TOTAL KNEE ARTHROPLASTY (Right: Knee)  Patient Location: PACU  Anesthesia Type:General  Level of Consciousness: sedated  Airway & Oxygen Therapy: Patient Spontanous Breathing and Patient connected to face mask oxygen  Post-op Assessment: Report given to RN and Post -op Vital signs reviewed and stable  Post vital signs: Reviewed and stable  Last Vitals:  Vitals Value Taken Time  BP 159/66 06/16/22 0857  Temp    Pulse 73 06/16/22 0858  Resp 18 06/16/22 0858  SpO2 100 % 06/16/22 0858  Vitals shown include unvalidated device data.  Last Pain:  Vitals:   06/16/22 0635  TempSrc: Oral  PainSc:          Complications: No notable events documented.

## 2022-06-16 NOTE — Plan of Care (Signed)
  Problem: Education: Goal: Knowledge of the prescribed therapeutic regimen will improve Outcome: Progressing   Problem: Activity: Goal: Range of joint motion will improve Outcome: Progressing   Problem: Pain Management: Goal: Pain level will decrease with appropriate interventions Outcome: Progressing   Problem: Safety: Goal: Ability to remain free from injury will improve Outcome: Progressing   

## 2022-06-16 NOTE — Evaluation (Signed)
Physical Therapy Evaluation Patient Details Name: Benjamin [name] MRN: [id number] DOB: [birth year] Today's Date: 06/16/2022  History of Present Illness  82 yo male presents to therapy s/p R TKA on 06/16/2022 due to failure of conservative measures. Pt PMH includes but is not limited to: HTN, DM II, IBS, HDL, R THA (2018), and L TKA (2016).  Clinical Impression    Benjamin [name] is a 82 y.o. male POD 0 s/p R TKA. Patient reports IND with mobility at baseline. Patient is now limited by functional impairments (see PT problem list below) and requires min guard for bed mobility and min A and cues for transfers. Patient was able to ambulate 25 feet with RW and min guard level of assist. Patient instructed in exercise to facilitate ROM and circulation to manage edema. Patient will benefit from continued skilled PT interventions to address impairments and progress towards PLOF. Acute PT will follow to progress mobility and stair training in preparation for safe discharge home with significant other and OPPT services.      Recommendations for follow up therapy are one component of a multi-disciplinary discharge planning process, led by the attending physician.  Recommendations may be updated based on patient status, additional functional criteria and insurance authorization.  Follow Up Recommendations       Assistance Recommended at Discharge Intermittent Supervision/Assistance  Patient can return home with the following  A little help with walking and/or transfers;A little help with bathing/dressing/bathroom;Assistance with cooking/housework;Assist for transportation;Help with stairs or ramp for entrance    Equipment Recommendations None recommended by PT (pt reports DME in home setting)  Recommendations for Other Services       Functional Status Assessment Patient has had a recent decline in their functional status and demonstrates the ability to make significant improvements in function in a reasonable  and predictable amount of time.     Precautions / Restrictions Precautions Precautions: Knee;Fall Restrictions Weight Bearing Restrictions: No      Mobility  Bed Mobility Overal bed mobility: Needs Assistance Bed Mobility: Supine to Sit     Supine to sit: Min guard, HOB elevated     General bed mobility comments: cues for IND with task    Transfers Overall transfer level: Needs assistance Equipment used: Rolling walker (2 wheels) Transfers: Sit to/from Stand Sit to Stand: Min assist           General transfer comment: cues for proper UE, R LE and AD placement    Ambulation/Gait Ambulation/Gait assistance: Min guard Gait Distance (Feet): 25 Feet Assistive device: Rolling walker (2 wheels) Gait Pattern/deviations: Step-to pattern, Antalgic, Trunk flexed Gait velocity: decreased     General Gait Details: heavy reliance on B UE support at [employer]    Modified Rankin (Stroke Patients Only)       Balance Overall balance assessment: Needs assistance Sitting-balance support: Feet supported Sitting balance-Leahy Scale: Good     Standing balance support: Bilateral upper extremity supported, During functional activity, Reliant on assistive device for balance Standing balance-Leahy Scale: Poor                               Pertinent Vitals/Pain Pain Assessment Pain Assessment: 0-10 Pain Score: 6  Pain Location: R knee Pain Descriptors / Indicators: Sharp, Shooting, Operative site guarding Pain Intervention(s): Limited activity within patient's tolerance, Monitored during session, Premedicated before session, Repositioned, Ice  applied (pt indicated no pain at rest and sharp shooting pain with gait tasks)    Home Living Family/patient expects to be discharged to:: Private residence Living Arrangements: Spouse/significant other (pt is to transition home with Sofie Rower) Available Help at Discharge:  Friend(s) Type of Home: House Home Access: Stairs to enter Entrance Stairs-Rails: Left Entrance Stairs-Number of Steps: 3   Home Layout: One level Home Equipment: Agricultural consultant (2 wheels);Cane - single point;Crutches Technical brewer)      Prior Function Prior Level of Function : Independent/Modified Independent;Driving             Mobility Comments: IND iwith all ADLs, self care tasks, IADLs and driving       Hand Dominance        Extremity/Trunk Assessment        Lower Extremity Assessment Lower Extremity Assessment: RLE deficits/detail RLE Deficits / Details: ankle DF/PF 5/5; SLR > 10 degree lag with AA for first SLR RLE Sensation: WNL    Cervical / Trunk Assessment Cervical / Trunk Assessment:  (wfl)  Communication   Communication: HOH (B hearing aids)  Cognition Arousal/Alertness: Awake/alert Behavior During Therapy: WFL for tasks assessed/performed Overall Cognitive Status: Within Functional Limits for tasks assessed                                          General Comments      Exercises Total Joint Exercises Ankle Circles/Pumps: AROM, Both, 20 reps   Assessment/Plan    PT Assessment Patient needs continued PT services  PT Problem List Decreased strength;Decreased range of motion;Decreased activity tolerance;Decreased balance;Decreased mobility;Decreased coordination;Pain       PT Treatment Interventions DME instruction;Gait training;Stair training;Functional mobility training;Therapeutic exercise;Therapeutic activities;Balance training;Neuromuscular re-education;Patient/family education;Modalities    PT Goals (Current goals can be found in the Care Plan section)  Acute Rehab PT Goals Patient Stated Goal: to get stronger and go home, return to working out PT Goal Formulation: With patient Time For Goal Achievement: 06/30/22 Potential to Achieve Goals: Good    Frequency 7X/week     Co-evaluation                AM-PAC PT "6 Clicks" Mobility  Outcome Measure Help needed turning from your back to your side while in a flat bed without using bedrails?: A Little Help needed moving from lying on your back to sitting on the side of a flat bed without using bedrails?: A Little Help needed moving to and from a bed to a chair (including a wheelchair)?: A Little Help needed standing up from a chair using your arms (e.g., wheelchair or bedside chair)?: A Little Help needed to walk in hospital room?: A Little Help needed climbing 3-5 steps with a railing? : A Lot 6 Click Score: 17    End of Session Equipment Utilized During Treatment: Gait belt Activity Tolerance: Patient limited by fatigue (minimal increase in pain s/p tx session) Patient left: in chair;with call bell/phone within reach;with family/visitor present Nurse Communication: Mobility status PT Visit Diagnosis: Unsteadiness on feet (R26.81);Other abnormalities of gait and mobility (R26.89);Muscle weakness (generalized) (M62.81);Pain Pain - Right/Left: Right Pain - part of body: Knee    Time: 1610-9604 PT Time Calculation (min) (ACUTE ONLY): 39 min   Charges:   PT Evaluation $PT Eval Low Complexity: 1 Low PT Treatments $Gait Training: 8-22 mins $Therapeutic Activity: 8-22 mins  Rica Mote, PT   Jacqualyn Posey 06/16/2022, 4:07 PM

## 2022-06-16 NOTE — Anesthesia Procedure Notes (Signed)
Anesthesia Regional Block: Adductor canal block   Pre-Anesthetic Checklist: , timeout performed,  Correct Patient, Correct Site, Correct Laterality,  Correct Procedure, Correct Position, site marked,  Risks and benefits discussed,  Surgical consent,  Pre-op evaluation,  At surgeon's request and post-op pain management  Laterality: Right  Prep: chloraprep       Needles:  Injection technique: Single-shot  Needle Type: Echogenic Stimulator Needle     Needle Length: 9cm  Needle Gauge: 21     Additional Needles:   Procedures:,,,, ultrasound used (permanent image in chart),,    Narrative:  Start time: 06/16/2022 6:38 AM End time: 06/16/2022 6:41 AM Injection made incrementally with aspirations every 5 mL.  Performed by: Personally  Anesthesiologist: Linton Rump, MD  Additional Notes: Discussed risks and benefits of nerve block including, but not limited to, prolonged and/or permanent nerve injury involving sensory and/or motor function. Monitors were applied and a time-out was performed. The nerve and associated structures were visualized under ultrasound guidance. After negative aspiration, local anesthetic was slowly injected around the nerve. There was no evidence of high pressure during the procedure. There were no paresthesias. VSS remained stable and the patient tolerated the procedure well.

## 2022-06-16 NOTE — Anesthesia Procedure Notes (Addendum)
Spinal  Patient location during procedure: OR Start time: 06/16/2022 7:15 AM End time: 06/16/2022 7:19 AM Reason for block: surgical anesthesia Staffing Performed: anesthesiologist and resident/CRNA  Anesthesiologist: Linton Rump, MD Resident/CRNA: Doran Clay, CRNA Performed by: Linton Rump, MD Authorized by: Linton Rump, MD   Preanesthetic Checklist Completed: patient identified, IV checked, site marked, risks and benefits discussed, surgical consent, monitors and equipment checked, pre-op evaluation and timeout performed Spinal Block Patient position: sitting Prep: DuraPrep Patient monitoring: blood pressure and continuous pulse ox Approach: midline Location: L3-4 Injection technique: single-shot Needle Needle type: Pencan  Needle gauge: 24 G Needle length: 9 cm Additional Notes Risks and benefits of neuraxial anesthesia including, but not limited to, infection, bleeding, local anesthetic toxicity, headache, hypotension, back pain, block failure, etc. were discussed with the patient. The patient expressed understanding and consented to the procedure. I confirmed that the patient has no bleeding disorders and is not taking blood thinners. I confirmed the patient's last platelet count with the nurse. Monitors were applied. A time-out was performed immediately prior to the procedure. Sterile technique was used throughout the whole procedure.

## 2022-06-17 ENCOUNTER — Encounter (HOSPITAL_COMMUNITY): Payer: Self-pay | Admitting: Orthopedic Surgery

## 2022-06-17 DIAGNOSIS — E119 Type 2 diabetes mellitus without complications: Secondary | ICD-10-CM | POA: Diagnosis not present

## 2022-06-17 DIAGNOSIS — I10 Essential (primary) hypertension: Secondary | ICD-10-CM | POA: Diagnosis not present

## 2022-06-17 DIAGNOSIS — M1711 Unilateral primary osteoarthritis, right knee: Secondary | ICD-10-CM | POA: Diagnosis not present

## 2022-06-17 DIAGNOSIS — Z96652 Presence of left artificial knee joint: Secondary | ICD-10-CM | POA: Diagnosis not present

## 2022-06-17 DIAGNOSIS — Z96641 Presence of right artificial hip joint: Secondary | ICD-10-CM | POA: Diagnosis not present

## 2022-06-17 DIAGNOSIS — Z79899 Other long term (current) drug therapy: Secondary | ICD-10-CM | POA: Diagnosis not present

## 2022-06-17 DIAGNOSIS — Z87891 Personal history of nicotine dependence: Secondary | ICD-10-CM | POA: Diagnosis not present

## 2022-06-17 LAB — CBC
HCT: 42.2 % (ref 39.0–52.0)
Hemoglobin: 14.2 g/dL (ref 13.0–17.0)
MCH: 31.2 pg (ref 26.0–34.0)
MCHC: 33.6 g/dL (ref 30.0–36.0)
MCV: 92.7 fL (ref 80.0–100.0)
Platelets: 132 10*3/uL — ABNORMAL LOW (ref 150–400)
RBC: 4.55 MIL/uL (ref 4.22–5.81)
RDW: 13.7 % (ref 11.5–15.5)
WBC: 10 10*3/uL (ref 4.0–10.5)
nRBC: 0 % (ref 0.0–0.2)

## 2022-06-17 LAB — BASIC METABOLIC PANEL
Anion gap: 8 (ref 5–15)
BUN: 24 mg/dL — ABNORMAL HIGH (ref 8–23)
CO2: 24 mmol/L (ref 22–32)
Calcium: 8.1 mg/dL — ABNORMAL LOW (ref 8.9–10.3)
Chloride: 102 mmol/L (ref 98–111)
Creatinine, Ser: 1.26 mg/dL — ABNORMAL HIGH (ref 0.61–1.24)
GFR, Estimated: 57 mL/min — ABNORMAL LOW (ref >60)
Glucose, Bld: 129 mg/dL — ABNORMAL HIGH (ref 70–99)
Potassium: 4 mmol/L (ref 3.5–5.1)
Sodium: 134 mmol/L — ABNORMAL LOW (ref 135–145)

## 2022-06-17 MED ORDER — TADALAFIL 20 MG PO TABS
20.0000 mg | ORAL_TABLET | Freq: Every day | ORAL | Status: DC
  Administered 2022-06-17: 20 mg via ORAL
  Filled 2022-06-17: qty 1

## 2022-06-17 MED ORDER — SENNA 8.6 MG PO TABS: 2.0000 | ORAL_TABLET | Freq: Every day | ORAL | 0 refills | Status: AC

## 2022-06-17 MED ORDER — ALPRAZOLAM 0.5 MG PO TABS
1.0000 mg | ORAL_TABLET | Freq: Four times a day (QID) | ORAL | Status: DC
  Administered 2022-06-17: 1 mg via ORAL
  Filled 2022-06-17: qty 2

## 2022-06-17 MED ORDER — OXYCODONE HCL 5 MG PO TABS: 2.5000 mg | ORAL_TABLET | ORAL | 0 refills | Status: DC | PRN

## 2022-06-17 MED ORDER — POLYETHYLENE GLYCOL 3350 17 G PO PACK: 17.0000 g | PACK | Freq: Two times a day (BID) | ORAL | 0 refills | Status: DC

## 2022-06-17 MED ORDER — METHOCARBAMOL 500 MG PO TABS: 500.0000 mg | ORAL_TABLET | Freq: Four times a day (QID) | ORAL | 2 refills | Status: DC | PRN

## 2022-06-17 MED ORDER — ASPIRIN 81 MG PO CHEW: 81.0000 mg | CHEWABLE_TABLET | Freq: Two times a day (BID) | ORAL | 0 refills | Status: AC

## 2022-06-17 MED ORDER — NALOXONE HCL 4 MG/0.1ML NA LIQD: NASAL | 0 refills | Status: DC

## 2022-06-17 NOTE — TOC Transition Note (Signed)
Transition of Care [facility]) - CM/SW Discharge Note  Patient Details  Name: Benjamin [name] MRN: [id number] Date of Birth: [birth year]  [facility] CM/SW Contact:  Ewing Schlein, LCSW Phone Number: 06/17/2022, 11:09 AM  Clinical Narrative: Patient is expected to discharge home after working with PT. CSW met with patient to confirm discharge plan. Patient will go [facility] with OPP[facility]. Patient has a rolling walker at home, so there are no DME needs at this time. TOC signing off.    Final next level of care: OP Rehab Barriers to Discharge: No Barriers Identified  Patient Goals and CMS Choice Choice offered to / list presented to : NA  Discharge Plan and Services Additional resources added to the After Visit Summary for          DME Arranged: N/A DME Agency: NA  Social Determinants of Health (SDOH) Interventions SDOH Screenings   Food Insecurity: No Food Insecurity (06/16/2022)  Housing: Low Risk  (06/16/2022)  Transportation Needs: No Transportation Needs (06/16/2022)  Utilities: Not At Risk (06/16/2022)  Alcohol Screen: Low Risk  (01/06/2022)  Depression (PHQ2-9): Low Risk  (01/06/2022)  Financial Resource Strain: Low Risk  (01/06/2022)  Physical Activity: Sufficiently Active (01/06/2022)  Social Connections: Socially Isolated (01/06/2022)  Stress: No Stress Concern Present (01/06/2022)  Tobacco Use: Medium Risk (06/16/2022)   Readmission Risk Interventions     No data to display

## 2022-06-17 NOTE — Progress Notes (Signed)
Physical Therapy Treatment Patient Details Name: Benjamin [name] MRN: [id number] DOB: Apr 18, 1940 Today's Date: 06/17/2022   History of Present Illness 82 yo male presents to therapy s/p R TKA on 06/16/2022 due to failure of conservative measures. Pt PMH includes but is not limited to: HTN, DM II, IBS, HDL, R THA (2018), and L TKA (2016).    PT Comments    Progressing with mobility. Reviewed/practiced exercises, gait training, and stair training. Issued HEP for pt to follow at home until he begins OPPT. Encouraged him to ambulate often at home, as tolerated. All PT education completed.     Recommendations for follow up therapy are one component of a multi-disciplinary discharge planning process, led by the attending physician.  Recommendations may be updated based on patient status, additional functional criteria and insurance authorization.  Follow Up Recommendations       Assistance Recommended at Discharge Intermittent Supervision/Assistance  Patient can return home with the following A little help with walking and/or transfers;A little help with bathing/dressing/bathroom;Assistance with cooking/housework;Assist for transportation;Help with stairs or ramp for entrance   Equipment Recommendations  None recommended by PT    Recommendations for Other Services       Precautions / Restrictions Precautions Precautions: Knee;Fall Restrictions Weight Bearing Restrictions: No RLE Weight Bearing: Weight bearing as tolerated     Mobility  Bed Mobility Overal bed mobility: Needs Assistance Bed Mobility: Supine to Sit, Sit to Supine     Supine to sit: Min guard, HOB elevated Sit to supine: Min guard, HOB elevated        Transfers Overall transfer level: Needs assistance Equipment used: Rolling walker (2 wheels) Transfers: Sit to/from Stand Sit to Stand: Supervision           General transfer comment: cues for proper UE, R LE and AD placement     Ambulation/Gait Ambulation/Gait assistance: Supervision Gait Distance (Feet): 125 Feet Assistive device: Rolling walker (2 wheels) Gait Pattern/deviations: Decreased stride length, Step-through pattern       General Gait Details: Supv for safety. Pt denied dizziness.   Stairs Stairs: Yes Stairs assistance: Min guard Stair Management: One rail Left, With cane, Step to pattern, Forwards Number of Stairs: 2 General stair comments: Up and over portable stairs x 1. Cues for safety, technique, sequence   Wheelchair Mobility    Modified Rankin (Stroke Patients Only)       Balance Overall balance assessment: Needs assistance         Standing balance support: Bilateral upper extremity supported, During functional activity, Reliant on assistive device for balance Standing balance-Leahy Scale: Fair                              Cognition Arousal/Alertness: Awake/alert Behavior During Therapy: WFL for tasks assessed/performed Overall Cognitive Status: Within Functional Limits for tasks assessed                                          Exercises Total Joint Exercises Ankle Circles/Pumps: AROM, Both, 10 reps Quad Sets: AROM, Both, 10 reps Hip ABduction/ADduction: AROM, Right, 10 reps Straight Leg Raises: AROM, Right, 10 reps Knee Flexion: AROM, Right, 10 reps, Seated Goniometric ROM: ~10-75 degrees    General Comments        Pertinent Vitals/Pain Pain Assessment Pain Assessment: 0-10 Pain Score: 4  Pain Location: R knee  Pain Descriptors / Indicators: Discomfort, Sore Pain Intervention(s): Monitored during session, Ice applied, Repositioned    Home Living                          Prior Function            PT Goals (current goals can now be found in the care plan section) Progress towards PT goals: Progressing toward goals    Frequency    7X/week      PT Plan Current plan remains appropriate     Co-evaluation              AM-PAC PT "6 Clicks" Mobility   Outcome Measure  Help needed turning from your back to your side while in a flat bed without using bedrails?: None Help needed moving from lying on your back to sitting on the side of a flat bed without using bedrails?: None Help needed moving to and from a bed to a chair (including a wheelchair)?: A Little Help needed standing up from a chair using your arms (e.g., wheelchair or bedside chair)?: A Little Help needed to walk in hospital room?: A Little Help needed climbing 3-5 steps with a railing? : A Little 6 Click Score: 20    End of Session Equipment Utilized During Treatment: Gait belt Activity Tolerance: Patient tolerated treatment well Patient left: in bed;with call bell/phone within reach   PT Visit Diagnosis: Unsteadiness on feet (R26.81);Other abnormalities of gait and mobility (R26.89);Muscle weakness (generalized) (M62.81);Pain Pain - Right/Left: Right Pain - part of body: Knee     Time: 0940-1003 PT Time Calculation (min) (ACUTE ONLY): 23 min  Charges:  $Gait Training: 8-22 mins $Therapeutic Exercise: 8-22 mins                        Faye Ramsay, PT Acute Rehabilitation  Office: [phone]

## 2022-06-17 NOTE — Progress Notes (Signed)
Subjective: 1 Day Post-Op Procedure(s) (LRB): TOTAL KNEE ARTHROPLASTY (Right) Patient reports pain as mild.   Patient seen in rounds for Dr. Charlann Boxer. Patient is well, and has had no acute complaints or problems. No acute events overnight. Foley catheter removed. Patient ambulated 25 feet with PT. He tells me he normally uses Cialis and flomax for BPH, and we will reorder these. We will start therapy today.   Objective: Vital signs in last 24 hours: Temp:  [97 F (36.1 C)-97.7 F (36.5 C)] 97.7 F (36.5 C) (05/08 0516) Pulse Rate:  [53-96] 70 (05/08 0516) Resp:  [10-20] 16 (05/08 0516) BP: (139-172)/(60-86) 151/72 (05/08 0516) SpO2:  [92 %-100 %] 98 % (05/08 0516)  Intake/Output from previous day:  Intake/Output Summary (Last 24 hours) at 06/17/2022 0740 Last data filed at 06/17/2022 0658 Gross per 24 hour  Intake 3684.6 ml  Output 1780 ml  Net 1904.6 ml     Intake/Output this shift: No intake/output data recorded.  Labs: Recent Labs    06/16/22 0906 06/17/22 0334  HGB 14.9 14.2   Recent Labs    06/16/22 0906 06/17/22 0334  WBC 5.7 10.0  RBC 4.78 4.55  HCT 45.3 42.2  PLT 97* 132*   Recent Labs    06/16/22 0538 06/17/22 0334  NA 134* 134*  K 3.7 4.0  CL 101 102  CO2 24 24  BUN 19 24*  CREATININE 1.10 1.26*  GLUCOSE 144* 129*  CALCIUM 8.7* 8.1*   No results for input(s): "LABPT", "INR" in the last 72 hours.  Exam: General - Patient is Alert and Oriented Extremity - Neurologically intact Sensation intact distally Intact pulses distally Dorsiflexion/Plantar flexion intact Dressing - dressing C/D/I Motor Function - intact, moving foot and toes well on exam.   Past Medical History:  Diagnosis Date   Allergy    Anxiety    Arthritis    BPH (benign prostatic hyperplasia)    with elevated PSA, sees Dr. Retta Diones, benign   Cataract    Elevated PSA    sees Dr. Retta Diones    GERD (gastroesophageal reflux disease)    Hyperlipidemia    Hypertension     Insomnia    Platelets decreased (HCC)    Pneumonia    walking PNA   Pre-diabetes    No meds    Assessment/Plan: 1 Day Post-Op Procedure(s) (LRB): TOTAL KNEE ARTHROPLASTY (Right) Principal Problem:   S/P total knee arthroplasty, right  Estimated body mass index is 30.27 kg/m as calculated from the following:   Height as of this encounter: 5\' 11"  (1.803 m).   Weight as of this encounter: 98.4 kg. Advance diet Up with therapy D/C IV fluids   Patient's anticipated LOS is less than 2 midnights, meeting these requirements: - Younger than 48 - Lives within 1 hour of care - Has a competent adult at home to recover with post-op recover - NO history of  - Chronic pain requiring opiods  - Diabetes  - Coronary Artery Disease  - Heart failure  - Heart attack  - Stroke  - DVT/VTE  - Cardiac arrhythmia  - Respiratory Failure/COPD  - Renal failure  - Anemia  - Advanced Liver disease     DVT Prophylaxis - Aspirin Weight bearing as tolerated.  Hgb stable at 14.2 this AM. Cr elevated to 1.26, will d/c celebrex  Plan is to go Home after hospital stay. Plan for discharge today following 1-2 sessions of PT as long as they are meeting their goals. Patient  is scheduled for OPPT. Follow up in the office in 2 weeks.   Dennie Bible, PA-C Orthopedic Surgery [phone] 06/17/2022, 7:40 AM

## 2022-06-19 DIAGNOSIS — M25561 Pain in right knee: Secondary | ICD-10-CM | POA: Diagnosis not present

## 2022-06-22 DIAGNOSIS — M25561 Pain in right knee: Secondary | ICD-10-CM | POA: Diagnosis not present

## 2022-06-24 DIAGNOSIS — M25561 Pain in right knee: Secondary | ICD-10-CM | POA: Diagnosis not present

## 2022-06-25 NOTE — Discharge Summary (Signed)
Patient ID: Benjamin [name] MRN: [id number] DOB/AGE: [birth year] 81 y.o.  Admit date: 06/16/2022 Discharge date: 06/17/2022  Admission Diagnoses:  Right knee osteoarthritis  Discharge Diagnoses:  Principal Problem:   S/P total knee arthroplasty, right   Past Medical History:  Diagnosis Date   Allergy    Anxiety    Arthritis    BPH (benign prostatic hyperplasia)    with elevated PSA, sees Dr. Retta [name], benign   Cataract    Elevated PSA    sees Dr. Retta [name]    GERD (gastroesophageal reflux disease)    Hyperlipidemia    Hypertension    Insomnia    Platelets decreased (HCC)    Pneumonia    walking PNA   Pre-diabetes    No meds    Surgeries: Procedure(s): TOTAL KNEE ARTHROPLASTY on 06/16/2022   Consultants:   Discharged Condition: Improved  Hospital Course: Benjamin [name] is an 82 y.o. male who was admitted 06/16/2022 for operative treatment ofS/P total knee arthroplasty, right. Patient has severe unremitting pain that affects sleep, daily activities, and work/hobbies. After pre-op clearance the patient was taken to the operating room on 06/16/2022 and underwent  Procedure(s): TOTAL KNEE ARTHROPLASTY.    Patient was given perioperative antibiotics:  Anti-infectives (From admission, onward)    Start     Dose/Rate Route Frequency Ordered Stop   06/16/22 1400  ceFAZolin (ANCEF) IVPB 2g/100 mL premix        2 g 200 mL/hr over 30 Minutes Intravenous Every 6 hours 06/16/22 1309 06/16/22 2129   06/16/22 0600  ceFAZolin (ANCEF) IVPB 2g/100 mL premix        2 g 200 mL/hr over 30 Minutes Intravenous On call to O.R. 06/16/22 6578 06/16/22 4696        Patient was given sequential compression devices, early ambulation, and chemoprophylaxis to prevent DVT. Patient worked with PT and was meeting their goals regarding safe ambulation and transfers.  Patient benefited maximally from hospital stay and there were no complications.    Recent vital signs: No data found.   Recent  laboratory studies: No results for input(s): "WBC", "HGB", "HCT", "PLT", "NA", "K", "CL", "CO2", "BUN", "CREATININE", "GLUCOSE", "INR", "CALCIUM" in the last 72 hours.  Invalid input(s): "PT", "2"   Discharge Medications:   Allergies as of 06/17/2022       Reactions   Norco [hydrocodone-acetaminophen]    Hallucinations        Medication List     STOP taking these medications    naproxen sodium 220 MG tablet Commonly known as: ALEVE       TAKE these medications    ALPRAZolam 1 MG tablet Commonly known as: XANAX Take 1 tablet (1 mg total) by mouth every 6 (six) hours as needed for anxiety.   aspirin 81 MG chewable tablet Chew 1 tablet (81 mg total) by mouth 2 (two) times daily for 28 days.   atorvastatin 20 MG tablet Commonly known as: LIPITOR Take 1 tablet (20 mg total) by mouth daily.   latanoprost 0.005 % ophthalmic solution Commonly known as: XALATAN SMARTSIG:In Eye(s)   lisinopril-hydrochlorothiazide 10-12.5 MG tablet Commonly known as: ZESTORETIC TAKE ONE TABLET BY MOUTH DAILY   methocarbamol 500 MG tablet Commonly known as: ROBAXIN Take 1 tablet (500 mg total) by mouth every 6 (six) hours as needed for muscle spasms.   naloxone 4 MG/0.1ML Liqd nasal spray kit Commonly known as: NARCAN Spray into nostril with signs of opioid related oversedation or overdose   omeprazole 40 MG  capsule Commonly known as: PRILOSEC TAKE ONE CAPSULE BY MOUTH TWICE DAILY   oxyCODONE 5 MG immediate release tablet Commonly known as: Oxy IR/ROXICODONE Take 0.5-1 tablets (2.5-5 mg total) by mouth every 4 (four) hours as needed for severe pain.   polyethylene glycol 17 g packet Commonly known as: MIRALAX / GLYCOLAX Take 17 g by mouth 2 (two) times daily. What changed:  when to take this reasons to take this   PRESERVISION AREDS PO Take by mouth.   Centrum Silver 50+Men Tabs Take 1 tablet by mouth daily.   senna 8.6 MG Tabs tablet Commonly known as: SENOKOT Take 2  tablets (17.2 mg total) by mouth at bedtime for 14 days.   SYRINGE 3CC/22GX1-1/2" 22G X 1-1/2" 3 ML Misc 1 Application by Does not apply route every 7 (seven) days.   tadalafil 20 MG tablet Commonly known as: CIALIS Take 1 tablet (20 mg total) by mouth as needed for erectile dysfunction. What changed: when to take this   tamsulosin 0.4 MG Caps capsule Commonly known as: FLOMAX TAKE ONE CAPSULE BY MOUTH DAILY   temazepam 30 MG capsule Commonly known as: RESTORIL TAKE ONE CAPSULE BY MOUTH AT BEDTIME AS NEEDED FOR SLEEP   testosterone cypionate 200 MG/ML injection Commonly known as: DEPOTESTOSTERONE CYPIONATE Inject 1 mL (200 mg total) into the muscle every 7 (seven) days.               Discharge Care Instructions  (From admission, onward)           Start     Ordered   06/17/22 0000  Change dressing       Comments: Maintain surgical dressing until follow up in the clinic. If the edges start to pull up, may reinforce with tape. If the dressing is no longer working, may remove and cover with gauze and tape, but must keep the area dry and clean.  Call with any questions or concerns.   06/17/22 0744            Diagnostic Studies: No results found.  Disposition: Discharge disposition: 01-Home or Self Care       Discharge Instructions     Call MD / Call 911   Complete by: As directed    If you experience chest pain or shortness of breath, CALL 911 and be transported to the hospital emergency room.  If you develope a fever above 101 F, pus (white drainage) or increased drainage or redness at the wound, or calf pain, call your surgeon's office.   Change dressing   Complete by: As directed    Maintain surgical dressing until follow up in the clinic. If the edges start to pull up, may reinforce with tape. If the dressing is no longer working, may remove and cover with gauze and tape, but must keep the area dry and clean.  Call with any questions or concerns.    Constipation Prevention   Complete by: As directed    Drink plenty of fluids.  Prune juice may be helpful.  You may use a stool softener, such as Colace (over the counter) 100 mg twice a day.  Use MiraLax (over the counter) for constipation as needed.   Diet - low sodium heart healthy   Complete by: As directed    Increase activity slowly as tolerated   Complete by: As directed    Weight bearing as tolerated with assist device (walker, cane, etc) as directed, use it as long as suggested by your  surgeon or therapist, typically at least 4-6 weeks.   Post-operative opioid taper instructions:   Complete by: As directed    POST-OPERATIVE OPIOID TAPER INSTRUCTIONS: It is important to wean off of your opioid medication as soon as possible. If you do not need pain medication after your surgery it is ok to stop day one. Opioids include: Codeine, Hydrocodone(Norco, Vicodin), Oxycodone(Percocet, oxycontin) and hydromorphone amongst others.  Long term and even short term use of opiods can cause: Increased pain response Dependence Constipation Depression Respiratory depression And more.  Withdrawal symptoms can include Flu like symptoms Nausea, vomiting And more Techniques to manage these symptoms Hydrate well Eat regular healthy meals Stay active Use relaxation techniques(deep breathing, meditating, yoga) Do Not substitute Alcohol to help with tapering If you have been on opioids for less than two weeks and do not have pain than it is ok to stop all together.  Plan to wean off of opioids This plan should start within one week post op of your joint replacement. Maintain the same interval or time between taking each dose and first decrease the dose.  Cut the total daily intake of opioids by one tablet each day Next start to increase the time between doses. The last dose that should be eliminated is the evening dose.      TED hose   Complete by: As directed    Use stockings (TED hose) for  2 weeks on both leg(s).  You may remove them at night for sleeping.        Follow-up Information     Durene Romans, MD. Schedule an appointment as soon as possible for a visit in 2 week(s).   Specialty: Orthopedic Surgery Contact information: [address] [place] [address] [phone]                  Signed: Cassandria Anger 06/25/2022, 7:29 AM

## 2022-06-29 ENCOUNTER — Other Ambulatory Visit: Payer: Self-pay

## 2022-06-29 DIAGNOSIS — M25561 Pain in right knee: Secondary | ICD-10-CM | POA: Diagnosis not present

## 2022-06-30 ENCOUNTER — Ambulatory Visit (HOSPITAL_BASED_OUTPATIENT_CLINIC_OR_DEPARTMENT_OTHER): Payer: Medicare Other | Admitting: Physical Therapy

## 2022-07-02 DIAGNOSIS — R3912 Poor urinary stream: Secondary | ICD-10-CM | POA: Diagnosis not present

## 2022-07-02 DIAGNOSIS — N401 Enlarged prostate with lower urinary tract symptoms: Secondary | ICD-10-CM | POA: Diagnosis not present

## 2022-07-02 DIAGNOSIS — E291 Testicular hypofunction: Secondary | ICD-10-CM | POA: Diagnosis not present

## 2022-07-02 DIAGNOSIS — N5201 Erectile dysfunction due to arterial insufficiency: Secondary | ICD-10-CM | POA: Diagnosis not present

## 2022-07-03 DIAGNOSIS — M25561 Pain in right knee: Secondary | ICD-10-CM | POA: Diagnosis not present

## 2022-07-07 ENCOUNTER — Other Ambulatory Visit: Payer: Self-pay | Admitting: Family Medicine

## 2022-07-07 DIAGNOSIS — E785 Hyperlipidemia, unspecified: Secondary | ICD-10-CM

## 2022-07-08 DIAGNOSIS — M25561 Pain in right knee: Secondary | ICD-10-CM | POA: Diagnosis not present

## 2022-07-10 DIAGNOSIS — M25561 Pain in right knee: Secondary | ICD-10-CM | POA: Diagnosis not present

## 2022-07-14 DIAGNOSIS — M25561 Pain in right knee: Secondary | ICD-10-CM | POA: Diagnosis not present

## 2022-07-16 DIAGNOSIS — M25561 Pain in right knee: Secondary | ICD-10-CM | POA: Diagnosis not present

## 2022-07-21 DIAGNOSIS — M25561 Pain in right knee: Secondary | ICD-10-CM | POA: Diagnosis not present

## 2022-07-22 ENCOUNTER — Encounter: Payer: Self-pay | Admitting: Family Medicine

## 2022-07-23 DIAGNOSIS — M25561 Pain in right knee: Secondary | ICD-10-CM | POA: Diagnosis not present

## 2022-07-24 NOTE — Telephone Encounter (Signed)
Let's decrease the dosing to every 2 weeks. Recheck a level in 90 days

## 2022-07-24 NOTE — Telephone Encounter (Signed)
Pt is asking for a call back at your earliest convenience.

## 2022-07-27 ENCOUNTER — Other Ambulatory Visit: Payer: Self-pay

## 2022-07-27 ENCOUNTER — Ambulatory Visit (INDEPENDENT_AMBULATORY_CARE_PROVIDER_SITE_OTHER): Payer: Medicare Other | Admitting: Family Medicine

## 2022-07-27 ENCOUNTER — Encounter: Payer: Self-pay | Admitting: Family Medicine

## 2022-07-27 VITALS — BP 138/74 | HR 74 | Temp 98.5°F | Wt 221.0 lb

## 2022-07-27 DIAGNOSIS — N401 Enlarged prostate with lower urinary tract symptoms: Secondary | ICD-10-CM | POA: Diagnosis not present

## 2022-07-27 DIAGNOSIS — N138 Other obstructive and reflux uropathy: Secondary | ICD-10-CM

## 2022-07-27 DIAGNOSIS — N529 Male erectile dysfunction, unspecified: Secondary | ICD-10-CM

## 2022-07-27 LAB — TESTOSTERONE: Testosterone: 556.93 ng/dL (ref 300.00–890.00)

## 2022-07-27 MED ORDER — OMEPRAZOLE 40 MG PO CPDR: 40.0000 mg | DELAYED_RELEASE_CAPSULE | Freq: Two times a day (BID) | ORAL | 0 refills | Status: DC

## 2022-07-27 MED ORDER — TAMSULOSIN HCL 0.4 MG PO CAPS: 0.8000 mg | ORAL_CAPSULE | Freq: Every day | ORAL | 3 refills | Status: DC

## 2022-07-27 NOTE — Progress Notes (Signed)
   Subjective:    Patient ID: Benjamin [name], male    DOB: 03-13-1940, 82 y.o.   MRN: [id number]  HPI Here to follow up on urologic issues. He has been injecting 200 mg of testosterone cypionate weekly. When we last checked a level in January, this iwas 303. The level was checked by his urologist, Dr. Alvester Morin, on 07-02-22, and this was 1500. He is here for advice. His last shot was given 8 days ago. The other issue is the BPH. He had gotten good results by taking Flomax 0.4 mg daily, but lately this has not been working as well. He gets up 5-6 times a night to urinate. There is no discomfort.    Review of Systems  Constitutional: Negative.   Respiratory: Negative.    Cardiovascular: Negative.   Genitourinary:  Positive for difficulty urinating and frequency. Negative for dysuria, hematuria and urgency.       Objective:   Physical Exam Constitutional:      Appearance: Normal appearance.  Cardiovascular:     Rate and Rhythm: Normal rate and regular rhythm.     Pulses: Normal pulses.     Heart sounds: Normal heart sounds.  Pulmonary:     Effort: Pulmonary effort is normal.     Breath sounds: Normal breath sounds.  Neurological:     Mental Status: He is alert.           Assessment & Plan:  For the BPH, we will increase the Flomax to a total of 0.8 mg daily. For the ED, we will check another testosterone level today to make sure it has come back down. If so we plan for him to inject 0.5 ml ( 100 mg) weekly for 2 months and then check a level.  Gershon Crane, MD

## 2022-07-28 DIAGNOSIS — M25561 Pain in right knee: Secondary | ICD-10-CM | POA: Diagnosis not present

## 2022-07-30 DIAGNOSIS — M25561 Pain in right knee: Secondary | ICD-10-CM | POA: Diagnosis not present

## 2022-08-03 ENCOUNTER — Other Ambulatory Visit: Payer: Self-pay | Admitting: Urology

## 2022-08-03 DIAGNOSIS — R972 Elevated prostate specific antigen [PSA]: Secondary | ICD-10-CM

## 2022-08-07 DIAGNOSIS — Z96651 Presence of right artificial knee joint: Secondary | ICD-10-CM | POA: Diagnosis not present

## 2022-08-14 ENCOUNTER — Other Ambulatory Visit (HOSPITAL_COMMUNITY): Payer: Self-pay | Admitting: Urology

## 2022-08-14 DIAGNOSIS — R972 Elevated prostate specific antigen [PSA]: Secondary | ICD-10-CM

## 2022-08-20 ENCOUNTER — Ambulatory Visit (HOSPITAL_COMMUNITY)
Admission: RE | Admit: 2022-08-20 | Discharge: 2022-08-20 | Disposition: A | Discharge status: Home / Self Care | Payer: Medicare Other | Source: Ambulatory Visit | Attending: Urology | Admitting: Urology

## 2022-08-20 DIAGNOSIS — R972 Elevated prostate specific antigen [PSA]: Secondary | ICD-10-CM | POA: Insufficient documentation

## 2022-08-20 DIAGNOSIS — N400 Enlarged prostate without lower urinary tract symptoms: Secondary | ICD-10-CM | POA: Diagnosis not present

## 2022-08-20 DIAGNOSIS — M25452 Effusion, left hip: Secondary | ICD-10-CM | POA: Diagnosis not present

## 2022-08-20 MED ORDER — GADOBUTROL 1 MMOL/ML IV SOLN
10.0000 mL | Freq: Once | INTRAVENOUS | Status: AC | PRN
  Administered 2022-08-20: 10 mL via INTRAVENOUS

## 2022-09-03 DIAGNOSIS — R972 Elevated prostate specific antigen [PSA]: Secondary | ICD-10-CM | POA: Diagnosis not present

## 2022-09-03 DIAGNOSIS — N401 Enlarged prostate with lower urinary tract symptoms: Secondary | ICD-10-CM | POA: Diagnosis not present

## 2022-09-03 DIAGNOSIS — R3912 Poor urinary stream: Secondary | ICD-10-CM | POA: Diagnosis not present

## 2022-09-03 DIAGNOSIS — E291 Testicular hypofunction: Secondary | ICD-10-CM | POA: Diagnosis not present

## 2022-09-12 ENCOUNTER — Other Ambulatory Visit: Payer: Self-pay | Admitting: Family Medicine

## 2022-09-14 ENCOUNTER — Other Ambulatory Visit: Payer: Self-pay | Admitting: Urology

## 2022-09-14 ENCOUNTER — Other Ambulatory Visit: Payer: Self-pay | Admitting: Family Medicine

## 2022-09-14 DIAGNOSIS — N401 Enlarged prostate with lower urinary tract symptoms: Secondary | ICD-10-CM

## 2022-09-14 NOTE — Telephone Encounter (Signed)
Prescription Request  09/14/2022  LOV: 07/27/2022  What is the name of the medication or equipment? testosterone cypionate (DEPOTESTOSTERONE CYPIONATE) 200 MG/ML injection   Have you contacted your pharmacy to request a refill? Yes   Which pharmacy would you like this sent to?   [facility] [place], [place] - [address] [facility] [address] [facility] [address] [place] [address] Phone: [phone] Fax: [phone]    Patient notified that their request is being sent to the clinical staff for review and that they should receive a response within 2 business days.   Please advise at Mobile [phone] (mobile)

## 2022-09-14 NOTE — Telephone Encounter (Signed)
Pt called to F/U on these 2 refills.   [facility] [place], [place] - [address] [place] Phone: [phone]  Fax: [phone]

## 2022-09-15 ENCOUNTER — Other Ambulatory Visit: Payer: Self-pay | Admitting: Family Medicine

## 2022-09-15 MED ORDER — TESTOSTERONE CYPIONATE 200 MG/ML IM SOLN: 200.0000 mg | INTRAMUSCULAR | 2 refills | Status: DC

## 2022-09-15 NOTE — Telephone Encounter (Signed)
Done

## 2022-09-18 DIAGNOSIS — H53431 Sector or arcuate defects, right eye: Secondary | ICD-10-CM | POA: Diagnosis not present

## 2022-09-18 DIAGNOSIS — H401132 Primary open-angle glaucoma, bilateral, moderate stage: Secondary | ICD-10-CM | POA: Diagnosis not present

## 2022-09-18 DIAGNOSIS — H53432 Sector or arcuate defects, left eye: Secondary | ICD-10-CM | POA: Diagnosis not present

## 2022-10-13 ENCOUNTER — Telehealth: Payer: Self-pay | Admitting: Family Medicine

## 2022-10-13 NOTE — Telephone Encounter (Signed)
Requesting permission for early fill for ALPRAZolam (XANAX) 1 MG tablet temazepam (RESTORIL) 30 MG capsule  testosterone cypionate (DEPOTESTOSTERONE CYPIONATE) 200 MG/ML injection and Syringe/Needle, Disp, (SYRINGE 3CC/22GX1-1/2") 22G X 1-1/2" 3 ML MISC going on vacation outside of country on 10/27/22. Asking if you need to see him prior to him leaving the country and for a call to confirm prescriptions were sent

## 2022-10-14 ENCOUNTER — Other Ambulatory Visit: Payer: Self-pay | Admitting: Family Medicine

## 2022-10-14 NOTE — Telephone Encounter (Signed)
Please okay these early refills

## 2022-10-15 ENCOUNTER — Telehealth: Payer: Self-pay | Admitting: Family Medicine

## 2022-10-15 NOTE — Telephone Encounter (Signed)
Patient is leaving for [place] on 10/27/22- he needs an early refill on Temazepam and Cialis called in to the pharmacy because he will need a refill while he is out of the country.  Please give pt a return call.  Pharmacy- [facility]

## 2022-10-15 NOTE — Telephone Encounter (Signed)
Spoke with pt pharmacy advised per Dr Clent Ridges to early refill requested Rx, verbalized understanding

## 2022-10-19 MED ORDER — TEMAZEPAM 30 MG PO CAPS: ORAL_CAPSULE | ORAL | 5 refills | Status: DC

## 2022-10-19 NOTE — Telephone Encounter (Signed)
I sent in a refill for Temazepam. Please call the pharmacy to okay the early refills

## 2022-10-22 NOTE — Telephone Encounter (Signed)
Spoke with pt pharmacy and pt stated that pt picked up Rx already and he is set with his Temazepam

## 2022-11-13 ENCOUNTER — Encounter: Payer: Self-pay | Admitting: Family Medicine

## 2022-11-13 ENCOUNTER — Ambulatory Visit: Payer: Medicare Other | Admitting: Family Medicine

## 2022-11-13 ENCOUNTER — Ambulatory Visit (INDEPENDENT_AMBULATORY_CARE_PROVIDER_SITE_OTHER): Payer: Medicare Other | Admitting: Family Medicine

## 2022-11-13 VITALS — BP 120/78 | HR 59 | Temp 98.3°F | Wt 225.0 lb

## 2022-11-13 DIAGNOSIS — J40 Bronchitis, not specified as acute or chronic: Secondary | ICD-10-CM

## 2022-11-13 MED ORDER — AMOXICILLIN-POT CLAVULANATE 875-125 MG PO TABS: 1.0000 | ORAL_TABLET | Freq: Two times a day (BID) | ORAL | 0 refills | Status: DC

## 2022-11-13 NOTE — Progress Notes (Signed)
Subjective:    Patient ID: Benjamin [name], male    DOB: 09-Jun-1940, 82 y.o.   MRN: [id number]  HPI Here for one week of stuffy head, PND, chest congestion and coughing up green sputum. No fever or SOB. He tested negative for Covid 3 days ago.    Review of Systems  Constitutional: Negative.   HENT:  Positive for congestion, postnasal drip and sinus pressure. Negative for ear pain and sore throat.   Eyes: Negative.   Respiratory:  Positive for cough. Negative for shortness of breath and wheezing.        Objective:   Physical Exam Constitutional:      Appearance: Normal appearance.  HENT:     Right Ear: Tympanic membrane, ear canal and external ear normal.     Left Ear: Tympanic membrane, ear canal and external ear normal.     Nose: Nose normal.     Mouth/Throat:     Pharynx: Oropharynx is clear.  Eyes:     Conjunctiva/sclera: Conjunctivae normal.  Pulmonary:     Effort: Pulmonary effort is normal.     Breath sounds: Rhonchi present. No wheezing or rales.  Lymphadenopathy:     Cervical: No cervical adenopathy.  Neurological:     Mental Status: He is alert.           Assessment & Plan:  Bronchitis, treat with 10 days of Augmentin. Gershon Crane, MD

## 2022-11-18 ENCOUNTER — Ambulatory Visit
Admission: RE | Admit: 2022-11-18 | Discharge: 2022-11-18 | Disposition: A | Discharge status: Home / Self Care | Payer: Medicare Other | Source: Ambulatory Visit | Attending: Urology | Admitting: Urology

## 2022-11-18 DIAGNOSIS — N401 Enlarged prostate with lower urinary tract symptoms: Secondary | ICD-10-CM

## 2022-11-18 HISTORY — PX: IR RADIOLOGIST EVAL & MGMT: IMG5224

## 2022-11-18 NOTE — Consult Note (Signed)
Chief Complaint: Patient was seen in consultation today for prostatomegaly with lower urinary tract symptoms, presenting today via virtual telephone clinic visit  Referring Physician(s): Bell,Eugene D III  History of Present Illness: Benjamin [name] is a 82 y.o. male with history of chronic benign prostatic hyperplasia with lower urinary tract symptoms and chronic mild elevation of prostate specific antigen since at least 2009.  He underwent prostate biopsy in 2010 which was negative.  He is now followed by Dr. Alvester Morin, and recent MRI prostate (August 20, 2022) demonstrated a 192 g gland with 2, PI-RADS 4 lesions.  Dr. Alvester Morin and the patient have elected to observe these lesions as his most recent PSA (13.8) is concordant with degree of prostatomegaly.  He is hopeful to stop taking tamsulosin, as he is suspicious that his flomax is preventing ejaculation/orgasm.      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    Past Medical History:  Diagnosis Date   Allergy    Anxiety    Arthritis    BPH (benign prostatic hyperplasia)    with elevated PSA, sees Dr. Retta Diones, benign   Cataract    Elevated PSA    sees Dr. Retta Diones    GERD (gastroesophageal reflux disease)    Hyperlipidemia    Hypertension    Insomnia    Platelets decreased (HCC)    Pneumonia    walking PNA   Pre-diabetes    No meds    Past Surgical History:  Procedure Laterality Date   CHOLECYSTECTOMY N/A 01/10/2014   Procedure: LAPAROSCOPIC CHOLECYSTECTOMY WITH INTRAOPERATIVE CHOLANGIOGRAM;  Surgeon: Valarie Merino, MD;  Location: [facility];  Service: General;  Laterality: N/A;   COLONOSCOPY  08/03/2018   per Dr. Leone Payor, adenomatous polyps, no repeats needed    JOINT REPLACEMENT     Right hip arthroplasty Dr. Charlann Boxer 01/05/17    left knee arthroscopy      LIPOSUCTION  1987   prostate biopsies in 2003 and again 05/2008     per Dr. Retta Diones. benign   REFRACTIVE SURGERY Bilateral    cataract surgery   repair   of large umblicial hernia     per Dr. Wenda Low on 01/10/10   right knee arthroscopy     TONSILLECTOMY     TOTAL HIP ARTHROPLASTY Right 01/05/2017   Procedure: RIGHT TOTAL HIP ARTHROPLASTY ANTERIOR APPROACH;  Surgeon: Durene Romans, MD;  Location: [facility];  Service: Orthopedics;  Laterality: Right;  70 mins   TOTAL KNEE ARTHROPLASTY Left 07/16/2014   Procedure: LEFT TOTAL KNEE ARTHROPLASTY;  Surgeon: Durene Romans, MD;  Location: [facility];  Service: Orthopedics;  Laterality: Left;   TOTAL KNEE ARTHROPLASTY Right 06/16/2022   Procedure: TOTAL KNEE ARTHROPLASTY;  Surgeon: Durene Romans, MD;  Location: [facility];  Service: Orthopedics;  Laterality: Right;    Allergies: Norco [hydrocodone-acetaminophen]  Medications: Prior to Admission medications   Medication Sig Start Date End Date Taking? Authorizing Provider  ALPRAZolam Prudy Feeler) 1 MG tablet TAKE ONE TABLET BY MOUTH EVERY 6 HOURS AS NEEDED FOR ANXIETY 09/15/22   Nelwyn Salisbury, MD  amoxicillin-clavulanate (AUGMENTIN) 875-125 MG tablet Take 1 tablet by mouth 2 (two) times daily. 11/13/22   Nelwyn Salisbury, MD  atorvastatin (LIPITOR) 20 MG tablet Take 1 tablet (20 mg total) by mouth daily. 07/07/22   Nelwyn Salisbury, MD  latanoprost (XALATAN) 0.005 % ophthalmic solution SMARTSIG:In Eye(s) 12/08/19   [provider]  lisinopril-hydrochlorothiazide (ZESTORETIC) 10-12.5 MG tablet TAKE ONE TABLET EVERY DAY 09/14/22  Nelwyn Salisbury, MD  methocarbamol (ROBAXIN) 500 MG tablet Take 1 tablet (500 mg total) by mouth every 6 (six) hours as needed for muscle spasms. 06/17/22   Cassandria Anger, PA-C  Multiple Vitamins-Minerals (CENTRUM SILVER 50+MEN) TABS Take 1 tablet by mouth daily.    [provider]  Multiple Vitamins-Minerals (PRESERVISION AREDS PO) Take by mouth.    [provider]  naloxone [facility]) nasal spray 4 mg/0.1 mL Spray into nostril with signs of opioid related oversedation or overdose 06/17/22   Cassandria Anger, PA-C   omeprazole (PRILOSEC) 40 MG capsule TAKE ONE CAPSULE BY MOUTH TWICE DAILY 10/15/22   Nelwyn Salisbury, MD  oxyCODONE (OXY IR/ROXICODONE) 5 MG immediate release tablet Take 0.5-1 tablets (2.5-5 mg total) by mouth every 4 (four) hours as needed for severe pain. 06/17/22   Cassandria Anger, PA-C  polyethylene glycol (MIRALAX / GLYCOLAX) 17 g packet Take 17 g by mouth 2 (two) times daily. 06/17/22   Cassandria Anger, PA-C  Syringe/Needle, Disp, (SYRINGE 3CC/22GX1-1/2") 22G X 1-1/2" 3 ML MISC 1 Application by Does not apply route every 7 (seven) days. 02/18/22   Nelwyn Salisbury, MD  tadalafil (CIALIS) 20 MG tablet Take 1 tablet (20 mg total) by mouth as needed for erectile dysfunction. Patient taking differently: Take 20 mg by mouth daily. 05/01/22   Nelwyn Salisbury, MD  tamsulosin (FLOMAX) 0.4 MG CAPS capsule Take 2 capsules (0.8 mg total) by mouth daily. 07/27/22   Nelwyn Salisbury, MD  temazepam (RESTORIL) 30 MG capsule TAKE ONE CAPSULE BY MOUTH AT BEDTIME AS NEEDED FOR SLEEP 10/19/22   Nelwyn Salisbury, MD  testosterone cypionate (DEPOTESTOSTERONE CYPIONATE) 200 MG/ML injection Inject 1 mL (200 mg total) into the muscle every 7 (seven) days. 09/15/22   Nelwyn Salisbury, MD     Family History  Problem Relation Age of Onset   Alzheimer's disease Other    Colon cancer Neg Hx    Esophageal cancer Neg Hx    Pancreatic cancer Neg Hx    Prostate cancer Neg Hx    Rectal cancer Neg Hx    Stomach cancer Neg Hx     Social History   Socioeconomic History   Marital status: Widowed    Spouse name: Not on file   Number of children: Not on file   Years of education: Not on file   Highest education level: Not on file  Occupational History   Not on file  Tobacco Use   Smoking status: Former    Current packs/day: 0.00    Types: Cigarettes    Quit date: 02/09/1974    Years since quitting: 48.8   Smokeless tobacco: Never   Tobacco comments:    quit over 89 yo, absolutely smoked more than 100 cig; referred for [employer]    Vaping Use   Vaping status: Never Used  Substance and Sexual Activity   Alcohol use: Yes    Alcohol/week: 14.0 standard drinks of alcohol    Types: 14 Shots of liquor per week    Comment: drinks a couple shots daily   Drug use: Yes    Types: Marijuana    Comment: occas uses 1 time every other month - this past weekend   Sexual activity: Not Currently  Other Topics Concern   Not on file  Social History Narrative   Lives with spouse who had progressive disease   24/7 care   2 children   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      Social Determinants of Health   Financial Resource Strain: Low Risk  (01/06/2022)   Overall Financial Resource Strain (CARDIA)    Difficulty of Paying Living Expenses: Not hard at all  Food Insecurity: No Food Insecurity (06/16/2022)   Hunger Vital Sign    Worried About Running Out of Food in the Last Year: Never true    Ran Out of Food in the Last Year: Never true  Transportation Needs: No Transportation Needs (06/16/2022)   PRAPARE - Administrator, Civil Service (Medical): No    Lack of Transportation (Non-Medical): No  Physical Activity: Sufficiently Active (01/06/2022)   Exercise Vital Sign    Days of Exercise per Week: 7 days    Minutes of Exercise per Session: 100 min  Stress: No Stress Concern Present (01/06/2022)   [employer] of Occupational Health - Occupational Stress Questionnaire    Feeling of Stress : Not at all  Social Connections: Socially Isolated (01/06/2022)   Social Connection and Isolation Panel [NHANES]    Frequency of Communication with Friends and Family: More than three times a week    Frequency of Social Gatherings with Friends and Family: More than three times a week    Attends Religious Services: Never    Database administrator or Organizations: No    Attends Banker Meetings: Never    Marital Status: Widowed    Review of Systems: A 12 point ROS discussed and pertinent positives are  indicated in the HPI above.  All other systems are negative.  Vital Signs: There were no vitals taken for this visit.  Advance Care Plan: The advanced care plan/surrogate decision maker was discussed at the time of visit and documented in the medical record.   No physical examination was performed in lieu of virtual telephone clinic visit.  Imaging: MRI Prostate (08/20/22)     Prostate volume 192.16 cc (7.5 by 7.0 by 7.9 cm).   1. Two PI-RADS category 4 lesions are identified in the peripheral zone. Targeting data sent to [facility]. 2. Marked prostatomegaly and benign prostatic hypertrophy. 3. Trace left hip joint effusion.    Labs:  CBC: Recent Labs    02/10/22 1506 06/03/22 1159 06/16/22 0906 06/17/22 0334  WBC 6.6 6.2 5.7 10.0  HGB 14.8 16.7 14.9 14.2  HCT 43.9 50.8 45.3 42.2  PLT 161.0 131* 97* 132*    COAGS: No results for input(s): "INR", "APTT" in the last 8760 hours.  BMP: Recent Labs    02/10/22 1506 06/03/22 1159 06/16/22 0538 06/17/22 0334  NA 134* 137 134* 134*  K 4.1 4.8 3.7 4.0  CL 97 101 101 102  CO2 26 27 24 24   GLUCOSE 118* 111* 144* 129*  BUN 21 28* 19 24*  CALCIUM 9.9 9.3 8.7* 8.1*  CREATININE 1.12 1.74* 1.10 1.26*  GFRNONAA  --  39* >60 57*    LIVER FUNCTION TESTS: No results for input(s): "BILITOT", "AST", "ALT", "ALKPHOS", "PROT", "ALBUMIN" in the last 8760 hours.  TUMOR MARKERS:   Assessment and Plan: 82 year old male with history of benign prostatic hyperplasia (192 g) and severe lower urinary tract symptoms (IPSS-QoL 20/5).  Recent MRI demonstrates 2 PI-RADS 4 lesions that Dr. Alvester Morin and the patient have elected to observe over time.  He would be an excellent candidate for prostate artery embolization.  We discussed the rationale, risks, benefits, and periprocedural expectations regarding prostate artery embolization and he would like to proceed.  Plan for prostate artery embolization with moderate sedation at [facility].  No need for additional pre-procedure imaging.     Electronically Signed: Bennie Dallas, MD 11/18/2022, 11:16 AM   I spent a total of  60 Minutes in virtual telephone clinical consultation, greater than 50% of which was counseling/coordinating care for benign prostatic hyperplasia with lower urina=ar

## 2022-11-19 ENCOUNTER — Other Ambulatory Visit (HOSPITAL_COMMUNITY): Payer: Self-pay | Admitting: Interventional Radiology

## 2022-11-19 DIAGNOSIS — N401 Enlarged prostate with lower urinary tract symptoms: Secondary | ICD-10-CM

## 2022-11-27 DIAGNOSIS — E291 Testicular hypofunction: Secondary | ICD-10-CM | POA: Diagnosis not present

## 2022-11-27 DIAGNOSIS — R948 Abnormal results of function studies of other organs and systems: Secondary | ICD-10-CM | POA: Diagnosis not present

## 2022-11-30 DIAGNOSIS — J40 Bronchitis, not specified as acute or chronic: Secondary | ICD-10-CM | POA: Diagnosis not present

## 2022-11-30 DIAGNOSIS — H6993 Unspecified Eustachian tube disorder, bilateral: Secondary | ICD-10-CM | POA: Diagnosis not present

## 2022-12-01 ENCOUNTER — Ambulatory Visit: Payer: Medicare Other | Admitting: Family Medicine

## 2022-12-02 ENCOUNTER — Telehealth (HOSPITAL_COMMUNITY): Payer: Self-pay | Admitting: Radiology

## 2022-12-02 NOTE — Telephone Encounter (Signed)
Called pt, left VM for him to call me to schedule his PAE with Dr. Elby Showers on 12/21/22. JM

## 2022-12-02 NOTE — Telephone Encounter (Signed)
Pt returned my call. He is seeing his urologist tomorrow and will call me to schedule after that appt. JM

## 2022-12-04 DIAGNOSIS — E291 Testicular hypofunction: Secondary | ICD-10-CM | POA: Diagnosis not present

## 2022-12-04 DIAGNOSIS — N401 Enlarged prostate with lower urinary tract symptoms: Secondary | ICD-10-CM | POA: Diagnosis not present

## 2022-12-04 DIAGNOSIS — R972 Elevated prostate specific antigen [PSA]: Secondary | ICD-10-CM | POA: Diagnosis not present

## 2022-12-04 DIAGNOSIS — R3912 Poor urinary stream: Secondary | ICD-10-CM | POA: Diagnosis not present

## 2022-12-17 DIAGNOSIS — D075 Carcinoma in situ of prostate: Secondary | ICD-10-CM | POA: Diagnosis not present

## 2022-12-17 DIAGNOSIS — R972 Elevated prostate specific antigen [PSA]: Secondary | ICD-10-CM | POA: Diagnosis not present

## 2022-12-25 DIAGNOSIS — N5201 Erectile dysfunction due to arterial insufficiency: Secondary | ICD-10-CM | POA: Diagnosis not present

## 2022-12-25 DIAGNOSIS — R3912 Poor urinary stream: Secondary | ICD-10-CM | POA: Diagnosis not present

## 2022-12-25 DIAGNOSIS — E291 Testicular hypofunction: Secondary | ICD-10-CM | POA: Diagnosis not present

## 2022-12-25 DIAGNOSIS — R972 Elevated prostate specific antigen [PSA]: Secondary | ICD-10-CM | POA: Diagnosis not present

## 2022-12-25 DIAGNOSIS — N401 Enlarged prostate with lower urinary tract symptoms: Secondary | ICD-10-CM | POA: Diagnosis not present

## 2022-12-29 DIAGNOSIS — K080 Exfoliation of teeth due to systemic causes: Secondary | ICD-10-CM | POA: Diagnosis not present

## 2023-01-11 ENCOUNTER — Ambulatory Visit (INDEPENDENT_AMBULATORY_CARE_PROVIDER_SITE_OTHER): Payer: Medicare Other

## 2023-01-11 VITALS — Ht 72.0 in | Wt 218.0 lb

## 2023-01-11 DIAGNOSIS — Z0000 Encounter for general adult medical examination without abnormal findings: Secondary | ICD-10-CM

## 2023-01-11 NOTE — Patient Instructions (Addendum)
Benjamin [name] , Thank you for taking time to come for your Medicare Wellness Visit. I appreciate your ongoing commitment to your health goals. Please review the following plan we discussed and let me know if I can assist you in the future.   Referrals/Orders/Follow-Ups/Clinician Recommendations:   This is a list of the screening recommended for you and due dates:  Health Maintenance  Topic Date Due   Complete foot exam   Never done   Yearly kidney health urinalysis for diabetes  Never done   DTaP/Tdap/Td vaccine (1 - Tdap) Never done   Zoster (Shingles) Vaccine (2 of 2) 03/25/2017   Hemoglobin A1C  12/02/2021   Eye exam for diabetics  05/08/2022   Flu Shot  09/10/2022   COVID-19 Vaccine (5 - 2023-24 season) 10/11/2022   Yearly kidney function blood test for diabetes  06/17/2023   Medicare Annual Wellness Visit  01/11/2024   Pneumonia Vaccine  Completed   HPV Vaccine  Aged Out   Colon Cancer Screening  Discontinued   Opioid Pain Medicine Management Opioids are powerful medicines that are used to treat moderate to severe pain. When used for short periods of time, they can help you to: Sleep better. Do better in physical or occupational therapy. Feel better in the first few days after an injury. Recover from surgery. Opioids should be taken with the supervision of a trained health care provider. They should be taken for the shortest period of time possible. This is because opioids can be addictive, and the longer you take opioids, the greater your risk of addiction. This addiction can also be called opioid use disorder. What are the risks? Using opioid pain medicines for longer than 3 days increases your risk of side effects. Side effects include: Constipation. Nausea and vomiting. Breathing difficulties (respiratory depression). Drowsiness. Confusion. Opioid use disorder. Itching. Taking opioid pain medicine for a long period of time can affect your ability to do daily tasks. It also  puts you at risk for: Motor vehicle crashes. Depression. Suicide. Heart attack. Overdose, which can be life-threatening. What is a pain treatment plan? A pain treatment plan is an agreement between you and your health care provider. Pain is unique to each person, and treatments vary depending on your condition. To manage your pain, you and your health care provider need to work together. To help you do this: Discuss the goals of your treatment, including how much pain you might expect to have and how you will manage the pain. Review the risks and benefits of taking opioid medicines. Remember that a good treatment plan uses more than one approach and minimizes the chance of side effects. Be honest about the amount of medicines you take and about any drug or alcohol use. Get pain medicine prescriptions from only one health care provider. Pain can be managed with many types of alternative treatments. Ask your health care provider to refer you to one or more specialists who can help you manage pain through: Physical or occupational therapy. Counseling (cognitive behavioral therapy). Good nutrition. Biofeedback. Massage. Meditation. Non-opioid medicine. Following a gentle exercise program. How to use opioid pain medicine Taking medicine Take your pain medicine exactly as told by your health care provider. Take it only when you need it. If your pain gets less severe, you may take less than your prescribed dose if your health care provider approves. If you are not having pain, do nottake pain medicine unless your health care provider tells you to take it. If your  pain is severe, do nottry to treat it yourself by taking more pills than instructed on your prescription. Contact your health care provider for help. Write down the times when you take your pain medicine. It is easy to become confused while on pain medicine. Writing the time can help you avoid overdose. Take other over-the-counter or  prescription medicines only as told by your health care provider. Keeping yourself and others safe  While you are taking opioid pain medicine: Do not drive, use machinery, or power tools. Do not sign legal documents. Do not drink alcohol. Do not take sleeping pills. Do not supervise children by yourself. Do not do activities that require climbing or being in high places. Do not go to a lake, river, ocean, spa, or swimming pool. Do not share your pain medicine with anyone. Keep pain medicine in a locked cabinet or in a secure area where pets and children cannot reach it. Stopping your use of opioids If you have been taking opioid medicine for more than a few weeks, you may need to slowly decrease (taper) how much you take until you stop completely. Tapering your use of opioids can decrease your risk of symptoms of withdrawal, such as: Pain and cramping in the abdomen. Nausea. Sweating. Sleepiness. Restlessness. Uncontrollable shaking (tremors). Cravings for the medicine. Do not attempt to taper your use of opioids on your own. Talk with your health care provider about how to do this. Your health care provider may prescribe a step-down schedule based on how much medicine you are taking and how long you have been taking it. Getting rid of leftover pills Do not save any leftover pills. Get rid of leftover pills safely by: Taking the medicine to a prescription take-back program. This is usually offered by the county or law enforcement. Bringing them to a pharmacy that has a drug disposal container. Flushing them down the toilet. Check the label or package insert of your medicine to see whether this is safe to do. Throwing them out in the trash. Check the label or package insert of your medicine to see whether this is safe to do. If it is safe to throw it out, remove the medicine from the original container, put it into a sealable bag or container, and mix it with used coffee grounds, food  scraps, dirt, or cat litter before putting it in the trash. Follow these instructions at home: Activity Do exercises as told by your health care provider. Avoid activities that make your pain worse. Return to your normal activities as told by your health care provider. Ask your health care provider what activities are safe for you. General instructions You may need to take these actions to prevent or treat constipation: Drink enough fluid to keep your urine pale yellow. Take over-the-counter or prescription medicines. Eat foods that are high in fiber, such as beans, whole grains, and fresh fruits and vegetables. Limit foods that are high in fat and processed sugars, such as fried or sweet foods. Keep all follow-up visits. This is important. Where to find support If you have been taking opioids for a long time, you may benefit from receiving support for quitting from a local support group or counselor. Ask your health care provider for a referral to these resources in your area. Where to find more information Centers for Disease Control and Prevention (CDC): FootballExhibition.com.br [place] Food and Drug Administration (FDA): PumpkinSearch.com.ee Get help right away if: You may have taken too much of an opioid (overdosed). Common  symptoms of an overdose: Your breathing is slower or more shallow than normal. You have a very slow heartbeat (pulse). You have slurred speech. You have nausea and vomiting. Your pupils become very small. You have other potential symptoms: You are very confused. You faint or feel like you will faint. You have cold, clammy skin. You have blue lips or fingernails. You have thoughts of harming yourself or harming others. These symptoms may represent a serious problem that is an emergency. Do not wait to see if the symptoms will go away. Get medical help right away. Call your local emergency services (911 in the [place] Do not drive yourself to the hospital.  If you ever feel like you may  hurt yourself or others, or have thoughts about taking your own life, get help right away. Go to your nearest emergency department or: Call your local emergency services (911 in the [place] Call the [facility] (5876370588 in the [place] Call a suicide crisis helpline, such as the National Suicide Prevention Lifeline at 704-575-4830 or 988 in the [place] This is open 24 hours a day in the [place] Text the Crisis Text Line at (316)170-4088 (in the [place] Summary Opioid medicines can help you manage moderate to severe pain for a short period of time. A pain treatment plan is an agreement between you and your health care provider. Discuss the goals of your treatment, including how much pain you might expect to have and how you will manage the pain. If you think that you or someone else may have taken too much of an opioid, get medical help right away. This information is not intended to replace advice given to you by your health care provider. Make sure you discuss any questions you have with your health care provider. Document Revised: 08/21/2020 Document Reviewed: 05/08/2020 [employer] Patient Education  2024 [employer]  Advanced directives: (In Chart) A copy of your advanced directives are scanned into your chart should your provider ever need it.  Next Medicare Annual Wellness Visit scheduled for next year: Yes

## 2023-01-11 NOTE — Progress Notes (Cosign Needed Addendum)
Subjective:   Benjamin [name] is a 82 y.o. male who presents for Medicare Annual/Subsequent preventive examination.  Visit Complete: Virtual I connected with  Alanson Puls on 01/11/23 by a audio enabled telemedicine application and verified that I am speaking with the correct person using two identifiers.  Patient Location: Home  Provider Location: Home Office  I discussed the limitations of evaluation and management by telemedicine. The patient expressed understanding and agreed to proceed.  Vital Signs: Because this visit was a virtual/telehealth visit, some criteria may be missing or patient reported. Any vitals not documented were not able to be obtained and vitals that have been documented are patient reported.  Patient Medicare AWV questionnaire was completed by the patient on 01/09/23; I have confirmed that all information answered by patient is correct and no changes since this date.  Cardiac Risk Factors include: advanced age (>92men, >15 women);male gender;diabetes mellitus     Objective:    Today's Vitals   01/11/23 1348  Weight: 218 lb (98.9 kg)  Height: 6' (1.829 m)   Body mass index is 29.57 kg/m.     01/11/2023    1:56 PM 06/16/2022    6:00 AM 06/03/2022   11:42 AM 01/06/2022   11:39 AM 12/31/2020   10:54 AM 12/08/2019    3:44 PM 01/05/2017    1:00 PM  Advanced Directives  Does Patient Have a Medical Advance Directive? Yes Yes Yes Yes Yes Yes Yes  Type of Estate agent of [place];Living will Living will;Healthcare Power of [employer] Power of [place];Living will Healthcare Power of [place];Living will Healthcare Power of [place];Living will Living will;Healthcare Power of Attorney  Does patient want to make changes to medical advance directive? No - Patient declined No - Patient declined No - Patient declined   No - Patient declined No - Patient declined  Copy of Healthcare Power of Attorney in Chart? Yes - validated  most recent copy scanned in chart (See row information) No - copy requested  No - copy requested No - copy requested Yes - validated most recent copy scanned in chart (See row information) No - copy requested    Current Medications (verified) Outpatient Encounter Medications as of 01/11/2023  Medication Sig   ALPRAZolam (XANAX) 1 MG tablet TAKE ONE TABLET BY MOUTH EVERY 6 HOURS AS NEEDED FOR ANXIETY   atorvastatin (LIPITOR) 20 MG tablet Take 1 tablet (20 mg total) by mouth daily.   latanoprost (XALATAN) 0.005 % ophthalmic solution SMARTSIG:In Eye(s)   lisinopril-hydrochlorothiazide (ZESTORETIC) 10-12.5 MG tablet TAKE ONE TABLET EVERY DAY   omeprazole (PRILOSEC) 40 MG capsule TAKE ONE CAPSULE BY MOUTH TWICE DAILY   Syringe/Needle, Disp, (SYRINGE 3CC/22GX1-1/2") 22G X 1-1/2" 3 ML MISC 1 Application by Does not apply route every 7 (seven) days.   tadalafil (CIALIS) 20 MG tablet Take 1 tablet (20 mg total) by mouth as needed for erectile dysfunction. (Patient taking differently: Take 20 mg by mouth daily.)   tamsulosin (FLOMAX) 0.4 MG CAPS capsule Take 2 capsules (0.8 mg total) by mouth daily.   temazepam (RESTORIL) 30 MG capsule TAKE ONE CAPSULE BY MOUTH AT BEDTIME AS NEEDED FOR SLEEP   testosterone cypionate (DEPOTESTOSTERONE CYPIONATE) 200 MG/ML injection Inject 1 mL (200 mg total) into the muscle every 7 (seven) days.   [DISCONTINUED] amoxicillin-clavulanate (AUGMENTIN) 875-125 MG tablet Take 1 tablet by mouth 2 (two) times daily.   [DISCONTINUED] methocarbamol (ROBAXIN) 500 MG tablet Take 1 tablet (500 mg total) by mouth every  6 (six) hours as needed for muscle spasms.   [DISCONTINUED] Multiple Vitamins-Minerals (CENTRUM SILVER 50+MEN) TABS Take 1 tablet by mouth daily.   [DISCONTINUED] Multiple Vitamins-Minerals (PRESERVISION AREDS PO) Take by mouth.   [DISCONTINUED] naloxone (NARCAN) nasal spray 4 mg/0.1 mL Spray into nostril with signs of opioid related oversedation or overdose    [DISCONTINUED] oxyCODONE (OXY IR/ROXICODONE) 5 MG immediate release tablet Take 0.5-1 tablets (2.5-5 mg total) by mouth every 4 (four) hours as needed for severe pain.   [DISCONTINUED] polyethylene glycol (MIRALAX / GLYCOLAX) 17 g packet Take 17 g by mouth 2 (two) times daily.   No facility-administered encounter medications on file as of 01/11/2023.    Allergies (verified) Norco [hydrocodone-acetaminophen]   History: Past Medical History:  Diagnosis Date   Allergy    Anxiety    Arthritis    BPH (benign prostatic hyperplasia)    with elevated PSA, sees Dr. Retta Diones, benign   Cataract    Elevated PSA    sees Dr. Retta Diones    GERD (gastroesophageal reflux disease)    Hyperlipidemia    Hypertension    Insomnia    Platelets decreased (HCC)    Pneumonia    walking PNA   Pre-diabetes    No meds   Past Surgical History:  Procedure Laterality Date   CHOLECYSTECTOMY N/A 01/10/2014   Procedure: LAPAROSCOPIC CHOLECYSTECTOMY WITH INTRAOPERATIVE CHOLANGIOGRAM;  Surgeon: Valarie Merino, MD;  Location: [facility];  Service: General;  Laterality: N/A;   COLONOSCOPY  08/03/2018   per Dr. Leone Payor, adenomatous polyps, no repeats needed    IR RADIOLOGIST EVAL & MGMT  11/18/2022   JOINT REPLACEMENT     Right hip arthroplasty Dr. Charlann Boxer 01/05/17    left knee arthroscopy      LIPOSUCTION  1987   prostate biopsies in 2003 and again 05/2008     per Dr. Retta Diones. benign   REFRACTIVE SURGERY Bilateral    cataract surgery   repair  of large umblicial hernia     per Dr. Wenda Low on 01/10/10   right knee arthroscopy     TONSILLECTOMY     TOTAL HIP ARTHROPLASTY Right 01/05/2017   Procedure: RIGHT TOTAL HIP ARTHROPLASTY ANTERIOR APPROACH;  Surgeon: Durene Romans, MD;  Location: [facility];  Service: Orthopedics;  Laterality: Right;  70 mins   TOTAL KNEE ARTHROPLASTY Left 07/16/2014   Procedure: LEFT TOTAL KNEE ARTHROPLASTY;  Surgeon: Durene Romans, MD;  Location: [facility];  Service: Orthopedics;   Laterality: Left;   TOTAL KNEE ARTHROPLASTY Right 06/16/2022   Procedure: TOTAL KNEE ARTHROPLASTY;  Surgeon: Durene Romans, MD;  Location: [facility];  Service: Orthopedics;  Laterality: Right;   Family History  Problem Relation Age of Onset   Alzheimer's disease Other    Colon cancer Neg Hx    Esophageal cancer Neg Hx    Pancreatic cancer Neg Hx    Prostate cancer Neg Hx    Rectal cancer Neg Hx    Stomach cancer Neg Hx    Social History   Socioeconomic History   Marital status: Widowed    Spouse name: Not on file   Number of children: Not on file   Years of education: Not on file   Highest education level: Not on file  Occupational History   Not on file  Tobacco Use   Smoking status: Former    Current packs/day: 0.00    Types: Cigarettes    Quit date: 02/09/1974    Years since quitting: 48.9  Smokeless tobacco: Never   Tobacco comments:    quit over 48 yo, absolutely smoked more than 100 cig; referred for AAA   Vaping Use   Vaping status: Never Used  Substance and Sexual Activity   Alcohol use: Yes    Alcohol/week: 14.0 standard drinks of alcohol    Types: 14 Shots of liquor per week    Comment: drinks a couple shots daily   Drug use: Yes    Types: Marijuana    Comment: occas uses 1 time every other month - this past weekend   Sexual activity: Not Currently  Other Topics Concern   Not on file  Social History Narrative   Lives with spouse who had progressive disease   24/7 care   2 children   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      Social Determinants of Health   Financial Resource Strain: Low Risk  (01/09/2023)   Overall Financial Resource Strain (CARDIA)    Difficulty of Paying Living Expenses: Not hard at all  Food Insecurity: No Food Insecurity (01/09/2023)   Hunger Vital Sign    Worried About Running Out of Food in the Last Year: Never true    Ran Out of Food in the Last Year: Never true  Transportation Needs: No Transportation Needs (01/09/2023)   PRAPARE  - Administrator, Civil Service (Medical): No    Lack of Transportation (Non-Medical): No  Physical Activity: Insufficiently Active (01/09/2023)   Exercise Vital Sign    Days of Exercise per Week: 3 days    Minutes of Exercise per Session: 30 min  Stress: No Stress Concern Present (01/09/2023)   [employer] of Occupational Health - Occupational Stress Questionnaire    Feeling of Stress : Only a little  Social Connections: Unknown (01/09/2023)   Social Connection and Isolation Panel [NHANES]    Frequency of Communication with Friends and Family: More than three times a week    Frequency of Social Gatherings with Friends and Family: More than three times a week    Attends Religious Services: Not on file    Active Member of Clubs or Organizations: No    Attends Banker Meetings: Never    Marital Status: Widowed    Tobacco Counseling Counseling given: Not Answered Tobacco comments: quit over 38 yo, absolutely smoked more than 100 cig; referred for AAA    Clinical Intake:  Pre-visit preparation completed: Yes  Pain : No/denies pain     BMI - recorded: 29.57 Nutritional Status: BMI 25 -29 Overweight Nutritional Risks: None Diabetes: Yes CBG done?: No Did pt. bring in CBG monitor from home?: No  How often do you need to have someone help you when you read instructions, pamphlets, or other written materials from your doctor or pharmacy?: 1 - Never  Interpreter Needed?: No  Information entered by :: Theresa Mulligan LPN   Activities of Daily Living    01/09/2023   12:22 PM 06/16/2022    1:00 PM  In your present state of health, do you have any difficulty performing the following activities:  Hearing? 1 1  Comment Wears hearing aids   Vision? 0 0  Difficulty concentrating or making decisions? 0 0  Walking or climbing stairs? 0 1  Dressing or bathing? 0 0  Doing errands, shopping? 0 0  Preparing Food and eating ? N   Using the Toilet? N    In the past six months, have you accidently leaked urine? N  Do you have problems with loss of bowel control? N   Managing your Medications? N   Managing your Finances? N   Housekeeping or managing your Housekeeping? N     Patient Care Team: Nelwyn Salisbury, MD as PCP - General  Indicate any recent Medical Services you may have received from other than [facility] providers in the past year (date may be approximate).     Assessment:   This is a routine wellness examination for [place].  Hearing/Vision screen Hearing Screening - Comments:: Wears hearing aids Vision Screening - Comments:: Wears reading glasses - up to date with routine eye exams with  [facility]   Goals Addressed               This Visit's Progress     Complete personal projects (pt-stated)        Sell house and travel.       Depression Screen    01/11/2023    1:58 PM 01/06/2022   11:35 AM 06/02/2021   11:05 AM 03/28/2021   10:03 AM 12/31/2020   10:55 AM 05/29/2020   10:59 AM 12/08/2019    3:47 PM  PHQ 2/9 Scores  PHQ - 2 Score 0 0 2 1 0 0 1  PHQ- 9 Score   4 1  1 2     Fall Risk    01/11/2023    2:02 PM 01/09/2023   12:22 PM 07/27/2022   11:16 AM 01/06/2022   11:38 AM 12/31/2021   11:55 AM  Fall Risk   Falls in the past year? 0 0 0 0 0  Number falls in past yr: 0 0 0 0   Injury with Fall? 0 0 0 0   Risk for fall due to : No Fall Risks  No Fall Risks No Fall Risks   Follow up Falls prevention discussed  Falls evaluation completed Falls prevention discussed     MEDICARE RISK AT HOME: Medicare Risk at Home Any stairs in or around the home?: Yes If so, are there any without handrails?: No Home free of loose throw rugs in walkways, pet beds, electrical cords, etc?: No Adequate lighting in your home to reduce risk of falls?: Yes Life alert?: No Use of a cane, walker or w/c?: No Grab bars in the bathroom?: Yes Shower chair or bench in shower?: Yes Elevated toilet seat or a handicapped  toilet?: No  TIMED UP AND GO:  Was the test performed?  No    Cognitive Function:        01/11/2023    2:02 PM 01/06/2022   11:40 AM  6CIT Screen  What Year? 0 points 0 points  What month? 0 points 0 points  What time? 0 points 0 points  Count back from 20 0 points 0 points  Months in reverse 0 points 0 points  Repeat phrase 0 points 0 points  Total Score 0 points 0 points    Immunizations Immunization History  Administered Date(s) Administered   Fluad Quad(high Dose 65+) 10/11/2018   Influenza Whole 03/04/2009   Influenza, High Dose Seasonal PF 12/30/2016, 11/12/2017, 12/13/2020   Influenza,inj,Quad PF,6+ Mos 11/06/2013   Influenza-Unspecified 11/19/2014, 11/10/2015, 11/10/2015   PFIZER(Purple Top)SARS-COV-2 Vaccination 02/23/2019, 03/16/2019, 11/06/2019, 12/13/2020   PPD Test 07/18/2014   Pfizer Covid-19 Vaccine Bivalent Booster 47yrs & up 12/13/2020   Pneumococcal Conjugate-13 11/06/2013   Pneumococcal Polysaccharide-23 03/04/2009   Zoster Recombinant(Shingrix) 01/28/2017    TDAP status: Due, Education has been provided regarding the  importance of this vaccine. Advised may receive this vaccine at local pharmacy or Health Dept. Aware to provide a copy of the vaccination record if obtained from local pharmacy or Health Dept. Verbalized acceptance and understanding.  Flu Vaccine status: Due, Education has been provided regarding the importance of this vaccine. Advised may receive this vaccine at local pharmacy or Health Dept. Aware to provide a copy of the vaccination record if obtained from local pharmacy or Health Dept. Verbalized acceptance and understanding.  Pneumococcal vaccine status: Up to date  Covid-19 vaccine status: Declined, Education has been provided regarding the importance of this vaccine but patient still declined. Advised may receive this vaccine at local pharmacy or Health Dept.or vaccine clinic. Aware to provide a copy of the vaccination record if  obtained from local pharmacy or Health Dept. Verbalized acceptance and understanding.  Qualifies for Shingles Vaccine? Yes   Zostavax completed No   Shingrix Completed?: No.    Education has been provided regarding the importance of this vaccine. Patient has been advised to call insurance company to determine out of pocket expense if they have not yet received this vaccine. Advised may also receive vaccine at local pharmacy or Health Dept. Verbalized acceptance and understanding.  Screening Tests Health Maintenance  Topic Date Due   FOOT EXAM  Never done   Diabetic kidney evaluation - Urine ACR  Never done   DTaP/Tdap/Td (1 - Tdap) Never done   Zoster Vaccines- Shingrix (2 of 2) 03/25/2017   HEMOGLOBIN A1C  12/02/2021   OPHTHALMOLOGY EXAM  05/08/2022   INFLUENZA VACCINE  09/10/2022   COVID-19 Vaccine (5 - 2023-24 season) 10/11/2022   Diabetic kidney evaluation - eGFR measurement  06/17/2023   Medicare Annual Wellness (AWV)  01/11/2024   Pneumonia Vaccine 80+ Years old  Completed   HPV VACCINES  Aged Out   Colonoscopy  Discontinued    Health Maintenance  Health Maintenance Due  Topic Date Due   FOOT EXAM  Never done   Diabetic kidney evaluation - Urine ACR  Never done   DTaP/Tdap/Td (1 - Tdap) Never done   Zoster Vaccines- Shingrix (2 of 2) 03/25/2017   HEMOGLOBIN A1C  12/02/2021   OPHTHALMOLOGY EXAM  05/08/2022   INFLUENZA VACCINE  09/10/2022   COVID-19 Vaccine (5 - 2023-24 season) 10/11/2022        Additional Screening:    Vision Screening: Recommended annual ophthalmology exams for early detection of glaucoma and other disorders of the eye. Is the patient up to date with their annual eye exam?  Yes  Who is the provider or what is the name of the office in which the patient attends annual eye exams? [facility] If pt is not established with a provider, would they like to be referred to a provider to establish care? No .   Dental Screening: Recommended annual  dental exams for proper oral hygiene  Diabetic Foot Exam: Diabetic Foot Exam: Overdue, Pt has been advised about the importance in completing this exam. Pt is scheduled for diabetic foot exam on Deferred.  Community Resource Referral / Chronic Care Management: CRR required this visit?  No   CCM required this visit?  No     Plan:     I have personally reviewed and noted the following in the patient's chart:   Medical and social history Use of alcohol, tobacco or illicit drugs  Current medications and supplements including opioid prescriptions. Patient is currently taking opioid prescriptions. Information provided to patient regarding non-opioid  alternatives. Patient advised to discuss non-opioid treatment plan with their provider. Functional ability and status Nutritional status Physical activity Advanced directives List of other physicians Hospitalizations, surgeries, and ER visits in previous 12 months Vitals Screenings to include cognitive, depression, and falls Referrals and appointments  In addition, I have reviewed and discussed with patient certain preventive protocols, quality metrics, and best practice recommendations. A written personalized care plan for preventive services as well as general preventive health recommendations were provided to patient.     Tillie Rung, LPN   16/02/958   After Visit Summary: (MyChart) Due to this being a telephonic visit, the after visit summary with patients personalized plan was offered to patient via MyChart   Nurse Notes: None

## 2023-01-12 ENCOUNTER — Other Ambulatory Visit: Payer: Self-pay | Admitting: Student

## 2023-01-12 DIAGNOSIS — Z01818 Encounter for other preprocedural examination: Secondary | ICD-10-CM

## 2023-01-13 ENCOUNTER — Other Ambulatory Visit: Payer: Self-pay

## 2023-01-13 ENCOUNTER — Other Ambulatory Visit: Payer: Self-pay | Admitting: Radiology

## 2023-01-13 ENCOUNTER — Other Ambulatory Visit (HOSPITAL_COMMUNITY): Payer: Self-pay | Admitting: Interventional Radiology

## 2023-01-13 ENCOUNTER — Ambulatory Visit (HOSPITAL_COMMUNITY)
Admission: RE | Admit: 2023-01-13 | Discharge: 2023-01-13 | Disposition: A | Discharge status: Home / Self Care | Payer: Medicare Other | Source: Ambulatory Visit | Attending: Interventional Radiology | Admitting: Interventional Radiology

## 2023-01-13 DIAGNOSIS — Z01818 Encounter for other preprocedural examination: Secondary | ICD-10-CM | POA: Insufficient documentation

## 2023-01-13 DIAGNOSIS — N401 Enlarged prostate with lower urinary tract symptoms: Secondary | ICD-10-CM | POA: Diagnosis not present

## 2023-01-13 DIAGNOSIS — N138 Other obstructive and reflux uropathy: Secondary | ICD-10-CM

## 2023-01-13 DIAGNOSIS — Z79899 Other long term (current) drug therapy: Secondary | ICD-10-CM | POA: Diagnosis not present

## 2023-01-13 DIAGNOSIS — Z87891 Personal history of nicotine dependence: Secondary | ICD-10-CM | POA: Insufficient documentation

## 2023-01-13 HISTORY — PX: IR ANGIOGRAM PELVIS SELECTIVE OR SUPRASELECTIVE: IMG661

## 2023-01-13 HISTORY — PX: IR EMBO TUMOR ORGAN ISCHEMIA INFARCT INC GUIDE ROADMAPPING: IMG5449

## 2023-01-13 HISTORY — PX: IR US GUIDE VASC ACCESS RIGHT: IMG2390

## 2023-01-13 HISTORY — PX: IR US GUIDE VASC ACCESS LEFT: IMG2389

## 2023-01-13 HISTORY — PX: IR ANGIOGRAM SELECTIVE EACH ADDITIONAL VESSEL: IMG667

## 2023-01-13 LAB — CBC
HCT: 45.7 % (ref 39.0–52.0)
Hemoglobin: 15.6 g/dL (ref 13.0–17.0)
MCH: 32.1 pg (ref 26.0–34.0)
MCHC: 34.1 g/dL (ref 30.0–36.0)
MCV: 94 fL (ref 80.0–100.0)
Platelets: 121 10*3/uL — ABNORMAL LOW (ref 150–400)
RBC: 4.86 MIL/uL (ref 4.22–5.81)
RDW: 13.7 % (ref 11.5–15.5)
WBC: 4 10*3/uL (ref 4.0–10.5)
nRBC: 0 % (ref 0.0–0.2)

## 2023-01-13 LAB — BASIC METABOLIC PANEL
Anion gap: 7 (ref 5–15)
BUN: 15 mg/dL (ref 8–23)
CO2: 24 mmol/L (ref 22–32)
Calcium: 8.8 mg/dL — ABNORMAL LOW (ref 8.9–10.3)
Chloride: 106 mmol/L (ref 98–111)
Creatinine, Ser: 1.19 mg/dL (ref 0.61–1.24)
GFR, Estimated: >60 mL/min (ref >60)
Glucose, Bld: 124 mg/dL — ABNORMAL HIGH (ref 70–99)
Potassium: 3.8 mmol/L (ref 3.5–5.1)
Sodium: 137 mmol/L (ref 135–145)

## 2023-01-13 LAB — GLUCOSE, CAPILLARY: Glucose-Capillary: 106 mg/dL — ABNORMAL HIGH (ref 70–99)

## 2023-01-13 LAB — PROTIME-INR
INR: 1 (ref 0.8–1.2)
Prothrombin Time: 13.6 s (ref 11.4–15.2)

## 2023-01-13 MED ORDER — METHYLPREDNISOLONE 4 MG PO TBPK: ORAL_TABLET | ORAL | 0 refills | Status: DC

## 2023-01-13 MED ORDER — LIDOCAINE HCL URETHRAL/MUCOSAL 2 % EX GEL: 1.0000 | Freq: Once | CUTANEOUS | Status: DC

## 2023-01-13 MED ORDER — MIDAZOLAM HCL 2 MG/2ML IJ SOLN
INTRAMUSCULAR | Status: AC
Start: 2023-01-13 — End: ?
  Filled 2023-01-13: qty 2

## 2023-01-13 MED ORDER — MIDAZOLAM HCL 2 MG/2ML IJ SOLN
INTRAMUSCULAR | Status: AC | PRN
Start: 2023-01-13 — End: 2023-01-13
  Administered 2023-01-13 (×2): 1 mg via INTRAVENOUS
  Administered 2023-01-13: .5 mg via INTRAVENOUS
  Administered 2023-01-13: 1 mg via INTRAVENOUS
  Administered 2023-01-13 (×2): .5 mg via INTRAVENOUS
  Administered 2023-01-13: 1 mg via INTRAVENOUS
  Administered 2023-01-13: .5 mg via INTRAVENOUS

## 2023-01-13 MED ORDER — FENTANYL CITRATE (PF) 100 MCG/2ML IJ SOLN
INTRAMUSCULAR | Status: AC | PRN
  Administered 2023-01-13 (×3): 25 ug via INTRAVENOUS
  Administered 2023-01-13: 50 ug via INTRAVENOUS
  Administered 2023-01-13 (×2): 25 ug via INTRAVENOUS
  Administered 2023-01-13 (×2): 50 ug via INTRAVENOUS
  Administered 2023-01-13: 25 ug via INTRAVENOUS

## 2023-01-13 MED ORDER — NITROGLYCERIN 1 MG/10 ML FOR IR/CATH LAB
INTRA_ARTERIAL | Status: AC
  Filled 2023-01-13: qty 10

## 2023-01-13 MED ORDER — FENTANYL CITRATE (PF) 100 MCG/2ML IJ SOLN
INTRAMUSCULAR | Status: AC
  Filled 2023-01-13: qty 2

## 2023-01-13 MED ORDER — PHENAZOPYRIDINE HCL 100 MG PO TABS: 100.0000 mg | ORAL_TABLET | Freq: Three times a day (TID) | ORAL | 0 refills | Status: DC

## 2023-01-13 MED ORDER — LIDOCAINE-EPINEPHRINE 1 %-1:100000 IJ SOLN
20.0000 mL | Freq: Once | INTRAMUSCULAR | Status: AC
  Administered 2023-01-13: 10 mL

## 2023-01-13 MED ORDER — CIPROFLOXACIN HCL 500 MG PO TABS: 500.0000 mg | ORAL_TABLET | Freq: Two times a day (BID) | ORAL | 0 refills | Status: DC

## 2023-01-13 MED ORDER — FENTANYL CITRATE (PF) 100 MCG/2ML IJ SOLN
INTRAMUSCULAR | Status: AC
  Filled 2023-01-13: qty 4

## 2023-01-13 MED ORDER — LIDOCAINE HCL URETHRAL/MUCOSAL 2 % EX GEL
1.0000 | Freq: Once | CUTANEOUS | Status: DC
  Filled 2023-01-13: qty 5
  Filled 2023-01-13 (×2): qty 6
  Filled 2023-01-13: qty 5

## 2023-01-13 MED ORDER — CIPROFLOXACIN IN D5W 400 MG/200ML IV SOLN
400.0000 mg | Freq: Once | INTRAVENOUS | Status: AC
  Administered 2023-01-13: 400 mg via INTRAVENOUS

## 2023-01-13 MED ORDER — PREDNISONE 20 MG PO TABS
20.0000 mg | ORAL_TABLET | Freq: Once | ORAL | Status: AC
  Administered 2023-01-13: 20 mg via ORAL
  Filled 2023-01-13 (×2): qty 1

## 2023-01-13 MED ORDER — IODIXANOL 320 MG/ML IV SOLN
100.0000 mL | Freq: Once | INTRAVENOUS | Status: AC | PRN
  Administered 2023-01-13: 25 mL via INTRAVENOUS

## 2023-01-13 MED ORDER — MIDAZOLAM HCL 2 MG/2ML IJ SOLN
INTRAMUSCULAR | Status: AC
  Filled 2023-01-13: qty 4

## 2023-01-13 MED ORDER — SOLIFENACIN SUCCINATE 5 MG PO TABS: 5.0000 mg | ORAL_TABLET | Freq: Every day | ORAL | 0 refills | Status: DC

## 2023-01-13 MED ORDER — CIPROFLOXACIN IN D5W 400 MG/200ML IV SOLN
500.0000 mg | INTRAVENOUS | Status: DC
  Filled 2023-01-13: qty 400

## 2023-01-13 MED ORDER — IODIXANOL 320 MG/ML IV SOLN
100.0000 mL | Freq: Once | INTRAVENOUS | Status: AC | PRN
  Administered 2023-01-13: 50 mL via INTRAVENOUS

## 2023-01-13 MED ORDER — KETOROLAC TROMETHAMINE 30 MG/ML IJ SOLN
30.0000 mg | Freq: Once | INTRAMUSCULAR | Status: AC
  Administered 2023-01-13: 30 mg via INTRAMUSCULAR
  Filled 2023-01-13: qty 1

## 2023-01-13 MED ORDER — LIDOCAINE-EPINEPHRINE 1 %-1:100000 IJ SOLN
INTRAMUSCULAR | Status: AC
Start: 2023-01-13 — End: ?
  Filled 2023-01-13: qty 1

## 2023-01-13 MED ORDER — IODIXANOL 320 MG/ML IV SOLN
100.0000 mL | Freq: Once | INTRAVENOUS | Status: AC | PRN
Start: 2023-01-13 — End: 2023-01-13
  Administered 2023-01-13: 25 mL via INTRAVENOUS

## 2023-01-13 MED ORDER — SODIUM CHLORIDE (PF) 0.9 % IJ SOLN
INTRAVENOUS | Status: AC | PRN
  Administered 2023-01-13: 100 ug via INTRA_ARTERIAL

## 2023-01-13 MED ORDER — LIDOCAINE HCL URETHRAL/MUCOSAL 2 % EX GEL: 1.0000 | Freq: Once | CUTANEOUS | Status: AC

## 2023-01-13 MED ORDER — NITROGLYCERIN 1 MG/10 ML FOR IR/CATH LAB: 10.0000 mL | Freq: Once | INTRA_ARTERIAL | Status: DC

## 2023-01-13 NOTE — Progress Notes (Signed)
Coude cath place. Patient discharged home per orders.

## 2023-01-13 NOTE — Progress Notes (Addendum)
Pt has been up and ambulated to BR.  (R) groin site unremark.  Pt has attempted to void but is unable to.  Pt has had 480 cc PO. Pt given a cup of coffee to drink and Dr Elby Showers paged.

## 2023-01-13 NOTE — Progress Notes (Signed)
Pt still unable to void. Radiology nurse,Mark,RN notified of situation.  Dr Deanne Coffer paged for further instructions.  Report given to [facility] who will assume care at this time.  Pt and girlfriend kept informed

## 2023-01-13 NOTE — Progress Notes (Signed)
Foley cath removed without any difficulty.  Pt tolerated well

## 2023-01-13 NOTE — Procedures (Signed)
Interventional Radiology Procedure Note  Procedure: Left prostatic artery embolization   Findings: Please refer to procedural dictation for full description. Left dominant prostate.  Right prostatic artery atretic.  Right CFA access, 6 Fr Angioseal closure.  Complications: None immediate  Estimated Blood Loss: < 5 mL  Recommendations: Strict 1 hour flat, 1 hour head of bed up to 30 degrees for total of 2 hour bedrest. Remove Foley. IR will arrange 1 month outpatient follow up.   Marliss Coots, MD

## 2023-01-13 NOTE — Progress Notes (Signed)
Dr Eustaquio Maize returned page. Pt to go home with urinary foley bag and will receive a call for an appointment from Dr Jerrye Noble Office to have it removed. Orders placed for coude cath/ team called and supplies obtained. Pt was informed, pt wants to try one more time to void but will be agreeable to having foley reinserted.

## 2023-01-13 NOTE — H&P (Signed)
Chief Complaint: Patient was seen in consultation today for Prostate artery embolization at the request of Dr Langston Masker  Supervising Physician: Marliss Coots  Patient Status: [facility] - Out-pt  History of Present Illness: Benjamin [name] is a 82 y.o. male   FULL Code status per pt Was seen in consultation with Dr Elby Showers 11/18/22 (televisit)  History of chronic benign prostatic hyperplasia with lower urinary tract symptoms and chronic mild elevation of prostate specific antigen since at least 2009.  He underwent prostate biopsy in 2010 which was negative.  He is now followed by Dr. Alvester Morin, and recent MRI prostate (August 20, 2022) demonstrated a 192 g gland with 2, PI-RADS 4 lesions.  Dr. Alvester Morin and the patient have elected to observe these lesions as his most recent PSA (13.8) is concordant with degree of prostatomegaly.  He is hopeful to stop taking tamsulosin, as he is suspicious that his flomax is preventing ejaculation/orgasm.  Assessment and Plan: 82 year old male with history of benign prostatic hyperplasia (192 g) and severe lower urinary tract symptoms (IPSS-QoL 20/5).  Recent MRI demonstrates 2 PI-RADS 4 lesions that Dr. Alvester Morin and the patient have elected to observe over time.  He would be an excellent candidate for prostate artery embolization.  We discussed the rationale, risks, benefits, and periprocedural expectations regarding prostate artery embolization and he would like to proceed.     Plan for prostate artery embolization with moderate sedation at [facility].  No need for additional pre-procedure imaging.     Scheduled now for Prostate artery embolization in IR with Dr Elby Showers  Past Medical History:  Diagnosis Date   Allergy    Anxiety    Arthritis    BPH (benign prostatic hyperplasia)    with elevated PSA, sees Dr. Retta Diones, benign   Cataract    Elevated PSA    sees Dr. Retta Diones    GERD (gastroesophageal reflux disease)    Hyperlipidemia    Hypertension     Insomnia    Platelets decreased (HCC)    Pneumonia    walking PNA   Pre-diabetes    No meds    Past Surgical History:  Procedure Laterality Date   CHOLECYSTECTOMY N/A 01/10/2014   Procedure: LAPAROSCOPIC CHOLECYSTECTOMY WITH INTRAOPERATIVE CHOLANGIOGRAM;  Surgeon: Valarie Merino, MD;  Location: [facility];  Service: General;  Laterality: N/A;   COLONOSCOPY  08/03/2018   per Dr. Leone Payor, adenomatous polyps, no repeats needed    IR RADIOLOGIST EVAL & MGMT  11/18/2022   JOINT REPLACEMENT     Right hip arthroplasty Dr. Charlann Boxer 01/05/17    left knee arthroscopy      LIPOSUCTION  1987   prostate biopsies in 2003 and again 05/2008     per Dr. Retta Diones. benign   REFRACTIVE SURGERY Bilateral    cataract surgery   repair  of large umblicial hernia     per Dr. Wenda Low on 01/10/10   right knee arthroscopy     TONSILLECTOMY     TOTAL HIP ARTHROPLASTY Right 01/05/2017   Procedure: RIGHT TOTAL HIP ARTHROPLASTY ANTERIOR APPROACH;  Surgeon: Durene Romans, MD;  Location: [facility];  Service: Orthopedics;  Laterality: Right;  70 mins   TOTAL KNEE ARTHROPLASTY Left 07/16/2014   Procedure: LEFT TOTAL KNEE ARTHROPLASTY;  Surgeon: Durene Romans, MD;  Location: [facility];  Service: Orthopedics;  Laterality: Left;   TOTAL KNEE ARTHROPLASTY Right 06/16/2022   Procedure: TOTAL KNEE ARTHROPLASTY;  Surgeon: Durene Romans, MD;  Location: [facility];  Service: Orthopedics;  Laterality: Right;    Allergies: Norco [hydrocodone-acetaminophen]  Medications: Prior to Admission medications   Medication Sig Start Date End Date Taking? Authorizing Provider  ALPRAZolam Prudy Feeler) 1 MG tablet TAKE ONE TABLET BY MOUTH EVERY 6 HOURS AS NEEDED FOR ANXIETY 09/15/22   Nelwyn Salisbury, MD  atorvastatin (LIPITOR) 20 MG tablet Take 1 tablet (20 mg total) by mouth daily. 07/07/22   Nelwyn Salisbury, MD  latanoprost (XALATAN) 0.005 % ophthalmic solution SMARTSIG:In Eye(s) 12/08/19   [provider]  lisinopril-hydrochlorothiazide  (ZESTORETIC) 10-12.5 MG tablet TAKE ONE TABLET EVERY DAY 09/14/22   Nelwyn Salisbury, MD  omeprazole (PRILOSEC) 40 MG capsule TAKE ONE CAPSULE BY MOUTH TWICE DAILY 10/15/22   Nelwyn Salisbury, MD  Syringe/Needle, Disp, (SYRINGE 3CC/22GX1-1/2") 22G X 1-1/2" 3 ML MISC 1 Application by Does not apply route every 7 (seven) days. 02/18/22   Nelwyn Salisbury, MD  tadalafil (CIALIS) 20 MG tablet Take 1 tablet (20 mg total) by mouth as needed for erectile dysfunction. Patient taking differently: Take 20 mg by mouth daily. 05/01/22   Nelwyn Salisbury, MD  tamsulosin (FLOMAX) 0.4 MG CAPS capsule Take 2 capsules (0.8 mg total) by mouth daily. 07/27/22   Nelwyn Salisbury, MD  temazepam (RESTORIL) 30 MG capsule TAKE ONE CAPSULE BY MOUTH AT BEDTIME AS NEEDED FOR SLEEP 10/19/22   Nelwyn Salisbury, MD  testosterone cypionate (DEPOTESTOSTERONE CYPIONATE) 200 MG/ML injection Inject 1 mL (200 mg total) into the muscle every 7 (seven) days. 09/15/22   Nelwyn Salisbury, MD     Family History  Problem Relation Age of Onset   Alzheimer's disease Other    Colon cancer Neg Hx    Esophageal cancer Neg Hx    Pancreatic cancer Neg Hx    Prostate cancer Neg Hx    Rectal cancer Neg Hx    Stomach cancer Neg Hx     Social History   Socioeconomic History   Marital status: Widowed    Spouse name: Not on file   Number of children: Not on file   Years of education: Not on file   Highest education level: Not on file  Occupational History   Not on file  Tobacco Use   Smoking status: Former    Current packs/day: 0.00    Types: Cigarettes    Quit date: 02/09/1974    Years since quitting: 48.9   Smokeless tobacco: Never   Tobacco comments:    quit over 20 yo, absolutely smoked more than 100 cig; referred for [employer]   Vaping Use   Vaping status: Never Used  Substance and Sexual Activity   Alcohol use: Yes    Alcohol/week: 14.0 standard drinks of alcohol    Types: 14 Shots of liquor per week    Comment: drinks a couple shots daily   Drug  use: Yes    Types: Marijuana    Comment: occas uses 1 time every other month - this past weekend   Sexual activity: Not Currently  Other Topics Concern   Not on file  Social History Narrative   Lives with spouse who had progressive disease   24/7 care   2 children   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      Social Determinants of Health   Financial Resource Strain: Low Risk  (01/09/2023)   Overall Financial Resource Strain (CARDIA)    Difficulty of Paying Living Expenses: Not hard at all  Food Insecurity: No Food Insecurity (  01/09/2023)   Hunger Vital Sign    Worried About Running Out of Food in the Last Year: Never true    Ran Out of Food in the Last Year: Never true  Transportation Needs: No Transportation Needs (01/09/2023)   PRAPARE - Administrator, Civil Service (Medical): No    Lack of Transportation (Non-Medical): No  Physical Activity: Insufficiently Active (01/09/2023)   Exercise Vital Sign    Days of Exercise per Week: 3 days    Minutes of Exercise per Session: 30 min  Stress: No Stress Concern Present (01/09/2023)   [employer] of Occupational Health - Occupational Stress Questionnaire    Feeling of Stress : Only a little  Social Connections: Unknown (01/09/2023)   Social Connection and Isolation Panel [NHANES]    Frequency of Communication with Friends and Family: More than three times a week    Frequency of Social Gatherings with Friends and Family: More than three times a week    Attends Religious Services: Not on file    Active Member of Clubs or Organizations: No    Attends Banker Meetings: Never    Marital Status: Widowed    Review of Systems: A 12 point ROS discussed and pertinent positives are indicated in the HPI above.  All other systems are negative.  Review of Systems  Constitutional:  Negative for activity change, fatigue and fever.  Respiratory:  Negative for shortness of breath.   Cardiovascular:  Negative for  chest pain.  Genitourinary:  Positive for dysuria and urgency.  Neurological:  Negative for weakness.  Psychiatric/Behavioral:  Negative for behavioral problems and confusion.     Vital Signs: BP (!) 149/63   Pulse 61   Temp 98.3 F (36.8 C) (Oral)   Resp 17   Ht 6' (1.829 m)   Wt 218 lb (98.9 kg)   SpO2 98%   BMI 29.57 kg/m   Advance Care Plan: The advanced care plan/surrogate decision maker was discussed at the time of visit and documented in the medical record.    Physical Exam Vitals reviewed.  HENT:     Mouth/Throat:     Mouth: Mucous membranes are moist.  Cardiovascular:     Rate and Rhythm: Normal rate and regular rhythm.     Heart sounds: Normal heart sounds.  Pulmonary:     Effort: Pulmonary effort is normal.     Breath sounds: Normal breath sounds. No wheezing.  Abdominal:     Palpations: Abdomen is soft.     Tenderness: There is no abdominal tenderness.  Musculoskeletal:        General: Normal range of motion.  Skin:    General: Skin is warm.  Neurological:     Mental Status: He is alert and oriented to person, place, and time.  Psychiatric:        Behavior: Behavior normal.     Imaging: No results found.  Labs:  CBC: Recent Labs    02/10/22 1506 06/03/22 1159 06/16/22 0906 06/17/22 0334  WBC 6.6 6.2 5.7 10.0  HGB 14.8 16.7 14.9 14.2  HCT 43.9 50.8 45.3 42.2  PLT 161.0 131* 97* 132*    COAGS: No results for input(s): "INR", "APTT" in the last 8760 hours.  BMP: Recent Labs    02/10/22 1506 06/03/22 1159 06/16/22 0538 06/17/22 0334  NA 134* 137 134* 134*  K 4.1 4.8 3.7 4.0  CL 97 101 101 102  CO2 26 27 24 24   GLUCOSE 118* 111*  144* 129*  BUN 21 28* 19 24*  CALCIUM 9.9 9.3 8.7* 8.1*  CREATININE 1.12 1.74* 1.10 1.26*  GFRNONAA  --  39* >60 57*    LIVER FUNCTION TESTS: No results for input(s): "BILITOT", "AST", "ALT", "ALKPHOS", "PROT", "ALBUMIN" in the last 8760 hours.  TUMOR MARKERS: No results for input(s): "AFPTM",  "CEA", "CA199", "CHROMGRNA" in the last 8760 hours.  Assessment and Plan:  Scheduled today for Prostate artery embolization Risks and benefits of Prostate artery embolization were discussed with the patient including, but not limited to bleeding, infection, vascular injury or contrast induced renal failure.  This interventional procedure involves the use of X-rays and because of the nature of the planned procedure, it is possible that we will have prolonged use of X-ray fluoroscopy.  Potential radiation risks to you include (but are not limited to) the following: - A slightly elevated risk for cancer  several years later in life. This risk is typically less than 0.5% percent. This risk is low in comparison to the normal incidence of human cancer, which is 33% for women and 50% for men according to the American Cancer Society. - Radiation induced injury can include skin redness, resembling a rash, tissue breakdown / ulcers and hair loss (which can be temporary or permanent).   The likelihood of either of these occurring depends on the difficulty of the procedure and whether you are sensitive to radiation due to previous procedures, disease, or genetic conditions.   IF your procedure requires a prolonged use of radiation, you will be notified and given written instructions for further action.  It is your responsibility to monitor the irradiated area for the 2 weeks following the procedure and to notify your physician if you are concerned that you have suffered a radiation induced injury.    All of the patient's questions were answered, patient is agreeable to proceed.  Consent signed and in chart.  Thank you for this interesting consult.  I greatly enjoyed meeting Benjamin [name] and look forward to participating in their care.  A copy of this report was sent to the requesting provider on this date.  Electronically Signed: Robet Leu, PA-C 01/13/2023, 11:36 AM   I spent a total of     25 Minutes in face to face in clinical consultation, greater than 50% of which was counseling/coordinating care for PAE

## 2023-01-13 NOTE — Discharge Instructions (Signed)
Femoral Site Care This sheet gives you information about how to care for yourself after your procedure. Your health care provider may also give you more specific instructions. If you have problems or questions, contact your health care provider. What can I expect after the procedure? After the procedure, it is common to have: Bruising that usually fades within 1-2 weeks. Tenderness at the site. Follow these instructions at home: Wound care May remove bandage after 24 hours. Do not take baths, swim, or use a hot tub for 5 days. You may shower 24-48 hours after the procedure. Gently wash the site with plain soap and water. Pat the area dry with a clean towel. Do not rub the site. This may cause bleeding. Do not apply powder or lotion to the site. Keep the site clean and dry. Check your femoral site every day for signs of infection. Check for: Redness, swelling, or pain. Fluid or blood. Warmth. Pus or a bad smell. Activity For the first 2-3 days after your procedure, or as long as directed: Avoid climbing stairs as much as possible. Do not squat. Do not lift, push or pull anything that is heavier than 10 lb for 5 days. Rest as directed. Avoid sitting for a long time without moving. Get up to take short walks every 1-2 hours. Do not drive for 24 hours. General instructions Take over-the-counter and prescription medicines only as told by your health care provider. Keep all follow-up visits as told by your health care provider. This is important. DRINK PLENTY OF FLUIDS FOR THE NEXT 2-3 DAYS. Contact a health care provider if you have: A fever or chills. You have redness, swelling, or pain around your insertion site. Get help right away if: The catheter insertion area swells very fast. You pass out. You suddenly start to sweat or your skin gets clammy. The catheter insertion area is bleeding, and the bleeding does not stop when you hold steady pressure on the area. The area near or  just beyond the catheter insertion site becomes pale, cool, tingly, or numb. These symptoms may represent a serious problem that is an emergency. Do not wait to see if the symptoms will go away. Get medical help right away. Call your local emergency services (911 in the [place] Do not drive yourself to the hospital. Summary After the procedure, it is common to have bruising that usually fades within 1-2 weeks. Check your femoral site every day for signs of infection. Do not lift, push or pull anything that is heavier than 10 lb for 5 days.  This information is not intended to replace advice given to you by your health care provider. Make sure you discuss any questions you have with your health care provider. Document Revised: 02/08/2017 Document Reviewed: 02/08/2017 Elsevier Patient Education  2020 Elsevier Inc.  

## 2023-01-14 ENCOUNTER — Telehealth (HOSPITAL_COMMUNITY): Payer: Self-pay | Admitting: Student

## 2023-01-14 DIAGNOSIS — N138 Other obstructive and reflux uropathy: Secondary | ICD-10-CM

## 2023-01-14 NOTE — Telephone Encounter (Signed)
Patient s/p prostate artery embolization 01/13/23 with post-procedure urinary retention requiring foley catheter placement. Patient will be scheduled to follow up with Dr. Elby Showers at our outpatient clinic Monday 01/18/23 for a voiding trial.   Patient aware that a scheduler from our clinic will call him with a time. Patient knows to call me with any questions/concerns/issues prior to his appointment.  Alwyn Ren, [place] [phone] 01/14/2023, 12:14 PM

## 2023-01-15 ENCOUNTER — Other Ambulatory Visit: Payer: Self-pay | Admitting: Interventional Radiology

## 2023-01-15 DIAGNOSIS — N138 Other obstructive and reflux uropathy: Secondary | ICD-10-CM

## 2023-01-18 ENCOUNTER — Ambulatory Visit
Admission: RE | Admit: 2023-01-18 | Discharge: 2023-01-18 | Disposition: A | Discharge status: Home / Self Care | Payer: Medicare Other | Source: Ambulatory Visit | Attending: Student | Admitting: Student

## 2023-01-18 ENCOUNTER — Ambulatory Visit
Admission: RE | Admit: 2023-01-18 | Discharge: 2023-01-18 | Disposition: A | Discharge status: Home / Self Care | Payer: Medicare Other | Source: Ambulatory Visit | Attending: Interventional Radiology | Admitting: Interventional Radiology

## 2023-01-18 ENCOUNTER — Telehealth (HOSPITAL_COMMUNITY): Payer: Self-pay | Admitting: Student

## 2023-01-18 ENCOUNTER — Other Ambulatory Visit: Payer: Self-pay | Admitting: Family Medicine

## 2023-01-18 DIAGNOSIS — N138 Other obstructive and reflux uropathy: Secondary | ICD-10-CM

## 2023-01-18 DIAGNOSIS — R339 Retention of urine, unspecified: Secondary | ICD-10-CM | POA: Diagnosis not present

## 2023-01-18 MED ORDER — CIPROFLOXACIN HCL 500 MG PO TABS: 500.0000 mg | ORAL_TABLET | Freq: Two times a day (BID) | ORAL | 0 refills | Status: DC

## 2023-01-18 NOTE — Telephone Encounter (Signed)
Cipro refill e-prescribed to patient's pharmacy. Patient aware.  Benjamin [name], [place] [phone] 01/18/2023, 3:43 PM

## 2023-01-23 ENCOUNTER — Other Ambulatory Visit: Payer: Self-pay | Admitting: Physician Assistant

## 2023-01-23 MED ORDER — METHYLPREDNISOLONE 4 MG PO TBPK: ORAL_TABLET | ORAL | 0 refills | Status: DC

## 2023-01-25 ENCOUNTER — Other Ambulatory Visit: Payer: Self-pay | Admitting: Family Medicine

## 2023-01-28 NOTE — Telephone Encounter (Signed)
Pt LOV was on 11/13/22 Last refill was done on 09/15/22 Please advise

## 2023-02-05 NOTE — Telephone Encounter (Signed)
Patient is calling in about his medication his antibiotic he would like a call back

## 2023-02-08 ENCOUNTER — Telehealth: Payer: Self-pay

## 2023-02-08 NOTE — Telephone Encounter (Signed)
Copied from CRM [id number]. Topic: Clinical - Medical Advice >> Feb 05, 2023 11:39 AM Fuller Mandril wrote: Reason for CRM: Pt states he is starting to have another concern with a cold and respiratory symptoms like he was treated for previously. Would like to know if provider could send in the same Rx that was previously prescribed for this issue. Antibiotic to [facility]. Pt was unsure of the name but stated provider would know what is is and can reach out to him with any questions or concerns. Thank You

## 2023-02-09 NOTE — Telephone Encounter (Signed)
Call in Augmentin [phone] to take BID for 10 days

## 2023-02-10 HISTORY — PX: EMBOLIZATION: SHX1496

## 2023-02-11 ENCOUNTER — Other Ambulatory Visit: Payer: Self-pay

## 2023-02-11 ENCOUNTER — Telehealth: Payer: Self-pay | Admitting: *Deleted

## 2023-02-11 DIAGNOSIS — E291 Testicular hypofunction: Secondary | ICD-10-CM | POA: Diagnosis not present

## 2023-02-11 MED ORDER — AMOXICILLIN-POT CLAVULANATE 875-125 MG PO TABS: 1.0000 | ORAL_TABLET | Freq: Two times a day (BID) | ORAL | 0 refills | Status: DC

## 2023-02-11 NOTE — Telephone Encounter (Signed)
 Copied from CRM [id number]. Topic: Appointments - Appointment Scheduling >> Feb 11, 2023 11:05 AM [place] A wrote: Patient would like to schedule Physical and Patient would also like to know if he should have labs completed before Physical or after?

## 2023-02-11 NOTE — Telephone Encounter (Signed)
 Pt New Rx for Augmentin sent to pt pharmacy, pt was notified

## 2023-02-12 ENCOUNTER — Other Ambulatory Visit: Payer: Self-pay | Admitting: Interventional Radiology

## 2023-02-12 DIAGNOSIS — N138 Other obstructive and reflux uropathy: Secondary | ICD-10-CM

## 2023-02-15 ENCOUNTER — Inpatient Hospital Stay
Admission: RE | Admit: 2023-02-15 | Discharge: 2023-02-15 | Disposition: A | Discharge status: Home / Self Care | Payer: Medicare Other | Source: Ambulatory Visit | Attending: Interventional Radiology | Admitting: Interventional Radiology

## 2023-02-15 ENCOUNTER — Ambulatory Visit: Payer: Self-pay | Admitting: Family Medicine

## 2023-02-15 NOTE — Telephone Encounter (Signed)
 Copied from CRM [id number]. Topic: Clinical - Pink Word Triage >> Feb 15, 2023  9:54 AM Merlynn A wrote: Reason for Triage: Diarrhea   Chief Complaint: diarrhea  Symptoms: up to 6 stools daily Frequency: ongoing for 6 days Pertinent Negatives: Patient denies abdominal pain Disposition: [] ED /[] Urgent Care (no appt availability in office) / [x] Appointment(In office/virtual)/ []  [facility]/ [] Home Care/ [] Refused Recommended Disposition /[] [facility]/ []  Follow-up with PCP Additional Notes:  The patient reported diarrhea that he described as wet mud for the past 6 days, today is day 6.  He denied black or tarry stools.  It has been green.  He has been taking imodium to control it and it has been helpful.  He is taking Amoxicillin  for a cold.  He stated that the diarrhea did not begin when he started the antibiotic.  He reported that he has been able to eat and drink normally but the loose stool is worse in the mornings.  He was scheduled for an appointment tomorrow morning.  Please contact the patient if a earlier slot becomes available. Reason for Disposition  [1] MODERATE diarrhea (e.g., 4-6 times / day more than normal) AND [2] age > 70 years  Answer Assessment - Initial Assessment Questions 1. DIARRHEA SEVERITY: How bad is the diarrhea? How many more stools have you had in the past 24 hours than normal?    - NO DIARRHEA (SCALE 0)   - MILD (SCALE 1-3): Few loose or mushy BMs; increase of 1-3 stools over normal daily number of stools; mild increase in ostomy output.   -  MODERATE (SCALE 4-7): Increase of 4-6 stools daily over normal; moderate increase in ostomy output.   -  SEVERE (SCALE 8-10; OR WORST POSSIBLE): Increase of 7 or more stools daily over normal; moderate increase in ostomy output; incontinence.     Twice today  2. ONSET: When did the diarrhea begin?      6 days  3. BM CONSISTENCY: How loose or watery is the diarrhea?      Loose - like wet  mud  Dark green  4. VOMITING: Are you also vomiting? If Yes, ask: How many times in the past 24 hours?      None  5. ABDOMEN PAIN: Are you having any abdomen pain? If Yes, ask: What does it feel like? (e.g., crampy, dull, intermittent, constant)      None  6. ABDOMEN PAIN SEVERITY: If present, ask: How bad is the pain?  (e.g., Scale 1-10; mild, moderate, or severe)   - MILD (1-3): doesn't interfere with normal activities, abdomen soft and not tender to touch    - MODERATE (4-7): interferes with normal activities or awakens from sleep, abdomen tender to touch    - SEVERE (8-10): excruciating pain, doubled over, unable to do any normal activities       None  7. ORAL INTAKE: If vomiting, Have you been able to drink liquids? How much liquids have you had in the past 24 hours?     Has been eating normally  8. HYDRATION: Any signs of dehydration? (e.g., dry mouth [not just dry lips], too weak to stand, dizziness, new weight loss) When did you last urinate?     Drinking water   10. ANTIBIOTIC USE: Are you taking antibiotics now or have you taken antibiotics in the past 2 months?       Amoxicillin   11. OTHER SYMPTOMS: Do you have any other symptoms? (e.g., fever, blood in  stool)      Bloated  Protocols used: [employer]

## 2023-02-15 NOTE — Telephone Encounter (Signed)
 Pt has appointment scheduled on 02/16/23 with Dr Ardyth Harps

## 2023-02-16 ENCOUNTER — Encounter: Payer: Self-pay | Admitting: Internal Medicine

## 2023-02-16 ENCOUNTER — Ambulatory Visit (INDEPENDENT_AMBULATORY_CARE_PROVIDER_SITE_OTHER): Payer: Medicare Other | Admitting: Internal Medicine

## 2023-02-16 VITALS — BP 155/74 | HR 79 | Temp 97.8°F | Wt 226.3 lb

## 2023-02-16 DIAGNOSIS — A084 Viral intestinal infection, unspecified: Secondary | ICD-10-CM

## 2023-02-16 NOTE — Progress Notes (Signed)
 Established Patient Office Visit     CC/Reason for Visit: Diarrhea  HPI: Benjamin [name] is a 83 y.o. male who is coming in today for the above mentioned reasons.  Has been having diarrhea for about 1 week.  2 days into his diarrheal illness he also started having URI symptoms.  He apparently called into the office and was given a prescription of amoxicillin  by his PCP.  Diarrhea seems to be improving over the last day and a half.  Initially he was having up to 6 episodes a day of watery stools.  No vomiting or abdominal pain.   Past Medical/Surgical History: Past Medical History:  Diagnosis Date   Allergy    Anxiety    Arthritis    BPH (benign prostatic hyperplasia)    with elevated PSA, sees Dr. Matilda, benign   Cataract    Elevated PSA    sees Dr. Matilda    GERD (gastroesophageal reflux disease)    Hyperlipidemia    Hypertension    Insomnia    Platelets decreased (HCC)    Pneumonia    walking PNA   Pre-diabetes    No meds    Past Surgical History:  Procedure Laterality Date   CHOLECYSTECTOMY N/A 01/10/2014   Procedure: LAPAROSCOPIC CHOLECYSTECTOMY WITH INTRAOPERATIVE CHOLANGIOGRAM;  Surgeon: Donnice KATHEE Lunger, MD;  Location: [facility];  Service: General;  Laterality: N/A;   COLONOSCOPY  08/03/2018   per Dr. Avram, adenomatous polyps, no repeats needed    IR ANGIOGRAM PELVIS SELECTIVE OR SUPRASELECTIVE  01/13/2023   IR ANGIOGRAM SELECTIVE EACH ADDITIONAL VESSEL  01/13/2023   IR EMBO TUMOR ORGAN ISCHEMIA INFARCT INC GUIDE ROADMAPPING  01/13/2023   IR RADIOLOGIST EVAL & MGMT  11/18/2022   IR US  GUIDE VASC ACCESS LEFT  01/13/2023   IR US  GUIDE VASC ACCESS RIGHT  01/13/2023   JOINT REPLACEMENT     Right hip arthroplasty Dr. Ernie 01/05/17    left knee arthroscopy      LIPOSUCTION  1987   prostate biopsies in 2003 and again 05/2008     per Dr. Matilda. benign   REFRACTIVE SURGERY Bilateral    cataract surgery   repair  of large umblicial hernia     per Dr.  Adina Lunger on 01/10/10   right knee arthroscopy     TONSILLECTOMY     TOTAL HIP ARTHROPLASTY Right 01/05/2017   Procedure: RIGHT TOTAL HIP ARTHROPLASTY ANTERIOR APPROACH;  Surgeon: Ernie Donnice, MD;  Location: [facility];  Service: Orthopedics;  Laterality: Right;  70 mins   TOTAL KNEE ARTHROPLASTY Left 07/16/2014   Procedure: LEFT TOTAL KNEE ARTHROPLASTY;  Surgeon: Donnice Ernie, MD;  Location: [facility];  Service: Orthopedics;  Laterality: Left;   TOTAL KNEE ARTHROPLASTY Right 06/16/2022   Procedure: TOTAL KNEE ARTHROPLASTY;  Surgeon: Ernie Donnice, MD;  Location: [facility];  Service: Orthopedics;  Laterality: Right;    Social History:  reports that he quit smoking about 49 years ago. His smoking use included cigarettes. He has never used smokeless tobacco. He reports current alcohol use of about 14.0 standard drinks of alcohol per week. He reports current drug use. Drug: Marijuana.  Allergies: Allergies  Allergen Reactions   Norco [Hydrocodone -Acetaminophen ]     Hallucinations     Family History:  Family History  Problem Relation Age of Onset   Alzheimer's disease Other    Colon cancer Neg Hx    Esophageal cancer Neg Hx    Pancreatic cancer Neg Hx  Prostate cancer Neg Hx    Rectal cancer Neg Hx    Stomach cancer Neg Hx      Current Outpatient Medications:    ALPRAZolam  (XANAX ) 1 MG tablet, TAKE ONE TABLET BY MOUTH EVERY 6 HOURS AS NEEDED FOR ANXIETY, Disp: 120 tablet, Rfl: 5   amoxicillin -clavulanate (AUGMENTIN ) 875-125 MG tablet, Take 1 tablet by mouth 2 (two) times daily., Disp: 20 tablet, Rfl: 0   atorvastatin  (LIPITOR) 20 MG tablet, Take 1 tablet (20 mg total) by mouth daily., Disp: 90 tablet, Rfl: 3   latanoprost  (XALATAN ) 0.005 % ophthalmic solution, SMARTSIG:In Eye(s), Disp: , Rfl:    lisinopril -hydrochlorothiazide  (ZESTORETIC ) 10-12.5 MG tablet, TAKE ONE TABLET EVERY DAY, Disp: 90 tablet, Rfl: 1   omeprazole  (PRILOSEC ) 40 MG capsule, TAKE ONE CAPSULE BY MOUTH TWICE DAILY,  Disp: 180 capsule, Rfl: 0   Syringe/Needle, Disp, (SYRINGE 3CC/22GX1-1/2) 22G X 1-1/2 3 ML MISC, 1 Application by Does not apply route every 7 (seven) days., Disp: 50 each, Rfl: 0   tadalafil  (CIALIS ) 20 MG tablet, Take 1 tablet (20 mg total) by mouth as needed for erectile dysfunction. (Patient taking differently: Take 20 mg by mouth daily.), Disp: 30 tablet, Rfl: 11   tamsulosin  (FLOMAX ) 0.4 MG CAPS capsule, Take 2 capsules (0.8 mg total) by mouth daily., Disp: 180 capsule, Rfl: 3   temazepam  (RESTORIL ) 30 MG capsule, TAKE ONE CAPSULE BY MOUTH AT BEDTIME AS NEEDED FOR SLEEP, Disp: 30 capsule, Rfl: 5   testosterone  cypionate (DEPOTESTOSTERONE CYPIONATE) 200 MG/ML injection, Inject 1 mL (200 mg total) into the muscle every 7 (seven) days., Disp: 10 mL, Rfl: 2 No current facility-administered medications for this visit.  Facility-Administered Medications Ordered in Other Visits:    lidocaine  (XYLOCAINE ) 2 % jelly 1 Application, 1 Application, Topical, Once, Christine Males, PA-C  Review of Systems:  Negative unless indicated in HPI.   Physical Exam: Vitals:   02/16/23 1007 02/16/23 1008  BP: (!) 150/80 (!) 155/74  Pulse: 79   Temp: 97.8 F (36.6 C)   TempSrc: Oral   SpO2: 98%   Weight: 226 lb 4.8 oz (102.6 kg)     Body mass index is 30.69 kg/m.   Physical Exam Vitals reviewed.  Constitutional:      Appearance: Normal appearance.  HENT:     Right Ear: Tympanic membrane, ear canal and external ear normal.     Left Ear: Tympanic membrane, ear canal and external ear normal.     Mouth/Throat:     Mouth: Mucous membranes are moist.     Pharynx: Oropharynx is clear.  Eyes:     Conjunctiva/sclera: Conjunctivae normal.     Pupils: Pupils are equal, round, and reactive to light.  Cardiovascular:     Rate and Rhythm: Normal rate and regular rhythm.  Pulmonary:     Effort: Pulmonary effort is normal.     Breath sounds: Normal breath sounds.  Abdominal:     General: Abdomen is  flat. Bowel sounds are normal.     Palpations: Abdomen is soft.     Tenderness: There is no abdominal tenderness.  Neurological:     Mental Status: He is alert.      Impression and Plan:  Viral enteritis  -Suspect this to be a viral enteritis, likely norovirus as we currently have an outbreak of this in the community.  He seems to already be improving.  Nothing further other than advised to aggressively hydrate with oral fluids and electrolytes.   Time spent:22 minutes reviewing  chart, interviewing and examining patient and formulating plan of care.     Tully Theophilus Andrews, MD [facility]

## 2023-02-18 DIAGNOSIS — N401 Enlarged prostate with lower urinary tract symptoms: Secondary | ICD-10-CM | POA: Diagnosis not present

## 2023-02-18 DIAGNOSIS — R972 Elevated prostate specific antigen [PSA]: Secondary | ICD-10-CM | POA: Diagnosis not present

## 2023-02-18 DIAGNOSIS — E291 Testicular hypofunction: Secondary | ICD-10-CM | POA: Diagnosis not present

## 2023-02-18 DIAGNOSIS — R3914 Feeling of incomplete bladder emptying: Secondary | ICD-10-CM | POA: Diagnosis not present

## 2023-02-26 ENCOUNTER — Encounter: Payer: Self-pay | Admitting: Family Medicine

## 2023-02-26 ENCOUNTER — Ambulatory Visit (INDEPENDENT_AMBULATORY_CARE_PROVIDER_SITE_OTHER): Payer: Medicare Other | Admitting: Family Medicine

## 2023-02-26 VITALS — BP 124/58 | HR 75 | Temp 97.5°F | Wt 223.0 lb

## 2023-02-26 DIAGNOSIS — E119 Type 2 diabetes mellitus without complications: Secondary | ICD-10-CM | POA: Diagnosis not present

## 2023-02-26 DIAGNOSIS — R1010 Upper abdominal pain, unspecified: Secondary | ICD-10-CM

## 2023-02-26 DIAGNOSIS — E785 Hyperlipidemia, unspecified: Secondary | ICD-10-CM

## 2023-02-26 DIAGNOSIS — G4700 Insomnia, unspecified: Secondary | ICD-10-CM

## 2023-02-26 DIAGNOSIS — F418 Other specified anxiety disorders: Secondary | ICD-10-CM | POA: Diagnosis not present

## 2023-02-26 DIAGNOSIS — E559 Vitamin D deficiency, unspecified: Secondary | ICD-10-CM

## 2023-02-26 DIAGNOSIS — N401 Enlarged prostate with lower urinary tract symptoms: Secondary | ICD-10-CM | POA: Diagnosis not present

## 2023-02-26 DIAGNOSIS — I10 Essential (primary) hypertension: Secondary | ICD-10-CM | POA: Diagnosis not present

## 2023-02-26 DIAGNOSIS — N138 Other obstructive and reflux uropathy: Secondary | ICD-10-CM

## 2023-02-26 DIAGNOSIS — N529 Male erectile dysfunction, unspecified: Secondary | ICD-10-CM

## 2023-02-26 DIAGNOSIS — E538 Deficiency of other specified B group vitamins: Secondary | ICD-10-CM

## 2023-02-26 DIAGNOSIS — R972 Elevated prostate specific antigen [PSA]: Secondary | ICD-10-CM

## 2023-02-26 DIAGNOSIS — R748 Abnormal levels of other serum enzymes: Secondary | ICD-10-CM

## 2023-02-26 LAB — LIPASE: Lipase: 159 U/L — ABNORMAL HIGH (ref 11.0–59.0)

## 2023-02-26 LAB — CBC WITH DIFFERENTIAL/PLATELET
Basophils Absolute: 0 10*3/uL (ref 0.0–0.1)
Basophils Relative: 0.5 % (ref 0.0–3.0)
Eosinophils Absolute: 0.2 10*3/uL (ref 0.0–0.7)
Eosinophils Relative: 3.1 % (ref 0.0–5.0)
HCT: 44.9 % (ref 39.0–52.0)
Hemoglobin: 15.3 g/dL (ref 13.0–17.0)
Lymphocytes Relative: 26.6 % (ref 12.0–46.0)
Lymphs Abs: 1.4 10*3/uL (ref 0.7–4.0)
MCHC: 34 g/dL (ref 30.0–36.0)
MCV: 92.9 fL (ref 78.0–100.0)
Monocytes Absolute: 0.5 10*3/uL (ref 0.1–1.0)
Monocytes Relative: 9.4 % (ref 3.0–12.0)
Neutro Abs: 3.1 10*3/uL (ref 1.4–7.7)
Neutrophils Relative %: 60.4 % (ref 43.0–77.0)
Platelets: 145 10*3/uL — ABNORMAL LOW (ref 150.0–400.0)
RBC: 4.84 Mil/uL (ref 4.22–5.81)
RDW: 14.1 % (ref 11.5–15.5)
WBC: 5.1 10*3/uL (ref 4.0–10.5)

## 2023-02-26 LAB — LIPID PANEL
Cholesterol: 157 mg/dL (ref 0–200)
HDL: 52.2 mg/dL (ref >39.00)
LDL Cholesterol: 80 mg/dL (ref 0–99)
NonHDL: 104.4
Total CHOL/HDL Ratio: 3
Triglycerides: 122 mg/dL (ref 0.0–149.0)
VLDL: 24.4 mg/dL (ref 0.0–40.0)

## 2023-02-26 LAB — TSH: TSH: 2.25 u[IU]/mL (ref 0.35–5.50)

## 2023-02-26 LAB — HEPATIC FUNCTION PANEL
ALT: 26 U/L (ref 0–53)
AST: 24 U/L (ref 0–37)
Albumin: 4.3 g/dL (ref 3.5–5.2)
Alkaline Phosphatase: 55 U/L (ref 39–117)
Bilirubin, Direct: 0.2 mg/dL (ref 0.0–0.3)
Total Bilirubin: 0.9 mg/dL (ref 0.2–1.2)
Total Protein: 6.6 g/dL (ref 6.0–8.3)

## 2023-02-26 LAB — HEMOGLOBIN A1C: Hgb A1c MFr Bld: 5.9 % (ref 4.6–6.5)

## 2023-02-26 LAB — VITAMIN B12: Vitamin B-12: 306 pg/mL (ref 211–911)

## 2023-02-26 LAB — BASIC METABOLIC PANEL
BUN: 24 mg/dL — ABNORMAL HIGH (ref 6–23)
CO2: 25 meq/L (ref 19–32)
Calcium: 9.5 mg/dL (ref 8.4–10.5)
Chloride: 101 meq/L (ref 96–112)
Creatinine, Ser: 1.18 mg/dL (ref 0.40–1.50)
GFR: 57.67 mL/min — ABNORMAL LOW (ref >60.00)
Glucose, Bld: 114 mg/dL — ABNORMAL HIGH (ref 70–99)
Potassium: 4.3 meq/L (ref 3.5–5.1)
Sodium: 136 meq/L (ref 135–145)

## 2023-02-26 LAB — VITAMIN D 25 HYDROXY (VIT D DEFICIENCY, FRACTURES): VITD: 32.59 ng/mL (ref 30.00–100.00)

## 2023-02-26 NOTE — Progress Notes (Signed)
Subjective:    Patient ID: Benjamin [name], male    DOB: Jun 28, 1940, 83 y.o.   MRN: [id number]  HPI Here to follow up on issues. His main concerns today are right knee pain, generalized fatigue, and bladder outlet obstruction. He had a total right knee replacement per Dr. Charlann Boxer 6 months ago, and at first it seemed to be healing well. Then one day after doing a lot of walking, the knee began to have pain again. This is not severe, but it bothers him all the time. He also says he feels tired most of the time, even though he gets plenty of sleep and his appetite is good. As for the BOO, he sees Dr. Alvester Morin for his BPH and ED. His testosterone level 2 weeks ago was in the 400's range. He wore a foley catheter for awhile and he did self in and out caths for awhile. This has improved now so ha can pass urine without a catheter. His BP is stable. His depression and anxiety are stable.    Review of Systems  Constitutional:  Positive for fatigue.  HENT: Negative.    Eyes: Negative.   Respiratory: Negative.    Cardiovascular: Negative.   Gastrointestinal: Negative.   Genitourinary:  Positive for difficulty urinating.  Musculoskeletal:  Positive for arthralgias.  Skin: Negative.   Neurological: Negative.   Psychiatric/Behavioral: Negative.         Objective:   Physical Exam Constitutional:      General: He is not in acute distress.    Appearance: Normal appearance. He is well-developed. He is not diaphoretic.  HENT:     Head: Normocephalic and atraumatic.     Right Ear: External ear normal.     Left Ear: External ear normal.     Nose: Nose normal.     Mouth/Throat:     Pharynx: No oropharyngeal exudate.  Eyes:     General: No scleral icterus.       Right eye: No discharge.        Left eye: No discharge.     Conjunctiva/sclera: Conjunctivae normal.     Pupils: Pupils are equal, round, and reactive to light.  Neck:     Thyroid: No thyromegaly.     Vascular: No JVD.     Trachea: No  tracheal deviation.  Cardiovascular:     Rate and Rhythm: Normal rate and regular rhythm.     Pulses: Normal pulses.     Heart sounds: Normal heart sounds. No murmur heard.    No friction rub. No gallop.  Pulmonary:     Effort: Pulmonary effort is normal. No respiratory distress.     Breath sounds: Normal breath sounds. No wheezing or rales.  Chest:     Chest wall: No tenderness.  Abdominal:     General: Bowel sounds are normal. There is no distension.     Palpations: Abdomen is soft. There is no mass.     Tenderness: There is no abdominal tenderness. There is no guarding or rebound.  Genitourinary:    Penis: No tenderness.   Musculoskeletal:        General: No tenderness. Normal range of motion.     Cervical back: Neck supple.  Lymphadenopathy:     Cervical: No cervical adenopathy.  Skin:    General: Skin is warm and dry.     Coloration: Skin is not pale.     Findings: No erythema or rash.  Neurological:     General: No  focal deficit present.     Mental Status: He is alert and oriented to person, place, and time.     Cranial Nerves: No cranial nerve deficit.     Motor: No abnormal muscle tone.     Coordination: Coordination normal.     Deep Tendon Reflexes: Reflexes are normal and symmetric. Reflexes normal.  Psychiatric:        Mood and Affect: Mood normal.        Behavior: Behavior normal.        Thought Content: Thought content normal.        Judgment: Judgment normal.           Assessment & Plan:  His HTN and depression and anxiety are well controlled. He will follow up with Dr. Alvester Morin about the BPH and testosterone replacement. He will follow up with Dr. Charlann Boxer about the knee pain. His insomnia is stable. We will get fasting labs to check lipids, an A1c, etc. We spent a total of (33   ) minutes reviewing records and discussing these issues.  Gershon Crane, MD

## 2023-03-01 NOTE — Addendum Note (Signed)
Addended by: Gershon Crane A on: 03/01/2023 01:01 PM   Modules accepted: Orders

## 2023-03-03 DIAGNOSIS — Z96651 Presence of right artificial knee joint: Secondary | ICD-10-CM | POA: Diagnosis not present

## 2023-03-03 DIAGNOSIS — M25561 Pain in right knee: Secondary | ICD-10-CM | POA: Diagnosis not present

## 2023-03-04 ENCOUNTER — Ambulatory Visit
Admission: RE | Admit: 2023-03-04 | Discharge: 2023-03-04 | Disposition: A | Discharge status: Home / Self Care | Payer: Medicare Other | Source: Ambulatory Visit | Attending: Family Medicine | Admitting: Family Medicine

## 2023-03-04 DIAGNOSIS — R748 Abnormal levels of other serum enzymes: Secondary | ICD-10-CM

## 2023-03-04 DIAGNOSIS — R1010 Upper abdominal pain, unspecified: Secondary | ICD-10-CM

## 2023-03-08 ENCOUNTER — Encounter: Payer: Self-pay | Admitting: Family Medicine

## 2023-03-11 NOTE — Telephone Encounter (Signed)
Spoke with pt reviewed [place] results verbalized understanding

## 2023-03-20 ENCOUNTER — Other Ambulatory Visit: Payer: Self-pay | Admitting: Family Medicine

## 2023-03-23 ENCOUNTER — Other Ambulatory Visit: Payer: Self-pay | Admitting: Family Medicine

## 2023-03-23 NOTE — Telephone Encounter (Signed)
Last Fill: 09/14/22  Last OV: 02/26/23 Next OV: 01/14/24 AWV  Routing to provider for review/authorization.

## 2023-03-24 MED ORDER — LISINOPRIL-HYDROCHLOROTHIAZIDE 10-12.5 MG PO TABS: 1.0000 | ORAL_TABLET | Freq: Every day | ORAL | 1 refills | Status: DC

## 2023-04-15 ENCOUNTER — Other Ambulatory Visit: Payer: Self-pay | Admitting: Family Medicine

## 2023-05-08 ENCOUNTER — Other Ambulatory Visit: Payer: Self-pay | Admitting: Family Medicine

## 2023-05-18 ENCOUNTER — Encounter: Payer: Self-pay | Admitting: Family Medicine

## 2023-05-18 ENCOUNTER — Ambulatory Visit (INDEPENDENT_AMBULATORY_CARE_PROVIDER_SITE_OTHER): Admitting: Family Medicine

## 2023-05-18 VITALS — BP 138/80 | HR 70 | Temp 97.8°F | Wt 230.0 lb

## 2023-05-18 DIAGNOSIS — F418 Other specified anxiety disorders: Secondary | ICD-10-CM

## 2023-05-18 DIAGNOSIS — L989 Disorder of the skin and subcutaneous tissue, unspecified: Secondary | ICD-10-CM | POA: Diagnosis not present

## 2023-05-18 DIAGNOSIS — I10 Essential (primary) hypertension: Secondary | ICD-10-CM

## 2023-05-18 MED ORDER — ALPRAZOLAM 0.5 MG PO TABS: 0.5000 mg | ORAL_TABLET | Freq: Four times a day (QID) | ORAL | 5 refills | Status: DC | PRN

## 2023-05-18 NOTE — Progress Notes (Signed)
   Subjective:    Patient ID: Serafin Decatur, male    DOB: [birth year], 83 y.o.   MRN: [id number]  HPI Here to follow up on HTN and anxiety. He has been doing well overall. He has been taking Xanax 1 mg for several years, and he wants to taper down on this. He still takes Temazepam for sleep. He has gained a little weight, so he plans to get back in the gym again. He has placed his house on the market, and he plans to move in with his girlfriend soon.    Review of Systems  Constitutional: Negative.   Respiratory: Negative.    Cardiovascular: Negative.   Psychiatric/Behavioral: Negative.         Objective:   Physical Exam Constitutional:      Appearance: Normal appearance.  Cardiovascular:     Rate and Rhythm: Normal rate.     Pulses: Normal pulses.     Heart sounds: Normal heart sounds.  Pulmonary:     Effort: Pulmonary effort is normal.     Breath sounds: Normal breath sounds.  Neurological:     Mental Status: He is alert.  Psychiatric:        Mood and Affect: Mood normal.        Behavior: Behavior normal.        Thought Content: Thought content normal.           Assessment & Plan:  His HTN is well controlled. His anxiety has been well controlled as well, so we will decrease the Xanax to 0.5 mg as needed. He has not seen a dermatologist for years, so we will refer him for a general skin exam.  Gershon Crane, MD

## 2023-05-29 ENCOUNTER — Other Ambulatory Visit: Payer: Self-pay | Admitting: Family Medicine

## 2023-06-01 DIAGNOSIS — R233 Spontaneous ecchymoses: Secondary | ICD-10-CM | POA: Diagnosis not present

## 2023-06-01 DIAGNOSIS — D225 Melanocytic nevi of trunk: Secondary | ICD-10-CM | POA: Diagnosis not present

## 2023-06-01 DIAGNOSIS — L821 Other seborrheic keratosis: Secondary | ICD-10-CM | POA: Diagnosis not present

## 2023-06-01 DIAGNOSIS — L814 Other melanin hyperpigmentation: Secondary | ICD-10-CM | POA: Diagnosis not present

## 2023-06-02 ENCOUNTER — Telehealth: Payer: Self-pay | Admitting: Family Medicine

## 2023-06-02 NOTE — Telephone Encounter (Signed)
 Copied from CRM [id number]. Topic: Clinical - Medication Refill >> Jun 02, 2023 10:16 AM Marlan Silva wrote: Most Recent Primary Care Visit:  Provider: Corita Diego A  Department: [facility]  Visit Type: OFFICE VISIT  Date: 05/18/2023  Medication: temazepam  (RESTORIL ) 30 MG capsule  Has the patient contacted their pharmacy? Yes (Agent: If no, request that the patient contact the pharmacy for the refill. If patient does not wish to contact the pharmacy document the reason why and proceed with request.) (Agent: If yes, when and what did the pharmacy advise?)  Is this the correct pharmacy for this prescription? Yes If no, delete pharmacy and type the correct one.  This is the patient's preferred pharmacy:  [facility] [place], [place] - [address] [place] [address] Phone: [phone] Fax: [phone]   Has the prescription been filled recently? No  Is the patient out of the medication? Yes  Has the patient been seen for an appointment in the last year OR does the patient have an upcoming appointment? Yes  Can we respond through MyChart? Yes  Agent: Please be advised that Rx refills may take up to 3 business days. We ask that you follow-up with your pharmacy.

## 2023-06-02 NOTE — Telephone Encounter (Signed)
 Copied from CRM [id number]. Topic: Clinical - Prescription Issue >> Jun 02, 2023 10:18 AM Marlan Silva wrote: Reason for CRM: Patient states that his pharmacy contacted dr frys office in regards to a refill of his temazepam  (RESTORIL ) 30 MG capsule. There is a pending medication refill request in the system as well. Patient is out of his medication and is asking for the prescription to be called in today to the pharmacy on file.

## 2023-06-03 NOTE — Telephone Encounter (Signed)
 Done on 06/03/23

## 2023-06-12 ENCOUNTER — Other Ambulatory Visit: Payer: Self-pay | Admitting: Family Medicine

## 2023-06-14 ENCOUNTER — Other Ambulatory Visit: Payer: Self-pay | Admitting: Family Medicine

## 2023-06-14 NOTE — Telephone Encounter (Signed)
 Copied from CRM [id number]. Topic: Clinical - Medication Refill >> Jun 14, 2023  1:55 PM Jethro Morrison wrote: Most Recent Primary Care Visit:  Provider: Corita Diego A  Department: [facility]  Visit Type: OFFICE VISIT  Date: 05/18/2023  Medication: testosterone  cypionate (DEPOTESTOSTERONE CYPIONATE) 200 MG/ML injection  Has the patient contacted their pharmacy? Yes (Agent: If no, request that the patient contact the pharmacy for the refill. If patient does not wish to contact the pharmacy document the reason why and proceed with request.) (Agent: If yes, when and what did the pharmacy advise?)  Is this the correct pharmacy for this prescription? Yes If no, delete pharmacy and type the correct one.  This is the patient's preferred pharmacy:  [facility] [place], [place] - [address] [place] [address] Phone: [phone] Fax: [phone]   Has the prescription been filled recently? Yes  Is the patient out of the medication? Yes  Has the patient been seen for an appointment in the last year OR does the patient have an upcoming appointment? Yes  Can we respond through MyChart? Yes  Agent: Please be advised that Rx refills may take up to 3 business days. We ask that you follow-up with your pharmacy.

## 2023-06-15 MED ORDER — TESTOSTERONE CYPIONATE 200 MG/ML IM SOLN: 200.0000 mg | INTRAMUSCULAR | 2 refills | Status: AC

## 2023-07-06 ENCOUNTER — Ambulatory Visit (INDEPENDENT_AMBULATORY_CARE_PROVIDER_SITE_OTHER): Admitting: Adult Health

## 2023-07-06 ENCOUNTER — Encounter: Payer: Self-pay | Admitting: Adult Health

## 2023-07-06 VITALS — BP 150/70 | HR 70 | Temp 97.9°F | Ht 72.0 in | Wt 227.0 lb

## 2023-07-06 DIAGNOSIS — I10 Essential (primary) hypertension: Secondary | ICD-10-CM

## 2023-07-06 DIAGNOSIS — J069 Acute upper respiratory infection, unspecified: Secondary | ICD-10-CM

## 2023-07-06 MED ORDER — AMOXICILLIN-POT CLAVULANATE 875-125 MG PO TABS: 1.0000 | ORAL_TABLET | Freq: Two times a day (BID) | ORAL | 0 refills | Status: DC

## 2023-07-06 MED ORDER — PREDNISONE 10 MG PO TABS
ORAL_TABLET | ORAL | 0 refills | Status: DC
Start: 2023-07-06 — End: 2023-09-06

## 2023-07-06 NOTE — Progress Notes (Signed)
 Subjective:    Patient ID: Samantha Olivera, male    DOB: [birth year], 83 y.o.   MRN: [id number]  Cough    83 year old male who  has a past medical history of Allergy, Anxiety, Arthritis, BPH (benign prostatic hyperplasia), Cataract, Elevated PSA, GERD (gastroesophageal reflux disease), Hyperlipidemia, Hypertension, Insomnia, Platelets decreased (HCC), Pneumonia, and Pre-diabetes.  He presents to the office today for an acute visit. His symptoms have been present for about 5 days. Symptoms include that of a productive cough.  He denies fevers, chills, shortness of breath, or wheezing.  He has been using alka selter plus which helps his symptoms to some degree.   Review of Systems See HPI   Past Medical History:  Diagnosis Date   Allergy    Anxiety    Arthritis    BPH (benign prostatic hyperplasia)    with elevated PSA, sees Dr. Joie Narrow, benign   Cataract    Elevated PSA    sees Dr. Joie Narrow    GERD (gastroesophageal reflux disease)    Hyperlipidemia    Hypertension    Insomnia    Platelets decreased (HCC)    Pneumonia    walking PNA   Pre-diabetes    No meds    Social History   Socioeconomic History   Marital status: Widowed    Spouse name: Not on file   Number of children: Not on file   Years of education: Not on file   Highest education level: Bachelor's degree (e.g., BA, AB, BS)  Occupational History   Not on file  Tobacco Use   Smoking status: Former    Current packs/day: 0.00    Types: Cigarettes    Quit date: 02/09/1974    Years since quitting: 49.4   Smokeless tobacco: Never   Tobacco comments:    quit over 80 yo, absolutely smoked more than 100 cig; referred for [employer]   Vaping Use   Vaping status: Never Used  Substance and Sexual Activity   Alcohol use: Yes    Alcohol/week: 14.0 standard drinks of alcohol    Types: 14 Shots of liquor per week    Comment: drinks a couple shots daily   Drug use: Yes    Types: Marijuana    Comment: occas uses  1 time every other month - this past weekend   Sexual activity: Not Currently  Other Topics Concern   Not on file  Social History Narrative   Lives with spouse who had progressive disease   24/7 care   2 children   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      Social Drivers of Health   Financial Resource Strain: Low Risk  (02/15/2023)   Overall Financial Resource Strain (CARDIA)    Difficulty of Paying Living Expenses: Not hard at all  Food Insecurity: No Food Insecurity (02/15/2023)   Hunger Vital Sign    Worried About Running Out of Food in the Last Year: Never true    Ran Out of Food in the Last Year: Never true  Transportation Needs: No Transportation Needs (02/15/2023)   PRAPARE - Administrator, Civil Service (Medical): No    Lack of Transportation (Non-Medical): No  Physical Activity: Insufficiently Active (02/15/2023)   Exercise Vital Sign    Days of Exercise per Week: 3 days    Minutes of Exercise per Session: 30 min  Stress: Stress Concern Present (02/15/2023)   [employer] of Occupational Health - Occupational Stress Questionnaire  Feeling of Stress : To some extent  Social Connections: Socially Isolated (02/15/2023)   Social Connection and Isolation Panel [NHANES]    Frequency of Communication with Friends and Family: More than three times a week    Frequency of Social Gatherings with Friends and Family: Three times a week    Attends Religious Services: Never    Active Member of Clubs or Organizations: No    Attends Banker Meetings: Never    Marital Status: Widowed  Intimate Partner Violence: Not At Risk (01/11/2023)   Humiliation, Afraid, Rape, and Kick questionnaire    Fear of Current or Ex-Partner: No    Emotionally Abused: No    Physically Abused: No    Sexually Abused: No    Past Surgical History:  Procedure Laterality Date   CHOLECYSTECTOMY N/A 01/10/2014   Procedure: LAPAROSCOPIC CHOLECYSTECTOMY WITH INTRAOPERATIVE CHOLANGIOGRAM;   Surgeon: Azucena Bollard, MD;  Location: [facility];  Service: General;  Laterality: N/A;   COLONOSCOPY  08/03/2018   per Dr. Willy Harvest, adenomatous polyps, no repeats needed    IR ANGIOGRAM PELVIS SELECTIVE OR SUPRASELECTIVE  01/13/2023   IR ANGIOGRAM SELECTIVE EACH ADDITIONAL VESSEL  01/13/2023   IR EMBO TUMOR ORGAN ISCHEMIA INFARCT INC GUIDE ROADMAPPING  01/13/2023   IR RADIOLOGIST EVAL & MGMT  11/18/2022   IR US  GUIDE VASC ACCESS LEFT  01/13/2023   IR US  GUIDE VASC ACCESS RIGHT  01/13/2023   JOINT REPLACEMENT     Right hip arthroplasty Dr. Bernard Brick 01/05/17    left knee arthroscopy      LIPOSUCTION  1987   prostate biopsies in 2003 and again 05/2008     per Dr. Joie Narrow. benign   REFRACTIVE SURGERY Bilateral    cataract surgery   repair  of large umblicial hernia     per Dr. Veronia Goon on 01/10/10   right knee arthroscopy     TONSILLECTOMY     TOTAL HIP ARTHROPLASTY Right 01/05/2017   Procedure: RIGHT TOTAL HIP ARTHROPLASTY ANTERIOR APPROACH;  Surgeon: Claiborne Crew, MD;  Location: [facility];  Service: Orthopedics;  Laterality: Right;  70 mins   TOTAL KNEE ARTHROPLASTY Left 07/16/2014   Procedure: LEFT TOTAL KNEE ARTHROPLASTY;  Surgeon: Claiborne Crew, MD;  Location: [facility];  Service: Orthopedics;  Laterality: Left;   TOTAL KNEE ARTHROPLASTY Right 06/16/2022   Procedure: TOTAL KNEE ARTHROPLASTY;  Surgeon: Claiborne Crew, MD;  Location: [facility];  Service: Orthopedics;  Laterality: Right;    Family History  Problem Relation Age of Onset   Alzheimer's disease Other    Colon cancer Neg Hx    Esophageal cancer Neg Hx    Pancreatic cancer Neg Hx    Prostate cancer Neg Hx    Rectal cancer Neg Hx    Stomach cancer Neg Hx     Allergies  Allergen Reactions   Norco [Hydrocodone -Acetaminophen ]     Hallucinations     Current Outpatient Medications on File Prior to Visit  Medication Sig Dispense Refill   ALPRAZolam  (XANAX ) 0.5 MG tablet Take 1 tablet (0.5 mg total) by mouth every 6 (six) hours as  needed for anxiety. 120 tablet 5   atorvastatin  (LIPITOR) 20 MG tablet Take 1 tablet (20 mg total) by mouth daily. 90 tablet 3   latanoprost  (XALATAN ) 0.005 % ophthalmic solution SMARTSIG:In Eye(s)     lisinopril -hydrochlorothiazide  (ZESTORETIC ) 10-12.5 MG tablet Take 1 tablet by mouth daily. 90 tablet 1   omeprazole  (PRILOSEC ) 40 MG capsule TAKE ONE CAPSULE BY MOUTH TWICE  DAILY 180 capsule 0   Syringe/Needle, Disp, (SYRINGE 3CC/22GX1-1/2") 22G X 1-1/2" 3 ML MISC 1 Application by Does not apply route every 7 (seven) days. 50 each 0   tadalafil  (CIALIS ) 20 MG tablet Take 1 tablet (20 mg total) by mouth as needed for erectile dysfunction. 30 tablet 11   tamsulosin  (FLOMAX ) 0.4 MG CAPS capsule Take 2 capsules (0.8 mg total) by mouth daily. 180 capsule 3   temazepam  (RESTORIL ) 30 MG capsule TAKE ONE CAPSULE BY MOUTH AT BEDTIME AS NEEDED FOR SLEEP 30 capsule 5   testosterone  cypionate (DEPOTESTOSTERONE CYPIONATE) 200 MG/ML injection Inject 1 mL (200 mg total) into the muscle every 7 (seven) days. 10 mL 2   Current Facility-Administered Medications on File Prior to Visit  Medication Dose Route Frequency Provider Last Rate Last Admin   lidocaine  (XYLOCAINE ) 2 % jelly 1 Application  1 Application Topical Once Turpin, Pamela, PA-C        BP (!) 150/70   Pulse 70   Temp 97.9 F (36.6 C) (Oral)   Ht 6' (1.829 m)   Wt 227 lb (103 kg)   SpO2 97%   BMI 30.79 kg/m       Objective:   Physical Exam Vitals and nursing note reviewed.  Constitutional:      Appearance: Normal appearance.  Cardiovascular:     Rate and Rhythm: Normal rate and regular rhythm.     Pulses: Normal pulses.     Heart sounds: Normal heart sounds.  Pulmonary:     Effort: Pulmonary effort is normal.     Breath sounds: Wheezing (trace) present.  Musculoskeletal:        General: Normal range of motion.  Skin:    General: Skin is warm and dry.  Neurological:     General: No focal deficit present.     Mental Status: He is  alert and oriented to person, place, and time.  Psychiatric:        Mood and Affect: Mood normal.        Behavior: Behavior normal.        Thought Content: Thought content normal.        Judgment: Judgment normal.       Assessment & Plan:  1. Upper respiratory tract infection, unspecified type (Primary) - Possible bronchitis. Will send in Prednisone  and Augmentin   - Follow up if not resolving in the next 3-4 days  - predniSONE  (DELTASONE ) 10 MG tablet; 40 mg x 3 days, 20 mg x 3 days, 10 mg x 3 days  Dispense: 21 tablet; Refill: 0 - amoxicillin -clavulanate (AUGMENTIN ) 875-125 MG tablet; Take 1 tablet by mouth 2 (two) times daily.  Dispense: 20 tablet; Refill: 0  2. Primary hypertension - Elevated today. Monitor at home and follow up with PCP if not at goal    Alto Atta, NP

## 2023-07-10 ENCOUNTER — Other Ambulatory Visit: Payer: Self-pay | Admitting: Family Medicine

## 2023-07-10 ENCOUNTER — Other Ambulatory Visit: Payer: Self-pay | Admitting: Adult Health

## 2023-07-10 DIAGNOSIS — J069 Acute upper respiratory infection, unspecified: Secondary | ICD-10-CM

## 2023-07-10 DIAGNOSIS — E785 Hyperlipidemia, unspecified: Secondary | ICD-10-CM

## 2023-08-03 DIAGNOSIS — E291 Testicular hypofunction: Secondary | ICD-10-CM | POA: Diagnosis not present

## 2023-08-03 DIAGNOSIS — R972 Elevated prostate specific antigen [PSA]: Secondary | ICD-10-CM | POA: Diagnosis not present

## 2023-08-06 NOTE — Progress Notes (Signed)
 [facility] Quality Team Note  Name: Benjamin [name] Date of Birth: 1940-08-05 MRN: [id number] Date: 08/06/2023  [facility] Quality Team has reviewed this patient's chart, please see recommendations below:  [facility] Quality Other; (CHART REVIEWED FOR KIDNEY HEALTH EVALUATION IN DIABETICS. PATIENT NEEDS URINE MICROALBUMIN/CREATININE RATIO COMPLETED FOR GAP CLOSURE)

## 2023-08-10 DIAGNOSIS — R338 Other retention of urine: Secondary | ICD-10-CM | POA: Diagnosis not present

## 2023-08-10 DIAGNOSIS — E291 Testicular hypofunction: Secondary | ICD-10-CM | POA: Diagnosis not present

## 2023-08-10 DIAGNOSIS — N5201 Erectile dysfunction due to arterial insufficiency: Secondary | ICD-10-CM | POA: Diagnosis not present

## 2023-08-10 DIAGNOSIS — N401 Enlarged prostate with lower urinary tract symptoms: Secondary | ICD-10-CM | POA: Diagnosis not present

## 2023-08-23 DIAGNOSIS — N401 Enlarged prostate with lower urinary tract symptoms: Secondary | ICD-10-CM | POA: Diagnosis not present

## 2023-08-23 DIAGNOSIS — R338 Other retention of urine: Secondary | ICD-10-CM | POA: Diagnosis not present

## 2023-08-25 DIAGNOSIS — R338 Other retention of urine: Secondary | ICD-10-CM | POA: Diagnosis not present

## 2023-08-26 ENCOUNTER — Ambulatory Visit: Payer: Self-pay

## 2023-08-26 NOTE — Telephone Encounter (Signed)
 FYI Only or Action Required?: Action required by provider: request for appointment.  Patient was last seen in primary care on 07/06/2023 by Merna Huxley, NP.  Called Nurse Triage reporting Leg Pain.  Symptoms began several weeks ago.  Interventions attempted: OTC medications: aleve .  Symptoms are: gradually worsening.  Triage Disposition: See PCP When Office is Open (Within 3 Days)  Patient/caregiver understands and will follow disposition?: YesCopied from CRM [id number]. Topic: Clinical - Red Word Triage >> Aug 26, 2023  1:24 PM Berneda FALCON wrote: Red Word that prompted transfer to Nurse Triage: Leg pain-leg spasms mostly at night. Scheduled an appt for tomorrow with Dr. Johnny but patient requested to speak to the nurse for clinical advice. Reason for Disposition  [1] MODERATE pain (e.g., interferes with normal activities, limping) AND [2] present > 3 days  Answer Assessment - Initial Assessment Questions 1. ONSET: When did the pain start?      Several weeks  2. LOCATION: Where is the pain located?      Mostly in left side of groin 3. PAIN: How bad is the pain?    (Scale 1-10; or mild, moderate, severe)     At it's worst, 9   5. CAUSE: What do you think is causing the leg pain?     Hip issues  6. OTHER SYMPTOMS: Do you have any other symptoms? (e.g., chest pain, back pain, breathing difficulty, swelling, rash, fever, numbness, weakness)     Denies all    Pain from hips is causing leg pain. Pt has ortho appt next month. Pt set up appt for himself for tomorrow but is asking if muscle relaxer can be prescribed to get him through night. Pt having a hard time walking. OTC aleve  doesn't touch it. Pease advise.  Protocols used: Leg Pain-A-AH

## 2023-08-27 ENCOUNTER — Encounter: Payer: Self-pay | Admitting: Family Medicine

## 2023-08-27 ENCOUNTER — Ambulatory Visit (INDEPENDENT_AMBULATORY_CARE_PROVIDER_SITE_OTHER): Admitting: Family Medicine

## 2023-08-27 VITALS — BP 122/64 | HR 81 | Temp 97.9°F | Wt 228.0 lb

## 2023-08-27 DIAGNOSIS — R339 Retention of urine, unspecified: Secondary | ICD-10-CM | POA: Diagnosis not present

## 2023-08-27 DIAGNOSIS — M62838 Other muscle spasm: Secondary | ICD-10-CM | POA: Diagnosis not present

## 2023-08-27 MED ORDER — METHOCARBAMOL 500 MG PO TABS: 500.0000 mg | ORAL_TABLET | Freq: Four times a day (QID) | ORAL | 2 refills | Status: DC | PRN

## 2023-08-27 NOTE — Telephone Encounter (Signed)
 Pt was seen this morning by Dr Johnny

## 2023-08-27 NOTE — Progress Notes (Signed)
   Subjective:    Patient ID: Carol Loftin, male    DOB: 1940-03-06, 83 y.o.   MRN: [id number]  HPI Here asking for refills on a muscle relaxer. He is S/P replacement surgeries for both knees and the right hip. Now for the past 6 months the left hip is giving him problems. He has pain in the hip and he also has muscle spasms in both thighs. He took the past of the Methocarbamol  that his orthopedist, Dr. Ernie, had given him and these helped a  great deal.    Review of Systems  Constitutional: Negative.   Respiratory: Negative.    Cardiovascular: Negative.   Musculoskeletal:  Positive for arthralgias.       Objective:   Physical Exam Constitutional:      Appearance: Normal appearance.  Cardiovascular:     Rate and Rhythm: Normal rate and regular rhythm.     Pulses: Normal pulses.     Heart sounds: Normal heart sounds.  Pulmonary:     Effort: Pulmonary effort is normal.     Breath sounds: Normal breath sounds.  Neurological:     Mental Status: He is alert.           Assessment & Plan:  Left hip pain and leg spasms. We will refill Methocarbamol  500 mg to use as needed. He is schedule to see the PA in Dr. Lyndle office on 09-20-23. Garnette Olmsted, MD

## 2023-09-06 ENCOUNTER — Ambulatory Visit: Admitting: Urology

## 2023-09-06 VITALS — BP 177/73 | HR 57 | Ht 72.0 in

## 2023-09-06 DIAGNOSIS — N401 Enlarged prostate with lower urinary tract symptoms: Secondary | ICD-10-CM

## 2023-09-06 DIAGNOSIS — N138 Other obstructive and reflux uropathy: Secondary | ICD-10-CM | POA: Diagnosis not present

## 2023-09-06 LAB — MICROSCOPIC EXAMINATION

## 2023-09-06 LAB — URINALYSIS, COMPLETE
Bilirubin, UA: NEGATIVE
Glucose, UA: NEGATIVE
Ketones, UA: NEGATIVE
Leukocytes,UA: NEGATIVE
Nitrite, UA: NEGATIVE
Protein,UA: NEGATIVE
RBC, UA: NEGATIVE
Specific Gravity, UA: 1.025 (ref 1.005–1.030)
Urobilinogen, Ur: 0.2 mg/dL (ref 0.2–1.0)
pH, UA: 6 (ref 5.0–7.5)

## 2023-09-06 NOTE — Progress Notes (Signed)
 09/06/23 4:10 PM   Benjamin [name] 07/06/1940 [id number]  CC: BPH and incomplete emptying, elevated PSA  HPI: 83 year old relatively healthy male with long history of BPH with urinary symptoms, and elevated PSA.  He is been followed by Dr. Carolee at [facility] and was referred for consideration of HOLEP.  He had a 200 g prostate on MRI, biopsy was negative, he ultimately underwent prostate artery embolization on the left side in December 2024.  He was on CIC for about a week afterwards.  He has had worsening urinary symptoms over the last few months, with elevated bladder scans ranging from 300-548ml, ultimately culminating in retention requiring catheterization 2-3 times daily.  He is interested in options to resume spontaneous voiding.  He also has ED, has an appointment to start Trimix with [facility].  He is on testosterone  as well.   PMH: Past Medical History:  Diagnosis Date   Allergy    Anxiety    Arthritis    BPH (benign prostatic hyperplasia)    with elevated PSA, sees Dr. Matilda, benign   Cataract    Elevated PSA    sees Dr. Matilda    GERD (gastroesophageal reflux disease)    Hyperlipidemia    Hypertension    Insomnia    Platelets decreased (HCC)    Pneumonia    walking PNA   Pre-diabetes    No meds    Surgical History: Past Surgical History:  Procedure Laterality Date   CARDIAC VALVE REPLACEMENT     CHOLECYSTECTOMY N/A 01/10/2014   Procedure: LAPAROSCOPIC CHOLECYSTECTOMY WITH INTRAOPERATIVE CHOLANGIOGRAM;  Surgeon: Donnice KATHEE Lunger, MD;  Location: [facility];  Service: General;  Laterality: N/A;   COLONOSCOPY  08/03/2018   per Dr. Avram, adenomatous polyps, no repeats needed    EMBOLIZATION  02/2023   of prostate per Dr. Carolee   IR ANGIOGRAM PELVIS SELECTIVE OR SUPRASELECTIVE  01/13/2023   IR ANGIOGRAM SELECTIVE EACH ADDITIONAL VESSEL  01/13/2023   IR EMBO TUMOR ORGAN ISCHEMIA INFARCT INC GUIDE ROADMAPPING  01/13/2023   IR RADIOLOGIST  EVAL & MGMT  11/18/2022   IR US  GUIDE VASC ACCESS LEFT  01/13/2023   IR US  GUIDE VASC ACCESS RIGHT  01/13/2023   JOINT REPLACEMENT     Right hip arthroplasty Dr. Ernie 01/05/17    left knee arthroscopy      LIPOSUCTION  1987   prostate biopsies in 2003 and again 05/2008     per Dr. Matilda. benign   REFRACTIVE SURGERY Bilateral    cataract surgery   repair  of large umblicial hernia     per Dr. Adina Lunger on 01/10/10   right knee arthroscopy     TONSILLECTOMY     TOTAL HIP ARTHROPLASTY Right 01/05/2017   Procedure: RIGHT TOTAL HIP ARTHROPLASTY ANTERIOR APPROACH;  Surgeon: Ernie Donnice, MD;  Location: [facility];  Service: Orthopedics;  Laterality: Right;  70 mins   TOTAL KNEE ARTHROPLASTY Left 07/16/2014   Procedure: LEFT TOTAL KNEE ARTHROPLASTY;  Surgeon: Donnice Ernie, MD;  Location: [facility];  Service: Orthopedics;  Laterality: Left;   TOTAL KNEE ARTHROPLASTY Right 06/16/2022   Procedure: TOTAL KNEE ARTHROPLASTY;  Surgeon: Ernie Donnice, MD;  Location: [facility];  Service: Orthopedics;  Laterality: Right;     Family History: Family History  Problem Relation Age of Onset   Alzheimer's disease Other    Colon cancer Neg Hx    Esophageal cancer Neg Hx    Pancreatic cancer Neg Hx    Prostate  cancer Neg Hx    Rectal cancer Neg Hx    Stomach cancer Neg Hx     Social History:  reports that he quit smoking about 49 years ago. His smoking use included cigarettes. He has never used smokeless tobacco. He reports current alcohol use of about 14.0 standard drinks of alcohol per week. He reports current drug use. Drug: Marijuana.  Physical Exam: BP (!) 177/73 (BP Location: Left Arm, Patient Position: Sitting, Cuff Size: Large)   Pulse (!) 57   Ht 6' (1.829 m)   BMI 30.92 kg/m    Constitutional:  Alert and oriented, No acute distress. Cardiovascular: No clubbing, cyanosis, or edema. Respiratory: Normal respiratory effort, no increased work of breathing. GI: Abdomen is soft, nontender,  nondistended, no abdominal masses  Assessment & Plan:   83 year old male with 200 g prostate, failed prostate artery embolization December 2024, bladder currently managed with intermittent catheterization and interested in options to resume spontaneous voiding.  We discussed the risks and benefits of HoLEP at length.  The procedure requires general anesthesia and takes 1 to 2 hours, and a holmium laser is used to enucleate the prostate and push this tissue into the bladder.  A morcellator is then used to remove this tissue, which is sent for pathology.  The vast majority(>95%) of patients are able to discharge the same day with a catheter in place for 2 to 3 days, and will follow-up in clinic for a voiding trial.  We specifically discussed the risks of bleeding, infection, retrograde ejaculation, temporary urgency and urge incontinence, very low risk of long-term incontinence, urethral stricture/bladder neck contracture, pathologic evaluation of prostate tissue and possible detection of prostate cancer or other malignancy, and possible need for additional procedures.  Schedule HOLEP   Redell Burnet, MD 09/06/2023  [facility] [address] [place], [place] [address] [phone]

## 2023-09-06 NOTE — Patient Instructions (Signed)

## 2023-09-07 ENCOUNTER — Other Ambulatory Visit: Payer: Self-pay

## 2023-09-07 DIAGNOSIS — N401 Enlarged prostate with lower urinary tract symptoms: Secondary | ICD-10-CM

## 2023-09-07 NOTE — Progress Notes (Signed)
 Patient prefers to schedule next week, will follow up with him on 8/4.

## 2023-09-07 NOTE — Progress Notes (Signed)
 Surgical Physician Order Form [facility]  * Scheduling expectation : Next Available  *Length of Case: 2 hours  *Clearance needed: no  *Anticoagulation Instructions: Hold all anticoagulants  *Aspirin  Instructions: Hold Aspirin   *Post-op visit Date/Instructions:  1-3 day cath removal, 3-4 month MD PVR  *Diagnosis: BPH w/urinary obstruction  *Procedure:  HOLEP (47350)   Additional orders: N/A  -Admit type: OUTpatient  -Anesthesia: General  -VTE Prophylaxis Standing Order SCD's       Other:   -Standing Lab Orders Per Anesthesia    Lab other: UA&Urine Culture sent 7/28  -Standing Test orders EKG/Chest x-ray per Anesthesia       Test other:   - Medications:  Ancef  2gm IV  -Other orders:  N/A

## 2023-09-08 DIAGNOSIS — N5201 Erectile dysfunction due to arterial insufficiency: Secondary | ICD-10-CM | POA: Diagnosis not present

## 2023-09-08 DIAGNOSIS — Z96641 Presence of right artificial hip joint: Secondary | ICD-10-CM | POA: Diagnosis not present

## 2023-09-08 DIAGNOSIS — Z96653 Presence of artificial knee joint, bilateral: Secondary | ICD-10-CM | POA: Diagnosis not present

## 2023-09-08 DIAGNOSIS — M1612 Unilateral primary osteoarthritis, left hip: Secondary | ICD-10-CM | POA: Diagnosis not present

## 2023-09-10 DIAGNOSIS — R339 Retention of urine, unspecified: Secondary | ICD-10-CM | POA: Diagnosis not present

## 2023-09-11 DIAGNOSIS — R339 Retention of urine, unspecified: Secondary | ICD-10-CM | POA: Diagnosis not present

## 2023-09-13 NOTE — Progress Notes (Signed)
 Patient is going to be having a hip replacement on 9/2. Would like to wait until after to schedule. Asked to be placed on Dr. Marval October schedule to re-discuss surgery. Will remove from Surgery Inbasket. Patient encouraged to call with any questions or concerns in the interim.

## 2023-09-14 DIAGNOSIS — K080 Exfoliation of teeth due to systemic causes: Secondary | ICD-10-CM | POA: Diagnosis not present

## 2023-09-20 ENCOUNTER — Other Ambulatory Visit: Payer: Self-pay | Admitting: Family Medicine

## 2023-09-22 NOTE — Progress Notes (Signed)
 Sent message, via epic in basket, requesting orders in epic from Careers adviser.

## 2023-09-28 DIAGNOSIS — M5416 Radiculopathy, lumbar region: Secondary | ICD-10-CM | POA: Diagnosis not present

## 2023-09-28 NOTE — Patient Instructions (Signed)
 SURGICAL WAITING ROOM VISITATION Patients having surgery or a procedure may have no more than 2 support people in the waiting area - these visitors may rotate in the visitor waiting room.   If the patient needs to stay at the hospital during part of their recovery, the visitor guidelines for inpatient rooms apply.  PRE-OP VISITATION  Pre-op nurse will coordinate an appropriate time for 1 support person to accompany the patient in pre-op.  This support person may not rotate.  This visitor will be contacted when the time is appropriate for the visitor to come back in the pre-op area.  Please refer to the [facility] website for the visitor guidelines for Inpatients (after your surgery is over and you are in a regular room).  You are not required to quarantine at this time prior to your surgery. However, you must do this: Hand Hygiene often Do NOT share personal items Notify your provider if you are in close contact with someone who has COVID or you develop fever 100.4 or greater, new onset of sneezing, cough, sore throat, shortness of breath or body aches.  If you test positive for Covid or have been in contact with anyone that has tested positive in the last 10 days please notify you surgeon.    Your procedure is scheduled on:  TUESDAY  October 12, 2023  Report to [facility] Main Entrance: [address] entrance where the [employer] is available.   Report to admitting at: 09:00 AM  Call this number if you have any questions or problems the morning of surgery [phone]  Do not eat food after Midnight the night prior to your surgery/procedure.  After Midnight you may have the following liquids until 08:30 AM DAY OF SURGERY  Clear Liquid Diet Water  Black Coffee (sugar ok, NO MILK/CREAM OR CREAMERS)  Tea (sugar ok, NO MILK/CREAM OR CREAMERS) regular and decaf                             Plain Jell-O  with no fruit (NO RED)                                           Fruit ices  (not with fruit pulp, NO RED)                                     Popsicles (NO RED)                                                                  Juice: NO CITRUS JUICES: only apple, WHITE grape, WHITE cranberry Sports drinks like Gatorade or Powerade (NO RED)               FOLLOW ANY ADDITIONAL PRE OP INSTRUCTIONS YOU RECEIVED FROM YOUR SURGEON'S OFFICE!!!   Oral Hygiene is also important to reduce your risk of infection.        Remember - BRUSH YOUR TEETH THE MORNING OF SURGERY WITH YOUR REGULAR TOOTHPASTE  Do NOT smoke after Midnight the night  before surgery.  STOP TAKING all Vitamins, Herbs and supplements 1 week before your surgery.   Take ONLY these medicines the morning of surgery with A SIP OF WATER : Omeprazole  and alprazolam  if needed. You may use your Eye drops if needed.   DO NOT TAKE Lisinopril  / HCTZ the morning of your surgery.    You may not have any metal on your body including jewelry, and body piercing  Do not wear lotions, powders, cologne, or deodorant  Men may shave face and neck.  Contacts, Hearing Aids, dentures or bridgework may not be worn into surgery. DENTURES WILL BE REMOVED PRIOR TO SURGERY PLEASE DO NOT APPLY Poly grip OR ADHESIVES!!!  Patients discharged on the day of surgery will not be allowed to drive home.  Someone NEEDS to stay with you for the first 24 hours after anesthesia.  Do not bring your home medications to the hospital. The Pharmacy will dispense medications listed on your medication list to you during your admission in the Hospital.  Please read over the following fact sheets you were given: IF YOU HAVE QUESTIONS ABOUT YOUR PRE-OP INSTRUCTIONS, PLEASE CALL [phone].     Pre-operative 5 CHG Bath Instructions   You can play a key role in reducing the risk of infection after surgery. Your skin needs to be as free of germs as possible. You can reduce the number of germs on your skin by washing with CHG (chlorhexidine   gluconate) soap before surgery. CHG is an antiseptic soap that kills germs and continues to kill germs even after washing.   DO NOT use if you have an allergy to chlorhexidine /CHG or antibacterial soaps. If your skin becomes reddened or irritated, stop using the CHG and notify one of our RNs at [phone]  Please shower with the CHG soap starting 4 days before surgery using the following schedule: START SHOWERS ON                  FRIDAY 10-08-2023                                                                                                                                                                              Please keep in mind the following:  DO NOT shave, including legs and underarms, starting the day of your first shower.   You may shave your face at any point before/day of surgery.   Place clean sheets on your bed the day you start using CHG soap. Use a clean washcloth (not used since being washed) for each shower. DO NOT sleep with pets once you start using the CHG.   CHG Shower Instructions:  If you choose to wash your hair and private  area, wash first with your normal shampoo/soap.  After you use shampoo/soap, rinse your hair and body thoroughly to remove shampoo/soap residue.  Turn the water  OFF and apply about 3 tablespoons (45 ml) of CHG soap to a CLEAN washcloth.  Apply CHG soap ONLY FROM YOUR NECK DOWN TO YOUR TOES (washing for 3-5 minutes)  DO NOT use CHG soap on face, private areas, open wounds, or sores.  Pay special attention to the area where your surgery is being performed.  If you are having back surgery, having someone wash your back for you may be helpful.  Wait 2 minutes after CHG soap is applied, then you may rinse off the CHG soap.  Pat dry with a clean towel  Put on clean clothes/pajamas   If you choose to wear lotion, please use ONLY the CHG-compatible lotions on the back of this paper.     Additional instructions for the day of surgery: DO NOT  APPLY any lotions, deodorants, cologne, or perfumes.   Put on clean/comfortable clothes.  Brush your teeth.  Ask your nurse before applying any prescription medications to the skin.      CHG Compatible Lotions   Aveeno Moisturizing lotion  Cetaphil Moisturizing Cream  Cetaphil Moisturizing Lotion  Clairol Herbal Essence Moisturizing Lotion, Dry Skin  Clairol Herbal Essence Moisturizing Lotion, Extra Dry Skin  Clairol Herbal Essence Moisturizing Lotion, Normal Skin  Curel Age Defying Therapeutic Moisturizing Lotion with Alpha Hydroxy  Curel Extreme Care Body Lotion  Curel Soothing Hands Moisturizing Hand Lotion  Curel Therapeutic Moisturizing Cream, Fragrance-Free  Curel Therapeutic Moisturizing Lotion, Fragrance-Free  Curel Therapeutic Moisturizing Lotion, Original Formula  Eucerin Daily Replenishing Lotion  Eucerin Dry Skin Therapy Plus Alpha Hydroxy Crme  Eucerin Dry Skin Therapy Plus Alpha Hydroxy Lotion  Eucerin Original Crme  Eucerin Original Lotion  Eucerin Plus Crme Eucerin Plus Lotion  Eucerin TriLipid Replenishing Lotion  Keri Anti-Bacterial Hand Lotion  Keri Deep Conditioning Original Lotion Dry Skin Formula Softly Scented  Keri Deep Conditioning Original Lotion, Fragrance Free Sensitive Skin Formula  Keri Lotion Fast Absorbing Fragrance Free Sensitive Skin Formula  Keri Lotion Fast Absorbing Softly Scented Dry Skin Formula  Keri Original Lotion  Keri Skin Renewal Lotion Keri Silky Smooth Lotion  Keri Silky Smooth Sensitive Skin Lotion  Nivea Body Creamy Conditioning Oil  Nivea Body Extra Enriched Lotion  Nivea Body Original Lotion  Nivea Body Sheer Moisturizing Lotion Nivea Crme  Nivea Skin Firming Lotion  NutraDerm 30 Skin Lotion  NutraDerm Skin Lotion  NutraDerm Therapeutic Skin Cream  NutraDerm Therapeutic Skin Lotion  ProShield Protective Hand Cream  Provon moisturizing lotion   FAILURE TO FOLLOW THESE INSTRUCTIONS MAY RESULT IN THE CANCELLATION  OF YOUR SURGERY  PATIENT SIGNATURE_________________________________  NURSE SIGNATURE__________________________________  ________________________________________________________________________       Nasario Exon    An incentive spirometer is a tool that can help keep your lungs clear and active. This tool measures how well you are filling your lungs with each breath. Taking long deep breaths may help reverse or decrease the chance of developing breathing (pulmonary) problems (especially infection) following: A long period of time when you are unable to move or be active. BEFORE THE PROCEDURE  If the spirometer includes an indicator to show your best effort, your nurse or respiratory therapist will set it to a desired goal. If possible, sit up straight or lean slightly forward. Try not to slouch. Hold the incentive spirometer in an upright position. INSTRUCTIONS FOR USE  Sit on the  edge of your bed if possible, or sit up as far as you can in bed or on a chair. Hold the incentive spirometer in an upright position. Breathe out normally. Place the mouthpiece in your mouth and seal your lips tightly around it. Breathe in slowly and as deeply as possible, raising the piston or the ball toward the top of the column. Hold your breath for 3-5 seconds or for as long as possible. Allow the piston or ball to fall to the bottom of the column. Remove the mouthpiece from your mouth and breathe out normally. Rest for a few seconds and repeat Steps 1 through 7 at least 10 times every 1-2 hours when you are awake. Take your time and take a few normal breaths between deep breaths. The spirometer may include an indicator to show your best effort. Use the indicator as a goal to work toward during each repetition. After each set of 10 deep breaths, practice coughing to be sure your lungs are clear. If you have an incision (the cut made at the time of surgery), support your incision when coughing by  placing a pillow or rolled up towels firmly against it. Once you are able to get out of bed, walk around indoors and cough well. You may stop using the incentive spirometer when instructed by your caregiver.  RISKS AND COMPLICATIONS Take your time so you do not get dizzy or light-headed. If you are in pain, you may need to take or ask for pain medication before doing incentive spirometry. It is harder to take a deep breath if you are having pain. AFTER USE Rest and breathe slowly and easily. It can be helpful to keep track of a log of your progress. Your caregiver can provide you with a simple table to help with this. If you are using the spirometer at home, follow these instructions: SEEK MEDICAL CARE IF:  You are having difficultly using the spirometer. You have trouble using the spirometer as often as instructed. Your pain medication is not giving enough relief while using the spirometer. You develop fever of 100.5 F (38.1 C) or higher.                                                                                                    SEEK IMMEDIATE MEDICAL CARE IF:  You cough up bloody sputum that had not been present before. You develop fever of 102 F (38.9 C) or greater. You develop worsening pain at or near the incision site. MAKE SURE YOU:  Understand these instructions. Will watch your condition. Will get help right away if you are not doing well or get worse. Document Released: 06/08/2006 Document Revised: 04/20/2011 Document Reviewed: 08/09/2006 [facility] Patient Information 2014 [place], [place].       If you would like to see a video about joint replacement:   [email or web address]

## 2023-09-28 NOTE — Progress Notes (Incomplete)
 COVID Vaccine received:  []  No [x]  Yes Date of any COVID positive Test in last 33 days:None  PCP - Garnette Olmsted, MD  Cardiologist - none  Chest x-ray - 12-03-2022  2v  CEW-  [facility] EKG -  05-2022  will repeat Stress Test -  ECHO -  Cardiac Cath -  CT Coronary Calcium  score:   Pacemaker / ICD device [x]  No []  Yes   Spinal Cord Stimulator:[x]  No []  Yes       History of Sleep Apnea? [x]  No []  Yes   CPAP used?- [x]  No []  Yes    Patient has: []  NO Hx DM   [x]  Pre-DM   []  DM1  []   DM2 Does the patient monitor blood sugar?   []  N/A   [x]  No []  Yes  Last A1c was: 5.9 on   02-26-23    Blood Thinner / Instructions:   none Aspirin  Instructions:  None  ERAS Protocol Ordered: []  No  [x]  Yes  per case special needs note PRE-SURGERY []  ENSURE  []  G2   [x]  No Drink Ordered  Patient is to be NPO after:   NO ORDERS   IB msg / VM sent   Dental hx: []  Dentures:  [x]  N/A      []  Bridge or Partial:                   []  Loose or Damaged teeth:   Comments: Patient was given the 5 CHG shower / bath instructions for THA surgery along with 2 bottles of the CHG soap. Patient will start this on:   09-06-23  Thursday           Activity level: Able to walk up 2 flights of stairs without becoming significantly short of breath or having chest pain?  []  No   [x]    Yes  Patient can perform ADLs without assistance. []  No   [x]   Yes  Anesthesia review: HTN, Pre-DM no meds, Depression / anxiety,   last 2 EKGs show - 1 AVB,   Patient does SELF-CATHETERIZATIONs.  Patient denies any S&S of respiratory illness or Covid - no shortness of breath, fever, cough or chest pain at PAT appointment.  Patient verbalized understanding and agreement to the Pre-Surgical Instructions that were given to them at this PAT appointment. Patient was also educated of the need to review these PAT instructions again prior to his surgery.I reviewed the appropriate phone numbers to call if they have any and questions or concerns.

## 2023-09-29 ENCOUNTER — Other Ambulatory Visit: Payer: Self-pay

## 2023-09-29 ENCOUNTER — Encounter (HOSPITAL_COMMUNITY)
Admission: RE | Admit: 2023-09-29 | Discharge: 2023-09-29 | Disposition: A | Discharge status: Home / Self Care | Source: Ambulatory Visit | Attending: Orthopedic Surgery | Admitting: Orthopedic Surgery

## 2023-09-29 ENCOUNTER — Encounter (HOSPITAL_COMMUNITY): Payer: Self-pay

## 2023-09-29 VITALS — BP 158/70 | HR 74 | Temp 97.8°F | Resp 16 | Ht 71.5 in | Wt 231.0 lb

## 2023-09-29 DIAGNOSIS — F1090 Alcohol use, unspecified, uncomplicated: Secondary | ICD-10-CM | POA: Diagnosis not present

## 2023-09-29 DIAGNOSIS — Z01818 Encounter for other preprocedural examination: Secondary | ICD-10-CM | POA: Diagnosis not present

## 2023-09-29 DIAGNOSIS — M1612 Unilateral primary osteoarthritis, left hip: Secondary | ICD-10-CM | POA: Diagnosis not present

## 2023-09-29 DIAGNOSIS — R7303 Prediabetes: Secondary | ICD-10-CM | POA: Insufficient documentation

## 2023-09-29 DIAGNOSIS — I10 Essential (primary) hypertension: Secondary | ICD-10-CM | POA: Insufficient documentation

## 2023-09-29 HISTORY — DX: Other specified health status: Z78.9

## 2023-09-29 LAB — COMPREHENSIVE METABOLIC PANEL WITH GFR
ALT: 18 U/L (ref 0–44)
AST: 20 U/L (ref 15–41)
Albumin: 3.9 g/dL (ref 3.5–5.0)
Alkaline Phosphatase: 54 U/L (ref 38–126)
Anion gap: 8 (ref 5–15)
BUN: 17 mg/dL (ref 8–23)
CO2: 27 mmol/L (ref 22–32)
Calcium: 9.3 mg/dL (ref 8.9–10.3)
Chloride: 102 mmol/L (ref 98–111)
Creatinine, Ser: 1.18 mg/dL (ref 0.61–1.24)
GFR, Estimated: >60 mL/min (ref >60)
Glucose, Bld: 143 mg/dL — ABNORMAL HIGH (ref 70–99)
Potassium: 4.1 mmol/L (ref 3.5–5.1)
Sodium: 137 mmol/L (ref 135–145)
Total Bilirubin: 1.2 mg/dL (ref 0.0–1.2)
Total Protein: 6.7 g/dL (ref 6.5–8.1)

## 2023-09-29 LAB — CBC
HCT: 52.2 % — ABNORMAL HIGH (ref 39.0–52.0)
Hemoglobin: 16.6 g/dL (ref 13.0–17.0)
MCH: 30 pg (ref 26.0–34.0)
MCHC: 31.8 g/dL (ref 30.0–36.0)
MCV: 94.4 fL (ref 80.0–100.0)
Platelets: 124 K/uL — ABNORMAL LOW (ref 150–400)
RBC: 5.53 MIL/uL (ref 4.22–5.81)
RDW: 14.9 % (ref 11.5–15.5)
WBC: 5.3 K/uL (ref 4.0–10.5)
nRBC: 0 % (ref 0.0–0.2)

## 2023-09-29 LAB — TYPE AND SCREEN
ABO/RH(D): A POS
Antibody Screen: NEGATIVE

## 2023-09-29 LAB — SURGICAL PCR SCREEN
MRSA, PCR: NEGATIVE
Staphylococcus aureus: NEGATIVE

## 2023-10-12 ENCOUNTER — Ambulatory Visit (HOSPITAL_COMMUNITY)

## 2023-10-12 ENCOUNTER — Other Ambulatory Visit: Payer: Self-pay

## 2023-10-12 ENCOUNTER — Encounter (HOSPITAL_COMMUNITY): Payer: Self-pay | Admitting: Orthopedic Surgery

## 2023-10-12 ENCOUNTER — Ambulatory Visit (HOSPITAL_COMMUNITY)
Admission: RE | Admit: 2023-10-12 | Discharge: 2023-10-12 | Disposition: A | Discharge status: Home / Self Care | Attending: Orthopedic Surgery | Admitting: Orthopedic Surgery

## 2023-10-12 ENCOUNTER — Encounter (HOSPITAL_COMMUNITY)
Admission: RE | Disposition: A | Discharge status: Home / Self Care | Payer: Self-pay | Source: Home / Self Care | Attending: Orthopedic Surgery

## 2023-10-12 ENCOUNTER — Ambulatory Visit (HOSPITAL_COMMUNITY): Payer: Self-pay | Admitting: Medical

## 2023-10-12 ENCOUNTER — Ambulatory Visit (HOSPITAL_BASED_OUTPATIENT_CLINIC_OR_DEPARTMENT_OTHER): Admitting: Certified Registered"

## 2023-10-12 DIAGNOSIS — F419 Anxiety disorder, unspecified: Secondary | ICD-10-CM | POA: Insufficient documentation

## 2023-10-12 DIAGNOSIS — M1612 Unilateral primary osteoarthritis, left hip: Secondary | ICD-10-CM

## 2023-10-12 DIAGNOSIS — E119 Type 2 diabetes mellitus without complications: Secondary | ICD-10-CM | POA: Insufficient documentation

## 2023-10-12 DIAGNOSIS — Z87891 Personal history of nicotine dependence: Secondary | ICD-10-CM | POA: Diagnosis not present

## 2023-10-12 DIAGNOSIS — I10 Essential (primary) hypertension: Secondary | ICD-10-CM | POA: Insufficient documentation

## 2023-10-12 DIAGNOSIS — E785 Hyperlipidemia, unspecified: Secondary | ICD-10-CM | POA: Insufficient documentation

## 2023-10-12 DIAGNOSIS — F32A Depression, unspecified: Secondary | ICD-10-CM | POA: Insufficient documentation

## 2023-10-12 DIAGNOSIS — Z9181 History of falling: Secondary | ICD-10-CM | POA: Diagnosis not present

## 2023-10-12 DIAGNOSIS — Z79899 Other long term (current) drug therapy: Secondary | ICD-10-CM | POA: Insufficient documentation

## 2023-10-12 DIAGNOSIS — Z96643 Presence of artificial hip joint, bilateral: Secondary | ICD-10-CM | POA: Diagnosis not present

## 2023-10-12 DIAGNOSIS — F418 Other specified anxiety disorders: Secondary | ICD-10-CM

## 2023-10-12 DIAGNOSIS — Z471 Aftercare following joint replacement surgery: Secondary | ICD-10-CM | POA: Diagnosis not present

## 2023-10-12 DIAGNOSIS — K219 Gastro-esophageal reflux disease without esophagitis: Secondary | ICD-10-CM | POA: Diagnosis not present

## 2023-10-12 DIAGNOSIS — Z96642 Presence of left artificial hip joint: Secondary | ICD-10-CM | POA: Diagnosis not present

## 2023-10-12 HISTORY — PX: TOTAL HIP ARTHROPLASTY: SHX124

## 2023-10-12 LAB — ABO/RH: ABO/RH(D): A POS

## 2023-10-12 SURGERY — ARTHROPLASTY, HIP, TOTAL, ANTERIOR APPROACH
Anesthesia: Monitor Anesthesia Care | Site: Hip | Laterality: Left

## 2023-10-12 MED ORDER — LACTATED RINGERS IV BOLUS
500.0000 mL | Freq: Once | INTRAVENOUS | Status: AC
  Administered 2023-10-12: 500 mL via INTRAVENOUS

## 2023-10-12 MED ORDER — ORAL CARE MOUTH RINSE: 15.0000 mL | Freq: Once | OROMUCOSAL | Status: AC

## 2023-10-12 MED ORDER — CELECOXIB 200 MG PO CAPS: 200.0000 mg | ORAL_CAPSULE | Freq: Every day | ORAL | 0 refills | Status: AC

## 2023-10-12 MED ORDER — CEFAZOLIN SODIUM-DEXTROSE 2-4 GM/100ML-% IV SOLN: 2.0000 g | Freq: Four times a day (QID) | INTRAVENOUS | Status: DC

## 2023-10-12 MED ORDER — KETOROLAC TROMETHAMINE 30 MG/ML IJ SOLN
INTRAMUSCULAR | Status: DC | PRN
  Administered 2023-10-12: 61 mL via SURGICAL_CAVITY

## 2023-10-12 MED ORDER — FENTANYL CITRATE (PF) 250 MCG/5ML IJ SOLN
INTRAMUSCULAR | Status: AC
  Filled 2023-10-12: qty 5

## 2023-10-12 MED ORDER — OXYCODONE HCL 5 MG/5ML PO SOLN: 5.0000 mg | Freq: Once | ORAL | Status: DC | PRN

## 2023-10-12 MED ORDER — TRANEXAMIC ACID-NACL 1000-0.7 MG/100ML-% IV SOLN
1000.0000 mg | INTRAVENOUS | Status: AC
  Administered 2023-10-12: 1000 mg via INTRAVENOUS
  Filled 2023-10-12: qty 100

## 2023-10-12 MED ORDER — OXYCODONE HCL 5 MG PO TABS: 5.0000 mg | ORAL_TABLET | Freq: Once | ORAL | Status: DC | PRN

## 2023-10-12 MED ORDER — FENTANYL CITRATE (PF) 250 MCG/5ML IJ SOLN
INTRAMUSCULAR | Status: DC | PRN
  Administered 2023-10-12 (×2): 50 ug via INTRAVENOUS

## 2023-10-12 MED ORDER — STERILE WATER FOR IRRIGATION IR SOLN
Status: DC | PRN
  Administered 2023-10-12: 2000 mL

## 2023-10-12 MED ORDER — LACTATED RINGERS IV SOLN: INTRAVENOUS | Status: DC

## 2023-10-12 MED ORDER — KETOROLAC TROMETHAMINE 30 MG/ML IJ SOLN
INTRAMUSCULAR | Status: AC
  Filled 2023-10-12: qty 1

## 2023-10-12 MED ORDER — POVIDONE-IODINE 10 % EX SWAB: 2.0000 | Freq: Once | CUTANEOUS | Status: DC

## 2023-10-12 MED ORDER — ASPIRIN 81 MG PO CHEW: 81.0000 mg | CHEWABLE_TABLET | Freq: Two times a day (BID) | ORAL | 0 refills | Status: AC

## 2023-10-12 MED ORDER — 0.9 % SODIUM CHLORIDE (POUR BTL) OPTIME
TOPICAL | Status: DC | PRN
  Administered 2023-10-12: 1000 mL

## 2023-10-12 MED ORDER — BUPIVACAINE-EPINEPHRINE (PF) 0.25% -1:200000 IJ SOLN
INTRAMUSCULAR | Status: AC
  Filled 2023-10-12: qty 30

## 2023-10-12 MED ORDER — PROPOFOL 10 MG/ML IV BOLUS
INTRAVENOUS | Status: DC | PRN
  Administered 2023-10-12: 30 mg via INTRAVENOUS

## 2023-10-12 MED ORDER — SODIUM CHLORIDE 0.9 % IV SOLN: 12.5000 mg | INTRAVENOUS | Status: DC | PRN

## 2023-10-12 MED ORDER — DEXAMETHASONE SODIUM PHOSPHATE 10 MG/ML IJ SOLN
8.0000 mg | Freq: Once | INTRAMUSCULAR | Status: AC
  Administered 2023-10-12: 8 mg via INTRAVENOUS

## 2023-10-12 MED ORDER — CEFAZOLIN SODIUM-DEXTROSE 2-4 GM/100ML-% IV SOLN
2.0000 g | INTRAVENOUS | Status: AC
  Administered 2023-10-12: 2 g via INTRAVENOUS
  Filled 2023-10-12: qty 100

## 2023-10-12 MED ORDER — SODIUM CHLORIDE (PF) 0.9 % IJ SOLN
INTRAMUSCULAR | Status: AC
Start: 2023-10-12 — End: 2023-10-12
  Filled 2023-10-12: qty 30

## 2023-10-12 MED ORDER — POLYETHYLENE GLYCOL 3350 17 G PO PACK: 17.0000 g | PACK | Freq: Two times a day (BID) | ORAL | Status: DC

## 2023-10-12 MED ORDER — PROPOFOL 500 MG/50ML IV EMUL
INTRAVENOUS | Status: DC | PRN
  Administered 2023-10-12: 50 ug/kg/min via INTRAVENOUS

## 2023-10-12 MED ORDER — BUPIVACAINE IN DEXTROSE 0.75-8.25 % IT SOLN
INTRATHECAL | Status: DC | PRN
  Administered 2023-10-12: 2 mL via INTRATHECAL

## 2023-10-12 MED ORDER — CHLORHEXIDINE GLUCONATE 0.12 % MT SOLN
15.0000 mL | Freq: Once | OROMUCOSAL | Status: AC
  Administered 2023-10-12: 15 mL via OROMUCOSAL

## 2023-10-12 MED ORDER — TRANEXAMIC ACID-NACL 1000-0.7 MG/100ML-% IV SOLN
1000.0000 mg | Freq: Once | INTRAVENOUS | Status: AC
  Administered 2023-10-12: 1000 mg via INTRAVENOUS

## 2023-10-12 MED ORDER — HYDROMORPHONE HCL 1 MG/ML IJ SOLN: 0.2500 mg | INTRAMUSCULAR | Status: DC | PRN

## 2023-10-12 MED ORDER — TRANEXAMIC ACID-NACL 1000-0.7 MG/100ML-% IV SOLN
INTRAVENOUS | Status: AC
  Filled 2023-10-12: qty 100

## 2023-10-12 MED ORDER — OXYCODONE HCL 5 MG PO TABS: 5.0000 mg | ORAL_TABLET | ORAL | 0 refills | Status: DC | PRN

## 2023-10-12 MED ORDER — SENNA 8.6 MG PO TABS: 1.0000 | ORAL_TABLET | Freq: Every day | ORAL | 0 refills | Status: AC

## 2023-10-12 SURGICAL SUPPLY — 37 items
BAG COUNTER SPONGE SURGICOUNT (BAG) IMPLANT
BAG ZIPLOCK 12X15 (MISCELLANEOUS) ×1 IMPLANT
BLADE SAG 18X100X1.27 (BLADE) ×1 IMPLANT
COVER PERINEAL POST (MISCELLANEOUS) ×1 IMPLANT
COVER SURGICAL LIGHT HANDLE (MISCELLANEOUS) ×1 IMPLANT
CUP ACETBLR 54 OD PINNACLE (Hips) IMPLANT
DERMABOND ADVANCED .7 DNX12 (GAUZE/BANDAGES/DRESSINGS) ×1 IMPLANT
DRAPE FOOT SWITCH (DRAPES) ×1 IMPLANT
DRAPE STERI IOBAN 125X83 (DRAPES) ×1 IMPLANT
DRAPE U-SHAPE 47X51 STRL (DRAPES) ×2 IMPLANT
DRESSING AQUACEL AG SP 3.5X10 (GAUZE/BANDAGES/DRESSINGS) ×1 IMPLANT
DURAPREP 26ML APPLICATOR (WOUND CARE) ×1 IMPLANT
ELECT REM PT RETURN 15FT ADLT (MISCELLANEOUS) ×1 IMPLANT
GLOVE BIO SURGEON STRL SZ 6 (GLOVE) ×1 IMPLANT
GLOVE BIOGEL PI IND STRL 6.5 (GLOVE) ×1 IMPLANT
GLOVE BIOGEL PI IND STRL 7.5 (GLOVE) ×1 IMPLANT
GLOVE ORTHO TXT STRL SZ7.5 (GLOVE) ×2 IMPLANT
GOWN STRL REUS W/ TWL LRG LVL3 (GOWN DISPOSABLE) ×2 IMPLANT
HEAD ARTICULEZE (Hips) IMPLANT
HOLDER FOLEY CATH W/STRAP (MISCELLANEOUS) ×1 IMPLANT
KIT TURNOVER KIT A (KITS) ×1 IMPLANT
LINER NEUTRAL 54X36MM PLUS 4 (Hips) IMPLANT
MANIFOLD NEPTUNE II (INSTRUMENTS) ×1 IMPLANT
NDL SAFETY ECLIPSE 18X1.5 (NEEDLE) ×1 IMPLANT
PACK ANTERIOR HIP CUSTOM (KITS) ×1 IMPLANT
PENCIL SMOKE EVACUATOR (MISCELLANEOUS) ×1 IMPLANT
SCREW 6.5MMX30MM (Screw) IMPLANT
STEM FEMORAL SZ9 HIGH ACTIS (Stem) IMPLANT
SUT MNCRL AB 4-0 PS2 18 (SUTURE) ×1 IMPLANT
SUT VIC AB 1 CT1 36 (SUTURE) ×3 IMPLANT
SUT VIC AB 2-0 CT1 TAPERPNT 27 (SUTURE) ×2 IMPLANT
SUTURE STRATFX 0 PDS 27 VIOLET (SUTURE) ×1 IMPLANT
SYR 3ML LL SCALE MARK (SYRINGE) ×1 IMPLANT
TOWEL GREEN STERILE FF (TOWEL DISPOSABLE) ×1 IMPLANT
TRAY FOLEY MTR SLVR 16FR STAT (SET/KITS/TRAYS/PACK) ×1 IMPLANT
TUBE SUCTION HIGH CAP CLEAR NV (SUCTIONS) ×1 IMPLANT
WATER STERILE IRR 1000ML POUR (IV SOLUTION) ×1 IMPLANT

## 2023-10-12 NOTE — Discharge Instructions (Signed)

## 2023-10-12 NOTE — Evaluation (Signed)
 Physical Therapy Evaluation Patient Details Name: Benjamin [name] MRN: [id number] DOB: 1940/07/15 Today's Date: 10/12/2023  History of Present Illness  83 yo male presents to therapy s/p L THA on 10/12/2023 due to failure of conservative measures. Pt PMH includes but is not limited to: HTN, DM II, IBS, HDL, R THA (2018), L TKA (7983) and R TKA on 06/16/2022.  Clinical Impression  Benjamin [name]. Benjamin [name] a 46 male POD 0 s/p L THA. Patient reports IND with mobility at baseline. Patient is now limited by functional impairments (see PT problem list below) and requires CGA for transfers and gait with RW. Patient was able to ambulate 50 and 25 feet with RW and CGA and cues for safe walker management. Patient educated on safe sequencing for stair mobility with L handrail only, fall risk prevention, use of RW, use of CP/ice, slowly increasing activity levels, pain management and goals pt  and Mliss verbalized understanding of safe guarding position for people assisting with mobility. Patient instructed in exercises to facilitate ROM and circulation reviewed and HO provided. Patient will benefit from continued skilled PT interventions to address impairments and progress towards PLOF. Patient has met mobility goals at adequate level for discharge home with family and social support and OPPT services scheduled for 9/5; will continue to follow if pt continues acute stay to progress towards Mod I goals.        If plan is discharge home, recommend the following: A little help with walking and/or transfers;A little help with bathing/dressing/bathroom;Assistance with cooking/housework;Assist for transportation;Help with stairs or ramp for entrance   Can travel by private vehicle        Equipment Recommendations None recommended by PT  Recommendations for Other Services       Functional Status Assessment Patient has had a recent decline in their functional status and demonstrates the ability to make significant  improvements in function in a reasonable and predictable amount of time.     Precautions / Restrictions Precautions Precautions: Fall Restrictions Weight Bearing Restrictions Per Provider Order: No      Mobility  Bed Mobility Overal bed mobility: Needs Assistance Bed Mobility: Supine to Sit     Supine to sit: Supervision     General bed mobility comments: min cues HOB slightly elevated    Transfers Overall transfer level: Needs assistance Equipment used: Rolling walker (2 wheels) Transfers: Sit to/from Stand Sit to Stand: Contact guard assist           General transfer comment: min cues, pt reported feeling light headed with inital standing from EOB and BP at rest semi reclined in bed 164/80 and in standing 174/89 no reports of dizziness wiht second sit to stand    Ambulation/Gait Ambulation/Gait assistance: Contact guard assist Gait Distance (Feet): 50 Feet Assistive device: Rolling walker (2 wheels) Gait Pattern/deviations: Step-to pattern, Decreased stance time - left, Antalgic, Trunk flexed Gait velocity: decreased     General Gait Details: slight trunk flexion with B UE support at RW to offload L LE in stance phase, min cues for posture, proper distance from RW and RW management  Stairs Stairs: Yes Stairs assistance: Contact guard assist Stair Management: One rail Left, Two rails Number of Stairs: 2 General stair comments: stept navigation with B handrails and cues for safety, technque and sequencing with step to pattern with pt able to progress to HHA and use of L handrail only with cues as above  Wheelchair Mobility     Tilt Bed  Modified Rankin (Stroke Patients Only)       Balance Overall balance assessment: Needs assistance, History of Falls (Sunday 10/10/2023) Sitting-balance support: Feet supported Sitting balance-Leahy Scale: Good     Standing balance support: Bilateral upper extremity supported, During functional activity, Reliant on  assistive device for balance Standing balance-Leahy Scale: Poor                               Pertinent Vitals/Pain Pain Assessment Pain Assessment: 0-10 Pain Score: 0-No pain Pain Location: L LE and hip Pain Descriptors / Indicators: Sore Pain Intervention(s): Limited activity within patient's tolerance, Monitored during session, Premedicated before session, Ice applied, Repositioned    Home Living Family/patient expects to be discharged to:: Private residence Living Arrangements: Spouse/significant other Available Help at Discharge: Family Type of Home: House Home Access: Stairs to enter Entrance Stairs-Rails: Left Entrance Stairs-Number of Steps: 3   Home Layout: One level Home Equipment: Agricultural consultant (2 wheels)      Prior Function Prior Level of Function : Independent/Modified Independent;Working/employed             Mobility Comments: IND no AD for all ADLs, self care tasks and IADLs       Extremity/Trunk Assessment        Lower Extremity Assessment Lower Extremity Assessment: LLE deficits/detail LLE Deficits / Details: ankle DF/PF 5/5 LLE Sensation: WNL (slight abn sensation L heel once in standing)    Cervical / Trunk Assessment Cervical / Trunk Assessment: Normal  Communication   Communication Communication: Impaired Factors Affecting Communication: Hearing impaired    Cognition Arousal: Alert Behavior During Therapy: WFL for tasks assessed/performed   PT - Cognitive impairments: No apparent impairments                         Following commands: Intact       Cueing       General Comments General comments (skin integrity, edema, etc.): L proximal LE bruise secondary to recent fall 8/31    Exercises Total Joint Exercises Ankle Circles/Pumps: AROM, Both, 10 reps Quad Sets: AROM, Left, 5 reps Heel Slides: AROM, Left, 5 reps Hip ABduction/ADduction: AROM, Left, 5 reps, Standing Long Arc Quad: AROM, Left, 5 reps,  Seated Knee Flexion: AROM, Left, 5 reps, Standing Marching in Standing: AROM, Left, 5 reps, Standing   Assessment/Plan    PT Assessment Patient needs continued PT services  PT Problem List Decreased strength;Decreased range of motion;Decreased activity tolerance;Decreased balance;Decreased mobility;Decreased coordination;Pain       PT Treatment Interventions DME instruction;Gait training;Stair training;Functional mobility training;Therapeutic activities;Therapeutic exercise;Balance training;Neuromuscular re-education;Patient/family education;Modalities    PT Goals (Current goals can be found in the Care Plan section)  Acute Rehab PT Goals Patient Stated Goal: walk PT Goal Formulation: With patient Time For Goal Achievement: 10/26/23 Potential to Achieve Goals: Good    Frequency 7X/week     Co-evaluation               AM-PAC PT 6 Clicks Mobility  Outcome Measure Help needed turning from your back to your side while in a flat bed without using bedrails?: None Help needed moving from lying on your back to sitting on the side of a flat bed without using bedrails?: A Little Help needed moving to and from a bed to a chair (including a wheelchair)?: A Little Help needed standing up from a chair using your arms (e.g., wheelchair or  bedside chair)?: A Little Help needed to walk in hospital room?: A Little Help needed climbing 3-5 steps with a railing? : A Little 6 Click Score: 19    End of Session Equipment Utilized During Treatment: Gait belt Activity Tolerance: Patient tolerated treatment well;No increased pain Patient left: in chair;with call bell/phone within reach;with family/visitor present;with nursing/sitter in room Nurse Communication: Mobility status;Other (comment) (pt readiness for same day d/c from PT perspective) PT Visit Diagnosis: Unsteadiness on feet (R26.81);Other abnormalities of gait and mobility (R26.89);Muscle weakness (generalized) (M62.81);Difficulty in  walking, not elsewhere classified (R26.2);Pain Pain - Right/Left: Left Pain - part of body: Leg;Hip    Time: 1822-1906 PT Time Calculation (min) (ACUTE ONLY): 44 min   Charges:   PT Evaluation $PT Eval Low Complexity: 1 Low PT Treatments $Gait Training: 8-22 mins $Therapeutic Exercise: 8-22 mins PT General Charges $$ ACUTE PT VISIT: 1 Visit         [place], PT Acute Rehab   [place] VEAR Drone 10/12/2023, 7:33 PM

## 2023-10-12 NOTE — Anesthesia Preprocedure Evaluation (Addendum)
 Anesthesia Evaluation  Patient identified by MRN, date of birth, ID band Patient awake    Reviewed: Allergy & Precautions, NPO status , Patient's Chart, lab work & pertinent test results  History of Anesthesia Complications Negative for: history of anesthetic complications  Airway Mallampati: III  TM Distance: >3 FB Neck ROM: Full   Comment: Previous grade II view with MAC 4, easy mask Dental  (+) Dental Advisory Given,    Pulmonary neg shortness of breath, neg sleep apnea, neg COPD, neg recent URI, former smoker   Pulmonary exam normal breath sounds clear to auscultation       Cardiovascular hypertension, Pt. 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)  Rhythm:Regular Rate:Normal  HLD   Neuro/Psych neg Seizures PSYCHIATRIC DISORDERS Anxiety Depression       GI/Hepatic Neg liver ROS,GERD  Medicated,,  Endo/Other  diabetes (patient denies)    Renal/GU Renal disease     Musculoskeletal  (+) Arthritis ,    Abdominal  (+) + obese  Peds  Hematology negative hematology ROS (+)   Anesthesia Other Findings   Reproductive/Obstetrics                              Anesthesia Physical Anesthesia Plan  ASA: 3  Anesthesia Plan: MAC and Spinal   Post-op Pain Management:    Induction: Intravenous  PONV Risk Score and Plan: 1 and Propofol  infusion and Treatment may vary due to age or medical condition  Airway Management Planned: Natural Airway and Simple Face Mask  Additional Equipment:   Intra-op Plan:   Post-operative Plan:   Informed Consent: I have reviewed the patients History and Physical, chart, labs and discussed the procedure including the risks, benefits and alternatives for the proposed anesthesia with the patient or authorized representative who has indicated his/her understanding and acceptance.     Dental  advisory given  Plan Discussed with: CRNA and Anesthesiologist  Anesthesia Plan Comments: (I have discussed risks of neuraxial anesthesia including but not limited to infection, bleeding, nerve injury, back pain, headache, seizures, and failure of block. Patient denies bleeding disorders and is not currently anticoagulated. Labs have been reviewed. Risks and benefits discussed. All patient's questions answered.   Discussed with patient risks of MAC including, but not limited to, minor pain or discomfort, hearing people in the room, and possible need for backup general anesthesia. Risks for general anesthesia also discussed including, but not limited to, sore throat, hoarse voice, chipped/damaged teeth, injury to vocal cords, nausea and vomiting, allergic reactions, lung infection, heart attack, stroke, and death. All questions answered. )         Anesthesia Quick Evaluation

## 2023-10-12 NOTE — Anesthesia Postprocedure Evaluation (Signed)
 Anesthesia Post Note  Patient: Benjamin [name]  Procedure(s) Performed: ARTHROPLASTY, HIP, TOTAL, ANTERIOR APPROACH (Left: Hip)     Patient location during evaluation: PACU Anesthesia Type: MAC Level of consciousness: awake and alert Pain management: pain level controlled Vital Signs Assessment: post-procedure vital signs reviewed and stable Respiratory status: spontaneous breathing, nonlabored ventilation and respiratory function stable Cardiovascular status: blood pressure returned to baseline and stable Postop Assessment: no apparent nausea or vomiting Anesthetic complications: no   No notable events documented.  Last Vitals:  Vitals:   10/12/23 1400 10/12/23 1415  BP: (!) 151/67 (!) 171/78  Pulse: (!) 45 (!) 49  Resp: 12 11  Temp:    SpO2: 100% 98%    Last Pain:  Vitals:   10/12/23 1415  TempSrc:   PainSc: 0-No pain                 Butler Levander Pinal

## 2023-10-12 NOTE — Interval H&P Note (Signed)
 History and Physical Interval Note:  10/12/2023 10:09 AM  Benjamin [name]  has presented today for surgery, with the diagnosis of Left hip osteoarthritis.  The various methods of treatment have been discussed with the patient and family. After consideration of risks, benefits and other options for treatment, the patient has consented to  Procedure(s): ARTHROPLASTY, HIP, TOTAL, ANTERIOR APPROACH (Left) as a surgical intervention.  The patient's history has been reviewed, patient examined, no change in status, stable for surgery.  I have reviewed the patient's chart and labs.  Questions were answered to the patient's satisfaction.     [name]

## 2023-10-12 NOTE — Transfer of Care (Signed)
 Immediate Anesthesia Transfer of Care Note  Patient: Benjamin [name]  Procedure(s) Performed: ARTHROPLASTY, HIP, TOTAL, ANTERIOR APPROACH (Left: Hip)  Patient Location: PACU  Anesthesia Type:Spinal and MAC combined with regional for post-op pain  Level of Consciousness: drowsy and patient cooperative  Airway & Oxygen Therapy: Patient Spontanous Breathing and Patient connected to face mask oxygen  Post-op Assessment: Report given to RN and Post -op Vital signs reviewed and stable  Post vital signs: Reviewed and stable  Last Vitals:  Vitals Value Taken Time  BP 133/50 10/12/23 13:15  Temp    Pulse 64 10/12/23 13:15  Resp 11 10/12/23 13:15  SpO2 100 % 10/12/23 13:15  Vitals shown include unfiled device data.  Last Pain:  Vitals:   10/12/23 0911  TempSrc: Oral  PainSc: 3       Patients Stated Pain Goal: 4 (10/12/23 0911)  Complications: No notable events documented.

## 2023-10-12 NOTE — Op Note (Signed)
 NAME:  Benjamin [name]                ACCOUNT NO.: [id number]      MEDICAL RECORD NO.: [id number]      FACILITY:  [facility]      PHYSICIAN:  [name]  DATE OF BIRTH:  05-17-1940     DATE OF PROCEDURE:  10/12/2023                                 OPERATIVE REPORT         PREOPERATIVE DIAGNOSIS: Left  hip osteoarthritis.      POSTOPERATIVE DIAGNOSIS:  Left hip osteoarthritis.      PROCEDURE:  Left total hip replacement through an anterior approach   utilizing DePuy THR system, component size 54 mm pinnacle cup, a size 36+4 neutral   Altrex liner, a size 9 Hi Actis stem with a 36+5 Articuleze metal head ball.      SURGEON:  Donnice JONETTA. Car, M.D.      ASSISTANT:  Randine Ricks, PA-C     ANESTHESIA:  Spinal.      SPECIMENS:  None.      COMPLICATIONS:  None.      BLOOD LOSS:  300 cc     DRAINS:  None.      INDICATION OF THE PROCEDURE:  Benjamin [name] is a 83 y.o. male who had   presented to office for evaluation of left hip pain.  Radiographs revealed   progressive degenerative changes with bone-on-bone   articulation of the  hip joint, including subchondral cystic changes and osteophytes.  The patient had painful limited range of   motion significantly affecting their overall quality of life and function.  The patient was failing to    respond to conservative measures including medications and/or injections and activity modification and at this point was ready   to proceed with more definitive measures.  Consent was obtained for   benefit of pain relief.  Specific risks of infection, DVT, component   failure, dislocation, neurovascular injury, and need for revision surgery were reviewed in the office.     PROCEDURE IN DETAIL:  The patient was brought to operative theater.   Once adequate anesthesia, preoperative antibiotics, 2 gm of Ancef , 1 gm of Tranexamic Acid , and 10 mg of Decadron  were administered, the patient was positioned supine on the  [employer] table.  Once the patient was safely positioned with adequate padding of boney prominences we predraped out the hip, and used fluoroscopy to confirm orientation of the pelvis.      The left hip was then prepped and draped from proximal iliac crest to   mid thigh with a shower curtain technique.      Time-out was performed identifying the patient, planned procedure, and the appropriate extremity.     An incision was then made 2 cm lateral to the   anterior superior iliac spine extending over the orientation of the   tensor fascia lata muscle and sharp dissection was carried down to the   fascia of the muscle.      The fascia was then incised.  The muscle belly was identified and swept   laterally and retractor placed along the superior neck.  Following   cauterization of the circumflex vessels and removing some pericapsular   fat, a second cobra retractor was placed on the inferior neck.  A T-capsulotomy was made along the line of the   superior neck to the trochanteric fossa, then extended proximally and   distally.  Tag sutures were placed and the retractors were then placed   intracapsular.  We then identified the trochanteric fossa and   orientation of my neck cut and then made a neck osteotomy with the femur on traction.  The femoral   head was removed without difficulty or complication.  Traction was let   off and retractors were placed posterior and anterior around the   acetabulum.      The labrum and foveal tissue were debrided.  I began reaming with a 47 mm   reamer and reamed up to 53 mm reamer with good bony bed preparation and a 54 mm  cup was chosen.  The final 54 mm Pinnacle cup was then impacted under fluoroscopy to confirm the depth of penetration and orientation with respect to   Abduction and forward flexion.  A screw was placed into the ilium followed by the hole eliminator.  The final   36+4 neutral Altrex liner was impacted with good visualized rim fit.  The  cup was positioned anatomically within the acetabular portion of the pelvis.      At this point, the femur was rolled to 100 degrees.  Further capsule was   released off the inferior aspect of the femoral neck.  I then   released the superior capsule proximally.  With the leg in a neutral position the hook was placed laterally   along the femur under the vastus lateralis origin and elevated manually and then held in position using the hook attachment on the bed.  The leg was then extended and adducted with the leg rolled to 100   degrees of external rotation.  Retractors were placed along the medial calcar and posteriorly over the greater trochanter.  Once the proximal femur was fully   exposed, I used a box osteotome to set orientation.  I then began   broaching with the starting chili pepper broach and passed this by hand and then broached up to 9.  With the 9 broach in place I chose a high offset neck and did several trial reductions.  The offset was appropriate, leg lengths   appeared to be equal best matched with the +5 head ball trial confirmed radiographically.   Given these findings, I went ahead and dislocated the hip, repositioned all   retractors and positioned the right hip in the extended and abducted position.  The final 9 Hi Actis stem was   chosen and it was impacted down to the level of neck cut.  Based on this   and the trial reductions, a final 36+5 Articuleze metal head ball was chosen and   impacted onto a clean and dry trunnion, and the hip was reduced.  The   hip had been irrigated throughout the case again at this point.  I did   reapproximate the superior capsular leaflet to the anterior leaflet   using #1 Vicryl.  The fascia of the   tensor fascia lata muscle was then reapproximated using #1 Vicryl and #0 Stratafix sutures.  The   remaining wound was closed with 2-0 Vicryl and running 4-0 Monocryl.   The hip was cleaned, dried, and dressed sterilely using Dermabond and    Aquacel dressing.  The patient was then brought   to recovery room in stable condition tolerating the procedure well.    Randine Shuford<  PA-C was present for the entirety of the case involved from   preoperative positioning, perioperative retractor management, general   facilitation of the case, as well as primary wound closure as assistant.            Donnice CORDOBA Ernie, M.D.        10/12/2023 10:10 AM

## 2023-10-12 NOTE — H&P (Signed)
 TOTAL HIP ADMISSION H&P  Patient is admitted for left total hip arthroplasty.  Therapy Plans: HEP Disposition: Home with Celestia Divers (lives together) Planned DVT Prophylaxis: aspirin  81mg  BID DME needed: walker PCP: Dr. Johnny - clearance received TXA: Allergies: codeine Anesthesia Concerns: none BMI: 32.4 Last HgbA1c: Not diabetic   Other: - Patient has enlarged prostate -- self caths sometimes - Hopeful for SDD - No hx of VTE or cancer - oxycodone , robaxin , tylenol , celebrex   Subjective:  Chief Complaint: left hip pain  HPI: Benjamin [name], 83 y.o. male, has a history of pain and functional disability in the left hip(s) due to arthritis and patient has failed non-surgical conservative treatments for greater than 12 weeks to include NSAID's and/or analgesics and activity modification.  Onset of symptoms was gradual starting 2 years ago with gradually worsening course since that time.The patient noted no past surgery on the left hip(s).  Patient currently rates pain in the left hip at 8 out of 10 with activity. Patient has worsening of pain with activity and weight bearing, pain that interfers with activities of daily living, and pain with passive range of motion. Patient has evidence of joint space narrowing by imaging studies. This condition presents safety issues increasing the risk of falls. There is no current active infection.  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   Patellofemoral pain syndrome of both knees 04/08/2020   Insomnia 12/30/2016   Depression with anxiety 12/04/2014   Right shoulder pain 12/04/2014   HTN (hypertension) 08/02/2014   Obese 07/18/2014   Diabetes mellitus type 2, controlled, without complications (HCC) 04/14/2011   History of colonic polyps 02/26/2010   Umbilical hernia 08/16/2009   ED (erectile dysfunction) 06/24/2009   IRRITABLE BOWEL SYNDROME 09/03/2008   BPH with urinary  obstruction 02/16/2007   PROSTATE SPECIFIC ANTIGEN, ELEVATED 01/26/2007   Dyslipidemia 10/12/2006   Past Medical History:  Diagnosis Date   Allergy    Anxiety    Arthritis    BPH (benign prostatic hyperplasia)    with elevated PSA, sees Dr. Matilda, benign   Cataract    Elevated PSA    sees Dr. Matilda    GERD (gastroesophageal reflux disease)    Hyperlipidemia    Hypertension    Insomnia    Platelets decreased (HCC)    Pneumonia    walking PNA   Pre-diabetes    No meds   Self-catheterizes urinary bladder     Past Surgical History:  Procedure Laterality Date   CHOLECYSTECTOMY N/A 01/10/2014   Procedure: LAPAROSCOPIC CHOLECYSTECTOMY WITH INTRAOPERATIVE CHOLANGIOGRAM;  Surgeon: Donnice KATHEE Lunger, MD;  Location: [facility];  Service: General;  Laterality: N/A;   COLONOSCOPY  08/03/2018   per Dr. Avram, adenomatous polyps, no repeats needed    IR ANGIOGRAM PELVIS SELECTIVE OR SUPRASELECTIVE  01/13/2023   IR ANGIOGRAM SELECTIVE EACH ADDITIONAL VESSEL  01/13/2023   IR EMBO TUMOR ORGAN ISCHEMIA INFARCT INC GUIDE ROADMAPPING  01/13/2023   IR RADIOLOGIST EVAL & MGMT  11/18/2022   IR US  GUIDE VASC ACCESS LEFT  01/13/2023   IR US  GUIDE VASC ACCESS RIGHT  01/13/2023   JOINT REPLACEMENT     Right hip arthroplasty Dr. Ernie 01/05/17    left knee arthroscopy      LIPOSUCTION  1987   prostate biopsies in 2003 and again 05/2008     per Dr. Matilda. benign   REFRACTIVE SURGERY Bilateral    cataract surgery  repair  of large umblicial hernia     per Dr. Adina Lunger on 01/10/10   right knee arthroscopy     TONSILLECTOMY     TOTAL HIP ARTHROPLASTY Right 01/05/2017   Procedure: RIGHT TOTAL HIP ARTHROPLASTY ANTERIOR APPROACH;  Surgeon: Ernie Cough, MD;  Location: [facility];  Service: Orthopedics;  Laterality: Right;  70 mins   TOTAL KNEE ARTHROPLASTY Left 07/16/2014   Procedure: LEFT TOTAL KNEE ARTHROPLASTY;  Surgeon: Cough Ernie, MD;  Location: [facility];  Service: Orthopedics;   Laterality: Left;   TOTAL KNEE ARTHROPLASTY Right 06/16/2022   Procedure: TOTAL KNEE ARTHROPLASTY;  Surgeon: Ernie Cough, MD;  Location: [facility];  Service: Orthopedics;  Laterality: Right;    No current facility-administered medications for this encounter.   Current Outpatient Medications  Medication Sig Dispense Refill Last Dose/Taking   ALPRAZolam  (XANAX ) 0.5 MG tablet Take 1 tablet (0.5 mg total) by mouth every 6 (six) hours as needed for anxiety. 120 tablet 5 Taking As Needed   atorvastatin  (LIPITOR) 20 MG tablet Take 1 tablet (20 mg total) by mouth daily. 90 tablet 3 Taking   cyanocobalamin  (VITAMIN B12) 1000 MCG tablet Take 1,000 mcg by mouth once a week.   Taking   latanoprost  (XALATAN ) 0.005 % ophthalmic solution Place 1 drop into both eyes at bedtime as needed (pt takes when he remembers).   Taking As Needed   lisinopril -hydrochlorothiazide  (ZESTORETIC ) 10-12.5 MG tablet Take 1 tablet by mouth daily. 90 tablet 3 Taking   meloxicam (MOBIC) 15 MG tablet Take 15 mg by mouth daily.   Taking   methocarbamol  (ROBAXIN ) 500 MG tablet Take 1 tablet (500 mg total) by mouth every 6 (six) hours as needed for muscle spasms. 120 tablet 2 Taking As Needed   omeprazole  (PRILOSEC ) 40 MG capsule TAKE ONE CAPSULE BY MOUTH TWICE DAILY (Patient taking differently: Take 40 mg by mouth at bedtime.) 180 capsule 0 Taking Differently   testosterone  cypionate (DEPOTESTOSTERONE CYPIONATE) 200 MG/ML injection Inject 1 mL (200 mg total) into the muscle every 7 (seven) days. 10 mL 2 Taking   Syringe/Needle, Disp, (SYRINGE 3CC/22GX1-1/2) 22G X 1-1/2 3 ML MISC 1 Application by Does not apply route every 7 (seven) days. 50 each 0    tadalafil  (CIALIS ) 20 MG tablet Take 1 tablet (20 mg total) by mouth as needed for erectile dysfunction. 30 tablet 11    Facility-Administered Medications Ordered in Other Encounters  Medication Dose Route Frequency Provider Last Rate Last Admin   lidocaine  (XYLOCAINE ) 2 % jelly 1  Application  1 Application Topical Once Turpin, Pamela, PA-C       Allergies  Allergen Reactions   Norco [Hydrocodone -Acetaminophen ]     Hallucinations with heavy doses     Social History   Tobacco Use   Smoking status: Former    Current packs/day: 0.00    Types: Cigarettes    Quit date: 02/09/1974    Years since quitting: 49.7   Smokeless tobacco: Never   Tobacco comments:    quit over 3 yo, absolutely smoked more than 100 cig; referred for [employer]   Substance Use Topics   Alcohol use: Yes    Alcohol/week: 14.0 standard drinks of alcohol    Types: 14 Shots of liquor per week    Comment: drinks a couple shots daily    Family History  Problem Relation Age of Onset   Alzheimer's disease Other    Colon cancer Neg Hx    Esophageal cancer Neg Hx  Pancreatic cancer Neg Hx    Prostate cancer Neg Hx    Rectal cancer Neg Hx    Stomach cancer Neg Hx      Review of Systems  Constitutional:  Negative for chills and fever.  Respiratory:  Negative for cough and shortness of breath.   Cardiovascular:  Negative for chest pain.  Gastrointestinal:  Negative for nausea and vomiting.  Musculoskeletal:  Positive for arthralgias.     Objective:  Physical Exam Well nourished and well developed. General: Alert and oriented x3, cooperative and pleasant, no acute distress.  Musculoskeletal: Left hip exam: Pain and limited hip flexion internal rotation over 5 degrees with external rotation over 20 degrees both reproducing pain anterior and lateral Slight external rotation contracture with active hip flexion with maintenance of 5/5 strength No significant lower extremity edema, erythema or calf tenderness    Calves soft and nontender. Motor function intact in LE. Strength 5/5 LE bilaterally. Neuro: Distal pulses 2+. Sensation to light touch intact in LE.  Vital signs in last 24 hours:    Labs:   Estimated body mass index is 31.77 kg/m as calculated from the following:   Height  as of 09/29/23: 5' 11.5 (1.816 m).   Weight as of 09/29/23: 104.8 kg.   Imaging Review Plain radiographs demonstrate severe degenerative joint disease of the left hip(s). The bone quality appears to be adequate for age and reported activity level.      Assessment/Plan:  End stage arthritis, left hip(s)  The patient history, physical examination, clinical judgement of the provider and imaging studies are consistent with end stage degenerative joint disease of the left hip(s) and total hip arthroplasty is deemed medically necessary. The treatment options including medical management, injection therapy, arthroscopy and arthroplasty were discussed at length. The risks and benefits of total hip arthroplasty were presented and reviewed. The risks due to aseptic loosening, infection, stiffness, dislocation/subluxation,  thromboembolic complications and other imponderables were discussed.  The patient acknowledged the explanation, agreed to proceed with the plan and consent was signed. Patient is being admitted for inpatient treatment for surgery, pain control, PT, OT, prophylactic antibiotics, VTE prophylaxis, progressive ambulation and ADL's and discharge planning.The patient is planning to be discharged home.   Rosina Calin, PA-C Orthopedic Surgery [facility] [place] [phone]

## 2023-10-13 ENCOUNTER — Encounter (HOSPITAL_COMMUNITY): Payer: Self-pay | Admitting: Orthopedic Surgery

## 2023-10-22 DIAGNOSIS — N3000 Acute cystitis without hematuria: Secondary | ICD-10-CM | POA: Diagnosis not present

## 2023-10-22 DIAGNOSIS — N401 Enlarged prostate with lower urinary tract symptoms: Secondary | ICD-10-CM | POA: Diagnosis not present

## 2023-10-22 DIAGNOSIS — R8271 Bacteriuria: Secondary | ICD-10-CM | POA: Diagnosis not present

## 2023-10-22 DIAGNOSIS — R3914 Feeling of incomplete bladder emptying: Secondary | ICD-10-CM | POA: Diagnosis not present

## 2023-11-11 ENCOUNTER — Encounter (INDEPENDENT_AMBULATORY_CARE_PROVIDER_SITE_OTHER): Payer: Self-pay | Admitting: Otolaryngology

## 2023-11-11 ENCOUNTER — Ambulatory Visit (INDEPENDENT_AMBULATORY_CARE_PROVIDER_SITE_OTHER): Admitting: Otolaryngology

## 2023-11-11 ENCOUNTER — Ambulatory Visit (INDEPENDENT_AMBULATORY_CARE_PROVIDER_SITE_OTHER): Admitting: Audiology

## 2023-11-11 VITALS — BP 156/78 | HR 66 | Temp 97.6°F | Ht 71.5 in | Wt 221.0 lb

## 2023-11-11 DIAGNOSIS — H903 Sensorineural hearing loss, bilateral: Secondary | ICD-10-CM

## 2023-11-11 NOTE — Progress Notes (Signed)
  [address] [place], [place] [address] [phone]  Audiological Evaluation    Name: Benjamin [name]     DOB:   Jun 29, 1940      MRN:   [id number]                                                                                     Service Date: 11/11/2023     Accompanied by: unaccompanied   Patient comes today after Dr. Karis, ENT sent a referral for a hearing evaluation due to concerns with drop in word recognition score per hearing evaluation he had in [employer].   Symptoms Yes Details  Hearing loss  [x]  Known bilateral hearing loss, had his hearing test repeated at [facility] and showed a drop in WRS in both ears.  Tinnitus  []    Ear pain/ infections/pressure  []    Balance problems  []    Noise exposure history  []    Previous ear surgeries  []    Family history of hearing loss  []    Amplification  [x]  Has a set from [employer]  Other  []      Otoscopy: Right ear: Non-occluding cerumen, able to visualize some tympanic membrane landmarks. Left ear:  Non-occluding cerumen, able to visualize some tympanic membrane landmarks.  Tympanometry: Right ear: Type As- Normal external ear canal volume with normal middle ear pressure and low tympanic membrane compliance. Left ear: Type A- Normal external ear canal volume with normal middle ear pressure and tympanic membrane compliance.  Pure tone Audiometry: Right ear- Normal sloping to profound sensorineural hearing loss from 125 Hz - 8000 Hz. Left ear-  Normal sloping to severe sensorineural hearing loss from 125 Hz - 8000 Hz.  Speech Audiometry: Right ear- Speech Reception Threshold (SRT) was obtained at 40 dBHL. Left ear-Speech Reception Threshold (SRT) was obtained at 40 dBHL.   Word Recognition Score Tested using NU-6 (recorded) Right ear: 88% was obtained at a presentation level of 90 dBHL with contralateral masking which is deemed as  good . Left ear: 92% was obtained at a presentation level of 90 dBHL with contralateral  masking which is deemed as  excellent.   The hearing test results were completed under headphones and results are deemed to be of good reliability. Test technique:  conventional    Impression: There is a slight hearing asymmetry, worse in the right ear from 3000-8000 Hz.  Recommendations: Follow up with ENT as scheduled for today. Return for a hearing evaluation if concerns with hearing changes arise or per MD recommendation.   Nyshawn Gowdy MARIE LEROUX-MARTINEZ, AUD

## 2023-11-13 DIAGNOSIS — H903 Sensorineural hearing loss, bilateral: Secondary | ICD-10-CM | POA: Insufficient documentation

## 2023-11-13 NOTE — Progress Notes (Signed)
 CC: Progressive hearing loss  HPI:  Benjamin [name] is a 83 y.o. male who presents today complaining of bilateral progressive hearing loss for many years.  He has been wearing bilateral hearing aids for the past 18 months.  He has a history of recurrent childhood otitis media.  However, he has not had any recent ear infection.  He has no previous otologic surgery.  He has a history of occupational loud noise exposure.  Currently he denies any otalgia, otorrhea, or vertigo.  Past Medical History:  Diagnosis Date   Allergy    Anxiety    Arthritis    BPH (benign prostatic hyperplasia)    with elevated PSA, sees Dr. Matilda, benign   Cataract    Elevated PSA    sees Dr. Matilda    GERD (gastroesophageal reflux disease)    Hyperlipidemia    Hypertension    Insomnia    Platelets decreased    Pneumonia    walking PNA   Pre-diabetes    No meds   Self-catheterizes urinary bladder     Past Surgical History:  Procedure Laterality Date   CHOLECYSTECTOMY N/A 01/10/2014   Procedure: LAPAROSCOPIC CHOLECYSTECTOMY WITH INTRAOPERATIVE CHOLANGIOGRAM;  Surgeon: Donnice KATHEE Lunger, MD;  Location: [facility];  Service: General;  Laterality: N/A;   COLONOSCOPY  08/03/2018   per Dr. Avram, adenomatous polyps, no repeats needed    IR ANGIOGRAM PELVIS SELECTIVE OR SUPRASELECTIVE  01/13/2023   IR ANGIOGRAM SELECTIVE EACH ADDITIONAL VESSEL  01/13/2023   IR EMBO TUMOR ORGAN ISCHEMIA INFARCT INC GUIDE ROADMAPPING  01/13/2023   IR RADIOLOGIST EVAL & MGMT  11/18/2022   IR US  GUIDE VASC ACCESS LEFT  01/13/2023   IR US  GUIDE VASC ACCESS RIGHT  01/13/2023   JOINT REPLACEMENT     Right hip arthroplasty Dr. Ernie 01/05/17    left knee arthroscopy      LIPOSUCTION  1987   prostate biopsies in 2003 and again 05/2008     per Dr. Matilda. benign   REFRACTIVE SURGERY Bilateral    cataract surgery   repair  of large umblicial hernia     per Dr. Adina Lunger on 01/10/10   right knee arthroscopy      TONSILLECTOMY     TOTAL HIP ARTHROPLASTY Right 01/05/2017   Procedure: RIGHT TOTAL HIP ARTHROPLASTY ANTERIOR APPROACH;  Surgeon: Ernie Donnice, MD;  Location: [facility];  Service: Orthopedics;  Laterality: Right;  70 mins   TOTAL HIP ARTHROPLASTY Left 10/12/2023   Procedure: ARTHROPLASTY, HIP, TOTAL, ANTERIOR APPROACH;  Surgeon: Ernie Donnice, MD;  Location: [facility];  Service: Orthopedics;  Laterality: Left;   TOTAL KNEE ARTHROPLASTY Left 07/16/2014   Procedure: LEFT TOTAL KNEE ARTHROPLASTY;  Surgeon: Donnice Ernie, MD;  Location: [facility];  Service: Orthopedics;  Laterality: Left;   TOTAL KNEE ARTHROPLASTY Right 06/16/2022   Procedure: TOTAL KNEE ARTHROPLASTY;  Surgeon: Ernie Donnice, MD;  Location: [facility];  Service: Orthopedics;  Laterality: Right;    Family History  Problem Relation Age of Onset   Alzheimer's disease Other    Colon cancer Neg Hx    Esophageal cancer Neg Hx    Pancreatic cancer Neg Hx    Prostate cancer Neg Hx    Rectal cancer Neg Hx    Stomach cancer Neg Hx     Social History:  reports that he quit smoking about 49 years ago. His smoking use included cigarettes. He has never used smokeless tobacco. He reports current alcohol use of about 14.0 standard  drinks of alcohol per week. He reports current drug use. Drug: Marijuana.  Allergies:  Allergies  Allergen Reactions   Norco [Hydrocodone -Acetaminophen ]     Hallucinations with heavy doses    Meloxicam Other (See Comments)    Stomach upset    Prior to Admission medications   Medication Sig Start Date End Date Taking? Authorizing Provider  ALPRAZolam  (XANAX ) 0.5 MG tablet Take 1 tablet (0.5 mg total) by mouth every 6 (six) hours as needed for anxiety. 05/18/23  Yes Johnny Garnette LABOR, MD  atorvastatin  (LIPITOR) 20 MG tablet Take 1 tablet (20 mg total) by mouth daily. 07/12/23  Yes Johnny Garnette LABOR, MD  cyanocobalamin  (VITAMIN B12) 1000 MCG tablet Take 1,000 mcg by mouth once a week.   Yes [provider]  latanoprost   (XALATAN ) 0.005 % ophthalmic solution Place 1 drop into both eyes at bedtime as needed (pt takes when he remembers). 12/08/19  Yes [provider]  lisinopril -hydrochlorothiazide  (ZESTORETIC ) 10-12.5 MG tablet Take 1 tablet by mouth daily. 09/20/23  Yes Johnny Garnette LABOR, MD  methocarbamol  (ROBAXIN ) 500 MG tablet Take 1 tablet (500 mg total) by mouth every 6 (six) hours as needed for muscle spasms. 08/27/23  Yes Johnny Garnette LABOR, MD  omeprazole  (PRILOSEC ) 40 MG capsule TAKE ONE CAPSULE BY MOUTH TWICE DAILY Patient taking differently: Take 40 mg by mouth at bedtime. 10/15/22  Yes Johnny Garnette LABOR, MD  oxyCODONE  (ROXICODONE ) 5 MG immediate release tablet Take 1 tablet (5 mg total) by mouth every 4 (four) hours as needed for severe pain (pain score 7-10). 10/12/23  Yes Patti Rosina SAUNDERS, PA-C  polyethylene glycol (MIRALAX  / GLYCOLAX ) 17 g packet Take 17 g by mouth 2 (two) times daily. 10/12/23  Yes Patti Rosina SAUNDERS, PA-C  pregabalin (LYRICA) 75 MG capsule Take 75 mg by mouth 2 (two) times daily.   Yes [provider]  Syringe/Needle, Disp, (SYRINGE 3CC/22GX1-1/2) 22G X 1-1/2 3 ML MISC 1 Application by Does not apply route every 7 (seven) days. 02/18/22  Yes Johnny Garnette LABOR, MD  tadalafil  (CIALIS ) 20 MG tablet Take 1 tablet (20 mg total) by mouth as needed for erectile dysfunction. 05/11/23  Yes Johnny Garnette LABOR, MD  testosterone  cypionate (DEPOTESTOSTERONE CYPIONATE) 200 MG/ML injection Inject 1 mL (200 mg total) into the muscle every 7 (seven) days. 06/15/23  Yes Johnny Garnette LABOR, MD    Blood pressure (!) 156/78, pulse 66, temperature 97.6 F (36.4 C), temperature source Oral, height 5' 11.5 (1.816 m), weight 221 lb (100.2 kg), peak flow 96 L/min. Exam: General: Communicates without difficulty, well nourished, no acute distress. Head: Normocephalic, no evidence injury, no tenderness, facial buttresses intact without stepoff. Face/sinus: No tenderness to palpation and percussion. Facial movement is normal  and symmetric. Eyes: PERRL, EOMI. No scleral icterus, conjunctivae clear. Neuro: CN II exam reveals vision grossly intact.  No nystagmus at any point of gaze. Ears: Auricles well formed without lesions.  Ear canals are intact without mass or lesion.  No erythema or edema is appreciated.  The TMs are intact without fluid. Nose: External evaluation reveals normal support and skin without lesions.  Dorsum is intact.  Anterior rhinoscopy reveals normal mucosa over anterior aspect of inferior turbinates and intact septum.  No purulence noted. Oral:  Oral cavity and oropharynx are intact, symmetric, without erythema or edema.  Mucosa is moist without lesions. Neck: Full range of motion without pain.  There is no significant lymphadenopathy.  No masses palpable.  Thyroid  bed within normal limits  to palpation.  Parotid glands and submandibular glands equal bilaterally without mass.  Trachea is midline. Neuro:  CN 2-12 grossly intact.   His hearing test shows bilateral symmetric high-frequency sensorineural hearing loss.  Assessment: 1.  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 2.  His ear canals, tympanic membranes, and middle ear spaces are all normal.  Plan: 1.  The physical exam findings and the hearing test results are reviewed with the patient. 2.  Continue the use of his hearing aids. 3.  The importance of hearing protection is discussed. 4.  The patient will return for reevaluation in 1 year.  Graycie Halley W Teasia Zapf 11/13/2023, 8:23 AM

## 2023-11-18 DIAGNOSIS — R339 Retention of urine, unspecified: Secondary | ICD-10-CM | POA: Diagnosis not present

## 2023-11-24 ENCOUNTER — Ambulatory Visit: Payer: Self-pay

## 2023-11-24 ENCOUNTER — Ambulatory Visit: Admitting: Family Medicine

## 2023-11-24 ENCOUNTER — Other Ambulatory Visit: Payer: Self-pay

## 2023-11-24 ENCOUNTER — Encounter: Payer: Self-pay | Admitting: Family Medicine

## 2023-11-24 ENCOUNTER — Ambulatory Visit (INDEPENDENT_AMBULATORY_CARE_PROVIDER_SITE_OTHER): Admitting: Urology

## 2023-11-24 VITALS — BP 192/75 | HR 58 | Ht 71.0 in | Wt 221.0 lb

## 2023-11-24 VITALS — BP 174/70 | HR 86 | Temp 97.9°F | Wt 228.5 lb

## 2023-11-24 DIAGNOSIS — N401 Enlarged prostate with lower urinary tract symptoms: Secondary | ICD-10-CM

## 2023-11-24 DIAGNOSIS — N138 Other obstructive and reflux uropathy: Secondary | ICD-10-CM | POA: Diagnosis not present

## 2023-11-24 DIAGNOSIS — I10 Essential (primary) hypertension: Secondary | ICD-10-CM | POA: Diagnosis not present

## 2023-11-24 MED ORDER — FINASTERIDE 5 MG PO TABS: 5.0000 mg | ORAL_TABLET | Freq: Every day | ORAL | 2 refills | Status: DC

## 2023-11-24 MED ORDER — AMLODIPINE BESYLATE 5 MG PO TABS: 5.0000 mg | ORAL_TABLET | Freq: Every day | ORAL | 5 refills | Status: DC

## 2023-11-24 NOTE — Telephone Encounter (Signed)
  FYI Only or Action Required?: Action required by provider: clinical question for provider and update on patient condition.  Patient was last seen in primary care on 08/27/2023 by Johnny Garnette LABOR, MD.  Called Nurse Triage reporting Hypertension.  Symptoms began today.  Interventions attempted: Prescription medications: zestoretic .  Symptoms are: unchanged.  Triage Disposition: See HCP Within 4 Hours (Or PCP Triage)  Patient/caregiver understands and will follow disposition?: Yes  Copied from CRM #[id number]. Topic: Clinical - Red Word Triage >> Nov 24, 2023 11:38 AM Dedra NOVAK wrote: Red Word that prompted transfer to Nurse Triage: Pt was seen at urology this morning and systolic BP was over 200. Warm transfer to NT Reason for Disposition  [1] Systolic BP >= 200 OR Diastolic >= 120 AND [2] having NO cardiac or neurologic symptoms  Answer Assessment - Initial Assessment Questions Infrequently monitors at home, but runs 140's-150's /70's   1. BLOOD PRESSURE: What is your blood pressure? Did you take at least two measurements 5 minutes apart?     Blood pressure at urology 192/75, highest was 227/85 2. ONSET: When did you take your blood pressure?     Today at urology appointment 3. HOW: How did you take your blood pressure? (e.g., automatic home BP monitor, visiting nurse)     Both automatic and manual cuff 4. HISTORY: Do you have a history of high blood pressure?     Positive for history of hypertension 5. MEDICINES: Are you taking any medicines for blood pressure? Have you missed any doses recently?     Has not missed any doses of zestortic 6. OTHER SYMPTOMS: Do you have any symptoms? (e.g., blurred vision, chest pain, difficulty breathing, headache, weakness)     Denies all symptoms 7. PREGNANCY: Is there any chance you are pregnant? When was your last menstrual period?     N/a  Protocols used: Blood Pressure - High-A-AH

## 2023-11-24 NOTE — Progress Notes (Signed)
 Established Patient Office Visit  Subjective   Patient ID: Benjamin [name], male    DOB: [birth year]  Age: 83 y.o. MRN: [id number]  Chief Complaint  Patient presents with   Hypertension    HPI   Mr. Warga is seen for exacerbation of hypertension.  He has fairly longstanding history of hypertension and takes lisinopril  HCTZ 10/12.5 mg regularly.  Compliant with therapy.  He was down at his urologist office earlier today and had initial blood pressure reading of 217 systolic and repeat with automated cuff 201.  He recalls his manual reading was 182/75.  Denies any headaches or dizziness.  Had recent left total hip replacement about 6 weeks ago but recovering from that.  He started exercise program a couple days ago.  Denies any nonsteroidal use.  Does drink alcohol daily with usually 1 to 2 glasses of wine and usually small amount of vodka.  He is been doing this for years.  Other medical problems include history of hyperglycemia, dyslipidemia, BPH.  Denies any recent chest pains.  No dyspnea.  No peripheral edema.  Does apparently also take testosterone  replacement.  Past Medical History:  Diagnosis Date   Allergy    Anxiety    Arthritis    BPH (benign prostatic hyperplasia)    with elevated PSA, sees Dr. Matilda, benign   Cataract    Elevated PSA    sees Dr. Matilda    GERD (gastroesophageal reflux disease)    Hyperlipidemia    Hypertension    Insomnia    Platelets decreased    Pneumonia    walking PNA   Pre-diabetes    No meds   Self-catheterizes urinary bladder    Past Surgical History:  Procedure Laterality Date   CHOLECYSTECTOMY N/A 01/10/2014   Procedure: LAPAROSCOPIC CHOLECYSTECTOMY WITH INTRAOPERATIVE CHOLANGIOGRAM;  Surgeon: Donnice KATHEE Lunger, MD;  Location: [facility];  Service: General;  Laterality: N/A;   COLONOSCOPY  08/03/2018   per Dr. Avram, adenomatous polyps, no repeats needed    IR ANGIOGRAM PELVIS SELECTIVE OR SUPRASELECTIVE  01/13/2023   IR  ANGIOGRAM SELECTIVE EACH ADDITIONAL VESSEL  01/13/2023   IR EMBO TUMOR ORGAN ISCHEMIA INFARCT INC GUIDE ROADMAPPING  01/13/2023   IR RADIOLOGIST EVAL & MGMT  11/18/2022   IR US  GUIDE VASC ACCESS LEFT  01/13/2023   IR US  GUIDE VASC ACCESS RIGHT  01/13/2023   JOINT REPLACEMENT     Right hip arthroplasty Dr. Ernie 01/05/17    left knee arthroscopy      LIPOSUCTION  1987   prostate biopsies in 2003 and again 05/2008     per Dr. Matilda. benign   REFRACTIVE SURGERY Bilateral    cataract surgery   repair  of large umblicial hernia     per Dr. Adina Lunger on 01/10/10   right knee arthroscopy     TONSILLECTOMY     TOTAL HIP ARTHROPLASTY Right 01/05/2017   Procedure: RIGHT TOTAL HIP ARTHROPLASTY ANTERIOR APPROACH;  Surgeon: Ernie Donnice, MD;  Location: [facility];  Service: Orthopedics;  Laterality: Right;  70 mins   TOTAL HIP ARTHROPLASTY Left 10/12/2023   Procedure: ARTHROPLASTY, HIP, TOTAL, ANTERIOR APPROACH;  Surgeon: Ernie Donnice, MD;  Location: [facility];  Service: Orthopedics;  Laterality: Left;   TOTAL KNEE ARTHROPLASTY Left 07/16/2014   Procedure: LEFT TOTAL KNEE ARTHROPLASTY;  Surgeon: Donnice Ernie, MD;  Location: [facility];  Service: Orthopedics;  Laterality: Left;   TOTAL KNEE ARTHROPLASTY Right 06/16/2022   Procedure: TOTAL KNEE ARTHROPLASTY;  Surgeon:  Ernie Cough, MD;  Location: [facility];  Service: Orthopedics;  Laterality: Right;    reports that he quit smoking about 49 years ago. His smoking use included cigarettes. He has never used smokeless tobacco. He reports current alcohol use of about 14.0 standard drinks of alcohol per week. He reports current drug use. Drug: Marijuana. family history includes Alzheimer's disease in an other family member. Allergies  Allergen Reactions   Norco [Hydrocodone -Acetaminophen ]     Hallucinations with heavy doses    Meloxicam Other (See Comments)    Stomach upset    Review of Systems  Constitutional:  Negative for malaise/fatigue.  Eyes:  Negative  for blurred vision.  Respiratory:  Negative for shortness of breath.   Cardiovascular:  Negative for chest pain.  Neurological:  Negative for dizziness, weakness and headaches.      Objective:     BP (!) 174/70   Pulse 86   Temp 97.9 F (36.6 C) (Oral)   Wt 228 lb 8 oz (103.6 kg)   SpO2 95%   BMI 31.87 kg/m  BP Readings from Last 3 Encounters:  11/24/23 (!) 174/70  11/24/23 (!) 192/75  11/11/23 (!) 156/78   Wt Readings from Last 3 Encounters:  11/24/23 228 lb 8 oz (103.6 kg)  11/24/23 221 lb (100.2 kg)  11/11/23 221 lb (100.2 kg)      Physical Exam Vitals reviewed.  Constitutional:      General: He is not in acute distress.    Appearance: He is well-developed. He is not ill-appearing.  Eyes:     Pupils: Pupils are equal, round, and reactive to light.  Neck:     Thyroid : No thyromegaly.  Cardiovascular:     Rate and Rhythm: Normal rate and regular rhythm.  Pulmonary:     Effort: Pulmonary effort is normal. No respiratory distress.     Breath sounds: Normal breath sounds. No wheezing or rales.  Musculoskeletal:     Cervical back: Neck supple.     Right lower leg: No edema.     Left lower leg: No edema.  Neurological:     General: No focal deficit present.     Mental Status: He is alert and oriented to person, place, and time.      No results found for any visits on 11/24/23.  Last CBC Lab Results  Component Value Date   WBC 5.3 09/29/2023   HGB 16.6 09/29/2023   HCT 52.2 (H) 09/29/2023   MCV 94.4 09/29/2023   MCH 30.0 09/29/2023   RDW 14.9 09/29/2023   PLT 124 (L) 09/29/2023   Last metabolic panel Lab Results  Component Value Date   GLUCOSE 143 (H) 09/29/2023   NA 137 09/29/2023   K 4.1 09/29/2023   CL 102 09/29/2023   CO2 27 09/29/2023   BUN 17 09/29/2023   CREATININE 1.18 09/29/2023   GFRNONAA >60 09/29/2023   CALCIUM  9.3 09/29/2023   PROT 6.7 09/29/2023   ALBUMIN 3.9 09/29/2023   BILITOT 1.2 09/29/2023   ALKPHOS 54 09/29/2023   AST  20 09/29/2023   ALT 18 09/29/2023   ANIONGAP 8 09/29/2023   Last lipids Lab Results  Component Value Date   CHOL 157 02/26/2023   HDL 52.20 02/26/2023   LDLCALC 80 02/26/2023   LDLDIRECT 153.4 04/12/2012   TRIG 122.0 02/26/2023   CHOLHDL 3 02/26/2023   Last hemoglobin A1c Lab Results  Component Value Date   HGBA1C 5.9 02/26/2023      The ASCVD Risk  score (Arnett DK, et al., 2019) failed to calculate for the following reasons:   The 2019 ASCVD risk score is only valid for ages 81 to 53    Assessment & Plan:   Severe hypertension.  Patient already on lisinopril  HCTZ and had blood pressure reading earlier today of 217 systolic.  Currently blood pressure is 174/70 and this was identical to initial reading here earlier and did not come down any after several minutes of rest.  We suggested the following  -Handout on DASH diet given.  He admits to eating a fair amount of sodium.  Try to scale back to goal of 2500 mg of sodium per day - Avoid strenuous exercise until further stabilized - Discussed alcohol and recommend trying to keep daily wine intake 10 ounces or less - Continue lisinopril  HCTZ and add amlodipine  5 mg daily - Set up follow-up with Dr. Johnny in 2 to 3 weeks to reassess  Wolm Scarlet, MD

## 2023-11-24 NOTE — Patient Instructions (Signed)
 Set up follow up with Dr Johnny in about 2-3 weeks.  Start the Amlodipine  in addition to the Lisinopril .

## 2023-11-24 NOTE — Progress Notes (Signed)
   11/24/2023 11:26 AM   Benjamin [name] 1940/05/11 [id number]  Reason for visit: Urinary retention, discuss HOLEP  History: Followed by Dr. Carolee at [facility], initially seen by me July 2025 to discuss HOLEP, surgery was ultimately canceled as he had a hip surgery and needed to recover Prostate MRI July 2024 with 192 g prostate, PI-RADS 4 lesion, biopsy was negative Underwent prostate artery embolization December 2024, mild improvement in symptoms but ultimately developed recurrent retention and bladder currently managed with CIC 3-4 times daily, does not void spontaneously Uses [facility] for ED through [facility]  Physical Exam: BP (!) 192/75   Pulse (!) 58   Ht 5' 11 (1.803 m)   Wt 221 lb (100.2 kg)   BMI 30.82 kg/m    Imaging/labs: Prostate MRI July 2024 192 g prostate, PI-RADS 4 lesions x 2, biopsy was negative  Today: Interested in pursuing HOLEP, has recovered from hip surgery Catheterizing 3-4 times per day, does well with catheterization but would like to resume spontaneous voiding  Plan:   BPH with retention: Persistent retention despite prostate artery embolization December 2024, prostate volume was 192 g on prostate MRI, assuming 30% decrease in size after embolization prostate likely around 140g.  We discussed other options like robotic simple prostatectomy or ongoing catheterization and he desires HOLEP. We discussed the risks and benefits of HoLEP at length.  The procedure requires general anesthesia and takes 1 to 2 hours, and a holmium laser is used to enucleate the prostate and push this tissue into the bladder.  A morcellator is then used to remove this tissue, which is sent for pathology.  The vast majority(>95%) of patients are able to discharge the same day with a catheter in place for 2 to 3 days, and will follow-up in clinic for a voiding trial.  We specifically discussed the risks of bleeding, infection, retrograde ejaculation, temporary urgency and urge  incontinence, very low risk of long-term incontinence, urethral stricture/bladder neck contracture, pathologic evaluation of prostate tissue and possible detection of prostate cancer or other malignancy, and possible need for additional procedures. Schedule HOLEP(150g)   Benjamin JAYSON Burnet, MD  [facility] [address] [place], [place] [address] [phone]

## 2023-11-24 NOTE — Progress Notes (Signed)
 Surgical Physician Order Form [facility] [place]  Dr. Redell Burnet, MD  * Scheduling expectation : Next Available (cannot be on a 3 HOLEP day)  *Length of Case: 2.5 hours  *Clearance needed: no  *Anticoagulation Instructions: Hold all anticoagulants  *Aspirin  Instructions: Hold Aspirin   *Post-op visit Date/Instructions:  1-3 day cath removal  *Diagnosis: BPH w/urinary obstruction  *Procedure:  HOLEP (47350)   Additional orders: N/A  -Admit type: OUTpatient  -Anesthesia: General  -VTE Prophylaxis Standing Order SCD's       Other:   -Standing Lab Orders Per Anesthesia    Lab other: UA&Urine Culture  -Standing Test orders EKG/Chest x-ray per Anesthesia       Test other:   - Medications:  Ancef  2gm IV  -Other orders:  N/A

## 2023-11-24 NOTE — Patient Instructions (Signed)

## 2023-11-25 NOTE — Telephone Encounter (Signed)
 Pt was seen bu Dr Micheal on 11/24/23

## 2023-11-27 ENCOUNTER — Other Ambulatory Visit: Payer: Self-pay | Admitting: Family Medicine

## 2023-11-29 ENCOUNTER — Telehealth: Payer: Self-pay

## 2023-11-29 NOTE — Progress Notes (Signed)
    [facility] Surgical Posting Form  Surgery Date: Date: 01/10/2024  Surgeon: Dr. Redell Burnet, MD  Inpt ( No  )   Outpt (Yes)   Obs ( No  )   Diagnosis: N40.1, N13.8 Benign Prostatic Hyperplasia with Urinary Obstruction  -CPT: [phone]  Surgery: Holmium Laser Enucleation of the Prostate  Stop Anticoagulations: Yes and also hold ASA  Cardiac/Medical/Pulmonary Clearance needed: no  *Orders entered into EPIC  Date: 11/29/23   *Case booked in [place]  Date: 11/29/23  *Notified pt of Surgery: Date: 11/29/23  PRE-OP UA & CX: yes, will obtain in clinic on 12/28/2023  *Placed into Prior Authorization Work [place] Date: 11/29/23  Assistant/laser/rep:No

## 2023-11-29 NOTE — Telephone Encounter (Signed)
 Per Dr. Francisca, Patient is to be scheduled for Holmium Laser Enucleation of the Prostate   Benjamin [name] was contacted and possible surgical dates were discussed, Monday December 1st, 2025 was agreed upon for surgery.   Patient was instructed that Dr. Francisca will require them to provide a pre-op UA & CX prior to surgery. This was ordered and scheduled drop off appointment was made for 12/28/2023.    Patient was directed to call [phone] between 1-3pm the day before surgery to find out surgical arrival time.  Instructions were given not to eat or drink from midnight on the night before surgery and have a driver for the day of surgery. On the surgery day patient was instructed to enter through the Medical Mall entrance of [facility] report the Same Day Surgery desk.   Pre-Admit Testing will be in contact via phone to set up an interview with the anesthesia team to review your history and medications prior to surgery.   Reminder of this information was sent via MyChart to the patient.

## 2023-12-08 ENCOUNTER — Ambulatory Visit (INDEPENDENT_AMBULATORY_CARE_PROVIDER_SITE_OTHER): Admitting: Family Medicine

## 2023-12-08 ENCOUNTER — Encounter: Payer: Self-pay | Admitting: Family Medicine

## 2023-12-08 VITALS — BP 140/78 | HR 69 | Temp 98.0°F | Wt 231.0 lb

## 2023-12-08 DIAGNOSIS — E119 Type 2 diabetes mellitus without complications: Secondary | ICD-10-CM | POA: Diagnosis not present

## 2023-12-08 DIAGNOSIS — Z5189 Encounter for other specified aftercare: Secondary | ICD-10-CM | POA: Diagnosis not present

## 2023-12-08 DIAGNOSIS — E785 Hyperlipidemia, unspecified: Secondary | ICD-10-CM | POA: Diagnosis not present

## 2023-12-08 DIAGNOSIS — G629 Polyneuropathy, unspecified: Secondary | ICD-10-CM | POA: Diagnosis not present

## 2023-12-08 DIAGNOSIS — I10 Essential (primary) hypertension: Secondary | ICD-10-CM

## 2023-12-08 LAB — CBC WITH DIFFERENTIAL/PLATELET
Basophils Absolute: 0 K/uL (ref 0.0–0.1)
Basophils Relative: 0.7 % (ref 0.0–3.0)
Eosinophils Absolute: 0.1 K/uL (ref 0.0–0.7)
Eosinophils Relative: 2.2 % (ref 0.0–5.0)
HCT: 49.2 % (ref 39.0–52.0)
Hemoglobin: 16.3 g/dL (ref 13.0–17.0)
Lymphocytes Relative: 35.4 % (ref 12.0–46.0)
Lymphs Abs: 1.7 K/uL (ref 0.7–4.0)
MCHC: 33.2 g/dL (ref 30.0–36.0)
MCV: 92.6 fl (ref 78.0–100.0)
Monocytes Absolute: 0.6 K/uL (ref 0.1–1.0)
Monocytes Relative: 12.6 % — ABNORMAL HIGH (ref 3.0–12.0)
Neutro Abs: 2.3 K/uL (ref 1.4–7.7)
Neutrophils Relative %: 49.1 % (ref 43.0–77.0)
Platelets: 123 K/uL — ABNORMAL LOW (ref 150.0–400.0)
RBC: 5.32 Mil/uL (ref 4.22–5.81)
RDW: 16.5 % — ABNORMAL HIGH (ref 11.5–15.5)
WBC: 4.8 K/uL (ref 4.0–10.5)

## 2023-12-08 LAB — BASIC METABOLIC PANEL WITH GFR
BUN: 22 mg/dL (ref 6–23)
CO2: 29 meq/L (ref 19–32)
Calcium: 9.4 mg/dL (ref 8.4–10.5)
Chloride: 102 meq/L (ref 96–112)
Creatinine, Ser: 1.17 mg/dL (ref 0.40–1.50)
GFR: 57.94 mL/min — ABNORMAL LOW (ref >60.00)
Glucose, Bld: 107 mg/dL — ABNORMAL HIGH (ref 70–99)
Potassium: 3.8 meq/L (ref 3.5–5.1)
Sodium: 138 meq/L (ref 135–145)

## 2023-12-08 LAB — HEPATIC FUNCTION PANEL
ALT: 20 U/L (ref 0–53)
AST: 20 U/L (ref 0–37)
Albumin: 4.3 g/dL (ref 3.5–5.2)
Alkaline Phosphatase: 62 U/L (ref 39–117)
Bilirubin, Direct: 0.2 mg/dL (ref 0.0–0.3)
Total Bilirubin: 0.6 mg/dL (ref 0.2–1.2)
Total Protein: 6.9 g/dL (ref 6.0–8.3)

## 2023-12-08 LAB — TSH: TSH: 2.26 u[IU]/mL (ref 0.35–5.50)

## 2023-12-08 LAB — LIPID PANEL
Cholesterol: 158 mg/dL (ref 0–200)
HDL: 59 mg/dL (ref >39.00)
LDL Cholesterol: 80 mg/dL (ref 0–99)
NonHDL: 99.32
Total CHOL/HDL Ratio: 3
Triglycerides: 95 mg/dL (ref 0.0–149.0)
VLDL: 19 mg/dL (ref 0.0–40.0)

## 2023-12-08 LAB — HEMOGLOBIN A1C: Hgb A1c MFr Bld: 5.1 % (ref 4.6–6.5)

## 2023-12-08 MED ORDER — PREGABALIN 75 MG PO CAPS: 75.0000 mg | ORAL_CAPSULE | Freq: Every day | ORAL | 1 refills | Status: DC

## 2023-12-08 MED ORDER — AMLODIPINE BESYLATE 5 MG PO TABS: 5.0000 mg | ORAL_TABLET | Freq: Every day | ORAL | 3 refills | Status: DC

## 2023-12-08 NOTE — Progress Notes (Signed)
   Subjective:    Patient ID: Benjamin [name], male    DOB: [birth year], 83 y.o.   MRN: [id number]  HPI Here to follow up on HTN. He has been taking Lisinopril  HCT for several years with good BP control, but his BP has been going quite high for the past few week. He was getting systolic readings mostly in the 170's and 180's, although several readings were above 200. He was seen here 2 weeks ago, and Amlodipine  5 mg daily was added to his regimen. Since then he has felt fine and the BP has come down. Also he had a left hip replacement surgery on 10-12-23, and as a result he developed sciatic pains down the left leg. His surgeon, Dr. Ernie, started him on Pregabalin, and this has worked quite well to relieve these pains. He now takes this only at bedtime and he asks for some refills. He also asks to have some labs drawn today, including lipids and an A1c. He has been watching his diet closely.    Review of Systems  Constitutional: Negative.   Respiratory: Negative.    Cardiovascular: Negative.   Neurological: Negative.        Objective:   Physical Exam Constitutional:      Appearance: Normal appearance.  Cardiovascular:     Rate and Rhythm: Normal rate and regular rhythm.     Pulses: Normal pulses.     Heart sounds: Normal heart sounds.  Pulmonary:     Effort: Pulmonary effort is normal.     Breath sounds: Normal breath sounds.  Neurological:     Mental Status: He is alert.           Assessment & Plan:  His HTN is now under good control, and we will continue the daily Amlodipine  5 mg. 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 For his type 2 diabetes and dyslipidemia, we will get labs today.  Garnette Olmsted, MD

## 2023-12-09 ENCOUNTER — Ambulatory Visit: Payer: Self-pay | Admitting: Family Medicine

## 2023-12-12 ENCOUNTER — Other Ambulatory Visit: Payer: Self-pay | Admitting: Family Medicine

## 2023-12-23 ENCOUNTER — Ambulatory Visit: Payer: Self-pay

## 2023-12-23 NOTE — Telephone Encounter (Signed)
  FYI Only or Action Required?: FYI only for provider: appointment scheduled on 12/24/2023.  Patient was last seen in primary care on 12/08/2023 by Johnny Garnette LABOR, MD.  Called Nurse Triage reporting Urinary Frequency.  Symptoms began today.  Interventions attempted: Nothing.  Symptoms are: unchanged.  Triage Disposition: See Physician Within 24 Hours  Patient/caregiver understands and will follow disposition?: Yes Copied from CRM #[id number]. Topic: Clinical - Red Word Triage >> Dec 23, 2023  4:24 PM Drema MATSU wrote: Red Word that prompted transfer to Nurse Triage: Patient thinks he has a UTI. Patient feels like he has a UTI (Patient uses a catheter) and it wakes him up at night. Symptoms: urge to urinate frequently He is also requesting antibiotics. Reason for Disposition  Urinating more frequently than usual (i.e., frequency) OR new-onset of the feeling of an urgent need to urinate (i.e., urgency)  Answer Assessment - Initial Assessment Questions 1. SYMPTOM: What's the main symptom you're concerned about? (e.g., frequency, incontinence)     Urinary frequency 2. ONSET: When did the  *No Answer*  start?     *No Answer* 3. PAIN: Is there any pain? If Yes, ask: How bad is it? (Scale: 1-10; mild, moderate, severe)     denies 4. CAUSE: What do you think is causing the symptoms?     Possible UTI 5. OTHER SYMPTOMS: Do you have any other symptoms? (e.g., blood in urine, fever, flank pain, pain with urination)     denies 6. PREGNANCY: Is there any chance you are pregnant? When was your last menstrual period?     N/a  Protocols used: Urinary Symptoms-A-AH

## 2023-12-24 ENCOUNTER — Encounter: Payer: Self-pay | Admitting: Family Medicine

## 2023-12-24 ENCOUNTER — Ambulatory Visit (INDEPENDENT_AMBULATORY_CARE_PROVIDER_SITE_OTHER): Admitting: Family Medicine

## 2023-12-24 VITALS — BP 110/76 | HR 71 | Temp 97.6°F | Wt 234.0 lb

## 2023-12-24 DIAGNOSIS — N390 Urinary tract infection, site not specified: Secondary | ICD-10-CM | POA: Diagnosis not present

## 2023-12-24 LAB — POC URINALSYSI DIPSTICK (AUTOMATED)
Bilirubin, UA: NEGATIVE
Glucose, UA: NEGATIVE
Ketones, UA: NEGATIVE
Nitrite, UA: NEGATIVE
Protein, UA: POSITIVE — AB
Spec Grav, UA: 1.015 (ref 1.010–1.025)
Urobilinogen, UA: 0.2 U/dL
pH, UA: 6 (ref 5.0–8.0)

## 2023-12-24 MED ORDER — CIPROFLOXACIN HCL 500 MG PO TABS: 500.0000 mg | ORAL_TABLET | Freq: Two times a day (BID) | ORAL | 0 refills | Status: AC

## 2023-12-24 NOTE — Telephone Encounter (Signed)
 Pt was seen this morning by Dr Johnny for this problem.

## 2023-12-24 NOTE — Progress Notes (Signed)
   Subjective:    Patient ID: Benjamin [name], male    DOB: 11/10/1940, 83 y.o.   MRN: [id number]  HPI Here for 2 days of urinary frequency and urgency. No fever or pain. He is scheduled for a prostate enucleation procedure on 01-10-24.    Review of Systems  Constitutional: Negative.   Respiratory: Negative.    Cardiovascular: Negative.   Genitourinary:  Positive for frequency and urgency. Negative for dysuria, flank pain and hematuria.       Objective:   Physical Exam Constitutional:      Appearance: Normal appearance.  Cardiovascular:     Rate and Rhythm: Normal rate and regular rhythm.     Pulses: Normal pulses.     Heart sounds: Normal heart sounds.  Pulmonary:     Effort: Pulmonary effort is normal.     Breath sounds: Normal breath sounds.  Abdominal:     Tenderness: There is no right CVA tenderness or left CVA tenderness.  Neurological:     Mental Status: He is alert.           Assessment & Plan:  UTI, treat with 10 days of Cipro . Culture the sample.  Garnette Olmsted, MD

## 2023-12-24 NOTE — Addendum Note (Signed)
 Addended by: LADONNA INOCENTE SAILOR on: 12/24/2023 11:19 AM   Modules accepted: Orders

## 2023-12-25 LAB — URINE CULTURE
MICRO NUMBER:: 17237001
Result:: NO GROWTH
SPECIMEN QUALITY:: ADEQUATE

## 2023-12-27 ENCOUNTER — Ambulatory Visit: Payer: Self-pay | Admitting: Family Medicine

## 2023-12-28 ENCOUNTER — Other Ambulatory Visit

## 2023-12-28 DIAGNOSIS — N138 Other obstructive and reflux uropathy: Secondary | ICD-10-CM | POA: Diagnosis not present

## 2023-12-28 DIAGNOSIS — N401 Enlarged prostate with lower urinary tract symptoms: Secondary | ICD-10-CM | POA: Diagnosis not present

## 2023-12-28 LAB — URINALYSIS, COMPLETE
Glucose, UA: NEGATIVE
Nitrite, UA: NEGATIVE
RBC, UA: NEGATIVE
Specific Gravity, UA: 1.03 (ref 1.005–1.030)
Urobilinogen, Ur: 1 mg/dL (ref 0.2–1.0)
pH, UA: 5.5 (ref 5.0–7.5)

## 2023-12-28 LAB — MICROSCOPIC EXAMINATION: Epithelial Cells (non renal): >10 /HPF — AB (ref 0–10)

## 2023-12-31 LAB — CULTURE, URINE COMPREHENSIVE

## 2024-01-03 ENCOUNTER — Encounter
Admission: RE | Admit: 2024-01-03 | Discharge: 2024-01-03 | Disposition: A | Discharge status: Home / Self Care | Source: Ambulatory Visit | Attending: Urology | Admitting: Urology

## 2024-01-03 ENCOUNTER — Other Ambulatory Visit: Payer: Self-pay

## 2024-01-03 NOTE — Patient Instructions (Addendum)
 Your procedure is scheduled on: Monday 01/10/24 Report to the Registration Desk on the 1st floor of the Medical Mall. To find out your arrival time, please call [phone] between 1PM - 3PM on: Friday 01/07/24 If your arrival time is 6:00 am, do not arrive before that time as the Medical Mall entrance doors do not open until 6:00 am.  REMEMBER: Instructions that are not followed completely may result in serious medical risk, up to and including death; or upon the discretion of your surgeon and anesthesiologist your surgery may need to be rescheduled.  Do not eat food or drink fluids after midnight the night before surgery.  No gum chewing or hard candies.   One week prior to surgery: Stop Anti-inflammatories (NSAIDS) such as Advil, Aleve , Ibuprofen, Motrin, Naproxen , Naprosyn  and Aspirin  based products such as Excedrin, Goody's Powder, BC Powder. Stop ANY OVER THE COUNTER supplements until after surgery.  You may however, continue to take Tylenol  if needed for pain up until the day of surgery.  Continue taking all of your other prescription medications up until the day of surgery.  ON THE DAY OF SURGERY ONLY TAKE THESE MEDICATIONS WITH SIPS OF WATER :  ALPRAZolam  (XANAX ) 0.5 MG  amLODipine  (NORVASC ) 5 MG  omeprazole  (PRILOSEC ) 40 MG    No Alcohol for 24 hours before or after surgery.  No Smoking including e-cigarettes for 24 hours before surgery.  No chewable tobacco products for at least 6 hours before surgery.  No nicotine patches on the day of surgery.  Do not use any recreational drugs for at least a week (preferably 2 weeks) before your surgery.  Please be advised that the combination of cocaine and anesthesia may have negative outcomes, up to and including death. If you test positive for cocaine, your surgery will be cancelled.  On the morning of surgery brush your teeth with toothpaste and water , you may rinse your mouth with mouthwash if you wish. Do not swallow any  toothpaste or mouthwash.  Use CHG Soap or wipes as directed on instruction sheet.  Do not wear jewelry, make-up, hairpins, clips or nail polish.  For welded (permanent) jewelry: bracelets, anklets, waist bands, etc.  Please have this removed prior to surgery.  If it is not removed, there is a chance that hospital personnel will need to cut it off on the day of surgery.  Do not wear lotions, powders, or perfumes.   Do not shave body hair from the neck down 48 hours before surgery.  Contact lenses, hearing aids and dentures may not be worn into surgery.  Do not bring valuables to the hospital. [facility] is not responsible for any missing/lost belongings or valuables.   Bring your C-PAP to the hospital in case you may have to spend the night.   Notify your doctor if there is any change in your medical condition (cold, fever, infection).  Wear comfortable clothing (specific to your surgery type) to the hospital.  After surgery, you can help prevent lung complications by doing breathing exercises.  Take deep breaths and cough every 1-2 hours. Your doctor may order a device called an Incentive Spirometer to help you take deep breaths. When coughing or sneezing, hold a pillow firmly against your incision with both hands. This is called "splinting." Doing this helps protect your incision. It also decreases belly discomfort.  If you are being admitted to the hospital overnight, leave your suitcase in the car. After surgery it may be brought to your room.  In  case of increased patient census, it may be necessary for you, the patient, to continue your postoperative care in the Same Day Surgery department.  If you are being discharged the day of surgery, you will not be allowed to drive home. You will need a responsible individual to drive you home and stay with you for 24 hours after surgery.   If you are taking public transportation, you will need to have a responsible individual with  you.  Please call the Pre-admissions Testing Dept. at [phone] if you have any questions about these instructions.  Surgery Visitation Policy:  Patients having surgery or a procedure may have two visitors.  Children under the age of 82 must have an adult with them who is not the patient.  Inpatient Visitation:    Visiting hours are 7 a.m. to 8 p.m. Up to four visitors are allowed at one time in a patient room. The visitors may rotate out with other people during the day.  One visitor age 82 or older may stay with the patient overnight and must be in the room by 8 p.m.   Merchandiser, Retail to address health-related social needs:  [email or web address]

## 2024-01-05 ENCOUNTER — Other Ambulatory Visit: Payer: Self-pay | Admitting: Family Medicine

## 2024-01-10 ENCOUNTER — Ambulatory Visit

## 2024-01-10 ENCOUNTER — Ambulatory Visit
Admission: RE | Admit: 2024-01-10 | Discharge: 2024-01-10 | Disposition: A | Discharge status: Home / Self Care | Attending: Urology | Admitting: Urology

## 2024-01-10 ENCOUNTER — Other Ambulatory Visit: Payer: Self-pay

## 2024-01-10 ENCOUNTER — Encounter
Admission: RE | Disposition: A | Discharge status: Home / Self Care | Payer: Self-pay | Source: Home / Self Care | Attending: Urology

## 2024-01-10 ENCOUNTER — Encounter: Payer: Self-pay | Admitting: Urology

## 2024-01-10 DIAGNOSIS — I10 Essential (primary) hypertension: Secondary | ICD-10-CM | POA: Diagnosis not present

## 2024-01-10 DIAGNOSIS — N401 Enlarged prostate with lower urinary tract symptoms: Secondary | ICD-10-CM

## 2024-01-10 DIAGNOSIS — C61 Malignant neoplasm of prostate: Secondary | ICD-10-CM

## 2024-01-10 DIAGNOSIS — N138 Other obstructive and reflux uropathy: Secondary | ICD-10-CM | POA: Diagnosis not present

## 2024-01-10 DIAGNOSIS — E119 Type 2 diabetes mellitus without complications: Secondary | ICD-10-CM | POA: Diagnosis not present

## 2024-01-10 DIAGNOSIS — R338 Other retention of urine: Secondary | ICD-10-CM

## 2024-01-10 HISTORY — PX: HOLEP-LASER ENUCLEATION OF THE PROSTATE WITH MORCELLATION: SHX6641

## 2024-01-10 SURGERY — ENUCLEATION, PROSTATE, USING LASER, WITH MORCELLATION
Anesthesia: General

## 2024-01-10 MED ORDER — OXYCODONE HCL 5 MG PO TABS
ORAL_TABLET | ORAL | Status: AC
  Filled 2024-01-10: qty 1

## 2024-01-10 MED ORDER — FENTANYL CITRATE (PF) 100 MCG/2ML IJ SOLN
INTRAMUSCULAR | Status: DC | PRN
  Administered 2024-01-10 (×4): 50 ug via INTRAVENOUS

## 2024-01-10 MED ORDER — ROCURONIUM BROMIDE 10 MG/ML (PF) SYRINGE
PREFILLED_SYRINGE | INTRAVENOUS | Status: AC
  Filled 2024-01-10: qty 10

## 2024-01-10 MED ORDER — FENTANYL CITRATE (PF) 100 MCG/2ML IJ SOLN
INTRAMUSCULAR | Status: AC
  Filled 2024-01-10: qty 2

## 2024-01-10 MED ORDER — PROPOFOL 10 MG/ML IV BOLUS
INTRAVENOUS | Status: DC | PRN
  Administered 2024-01-10: 180 mg via INTRAVENOUS

## 2024-01-10 MED ORDER — DEXAMETHASONE SOD PHOSPHATE PF 10 MG/ML IJ SOLN
INTRAMUSCULAR | Status: DC | PRN
  Administered 2024-01-10: 5 mg via INTRAVENOUS

## 2024-01-10 MED ORDER — LIDOCAINE HCL (PF) 2 % IJ SOLN
INTRAMUSCULAR | Status: AC
  Filled 2024-01-10: qty 5

## 2024-01-10 MED ORDER — FENTANYL CITRATE (PF) 100 MCG/2ML IJ SOLN: 25.0000 ug | INTRAMUSCULAR | Status: DC | PRN

## 2024-01-10 MED ORDER — PHENYLEPHRINE 80 MCG/ML (10ML) SYRINGE FOR IV PUSH (FOR BLOOD PRESSURE SUPPORT)
PREFILLED_SYRINGE | INTRAVENOUS | Status: AC
  Filled 2024-01-10: qty 10

## 2024-01-10 MED ORDER — ORAL CARE MOUTH RINSE: 15.0000 mL | Freq: Once | OROMUCOSAL | Status: AC

## 2024-01-10 MED ORDER — SODIUM CHLORIDE 0.9 % IR SOLN
Status: DC | PRN
  Administered 2024-01-10 (×3): 3000 mL
  Administered 2024-01-10: 12000 mL
  Administered 2024-01-10: 3000 mL

## 2024-01-10 MED ORDER — ONDANSETRON HCL 4 MG/2ML IJ SOLN
INTRAMUSCULAR | Status: AC
  Filled 2024-01-10: qty 2

## 2024-01-10 MED ORDER — CEFAZOLIN SODIUM-DEXTROSE 2-4 GM/100ML-% IV SOLN
INTRAVENOUS | Status: AC
  Filled 2024-01-10: qty 100

## 2024-01-10 MED ORDER — ACETAMINOPHEN 10 MG/ML IV SOLN
INTRAVENOUS | Status: AC
  Filled 2024-01-10: qty 100

## 2024-01-10 MED ORDER — PROPOFOL 10 MG/ML IV BOLUS
INTRAVENOUS | Status: AC
  Filled 2024-01-10: qty 20

## 2024-01-10 MED ORDER — ROCURONIUM BROMIDE 100 MG/10ML IV SOLN
INTRAVENOUS | Status: DC | PRN
  Administered 2024-01-10: 5 mg via INTRAVENOUS
  Administered 2024-01-10: 60 mg via INTRAVENOUS
  Administered 2024-01-10: 10 mg via INTRAVENOUS

## 2024-01-10 MED ORDER — OXYCODONE HCL 5 MG/5ML PO SOLN: 5.0000 mg | Freq: Once | ORAL | Status: AC | PRN

## 2024-01-10 MED ORDER — TRAMADOL HCL 50 MG PO TABS: 25.0000 mg | ORAL_TABLET | Freq: Four times a day (QID) | ORAL | 0 refills | Status: AC | PRN

## 2024-01-10 MED ORDER — DEXMEDETOMIDINE HCL IN NACL 80 MCG/20ML IV SOLN
INTRAVENOUS | Status: DC | PRN
  Administered 2024-01-10: 4 ug via INTRAVENOUS

## 2024-01-10 MED ORDER — GLYCOPYRROLATE 0.2 MG/ML IJ SOLN
INTRAMUSCULAR | Status: AC
  Filled 2024-01-10: qty 1

## 2024-01-10 MED ORDER — ACETAMINOPHEN 10 MG/ML IV SOLN
INTRAVENOUS | Status: DC | PRN
  Administered 2024-01-10: 1000 mg via INTRAVENOUS

## 2024-01-10 MED ORDER — ONDANSETRON HCL 4 MG/2ML IJ SOLN: 4.0000 mg | Freq: Once | INTRAMUSCULAR | Status: DC | PRN

## 2024-01-10 MED ORDER — LIDOCAINE HCL (CARDIAC) PF 100 MG/5ML IV SOSY
PREFILLED_SYRINGE | INTRAVENOUS | Status: DC | PRN
  Administered 2024-01-10: 80 mg via INTRAVENOUS

## 2024-01-10 MED ORDER — CEFAZOLIN SODIUM-DEXTROSE 2-4 GM/100ML-% IV SOLN
2.0000 g | INTRAVENOUS | Status: AC
  Administered 2024-01-10: 2 g via INTRAVENOUS

## 2024-01-10 MED ORDER — CHLORHEXIDINE GLUCONATE 0.12 % MT SOLN
OROMUCOSAL | Status: AC
  Filled 2024-01-10: qty 15

## 2024-01-10 MED ORDER — LACTATED RINGERS IV SOLN: INTRAVENOUS | Status: DC

## 2024-01-10 MED ORDER — OXYCODONE HCL 5 MG PO TABS
5.0000 mg | ORAL_TABLET | Freq: Once | ORAL | Status: AC | PRN
  Administered 2024-01-10: 5 mg via ORAL

## 2024-01-10 MED ORDER — CHLORHEXIDINE GLUCONATE 0.12 % MT SOLN
15.0000 mL | Freq: Once | OROMUCOSAL | Status: AC
  Administered 2024-01-10: 15 mL via OROMUCOSAL

## 2024-01-10 MED ORDER — ONDANSETRON HCL 4 MG/2ML IJ SOLN
INTRAMUSCULAR | Status: DC | PRN
  Administered 2024-01-10: 4 mg via INTRAVENOUS

## 2024-01-10 MED ORDER — SUGAMMADEX SODIUM 200 MG/2ML IV SOLN
INTRAVENOUS | Status: DC | PRN
  Administered 2024-01-10: 200 mg via INTRAVENOUS

## 2024-01-10 SURGICAL SUPPLY — 23 items
BAG URO DRAIN 4000ML (MISCELLANEOUS) ×1 IMPLANT
CATH URETL OPEN END 4X70 (CATHETERS) ×1 IMPLANT
CATH URTH STD 24FR FL 3W 2 (CATHETERS) ×1 IMPLANT
CONTAINER COLLECT MORCELLATR (MISCELLANEOUS) ×1 IMPLANT
DRAPE UTILITY 15X26 TOWEL STRL (DRAPES) IMPLANT
FIBER LASER MOSES 550 DFL (Laser) ×1 IMPLANT
FILTER OVERFLOW MORCELLATOR (FILTER) ×1 IMPLANT
GLOVE BIOGEL PI IND STRL 7.5 (GLOVE) ×2 IMPLANT
GOWN STRL REUS W/ TWL LRG LVL3 (GOWN DISPOSABLE) ×1 IMPLANT
GOWN STRL REUS W/ TWL XL LVL3 (GOWN DISPOSABLE) ×1 IMPLANT
HOLDER FOLEY CATH W/STRAP (MISCELLANEOUS) ×1 IMPLANT
KIT TURNOVER CYSTO (KITS) ×1 IMPLANT
MEMBRANE SLNG YLW 17 FOR INST (MISCELLANEOUS) ×1 IMPLANT
MORCELLATOR ROTATION 4.75 335 (MISCELLANEOUS) ×1 IMPLANT
PACK CYSTO AR (MISCELLANEOUS) ×1 IMPLANT
SET IRRIG Y-TYPE CYSTO (SET/KITS/TRAYS/PACK) ×1 IMPLANT
SLEEVE PROTECTION STRL DISP (MISCELLANEOUS) ×2 IMPLANT
SOL .9 NS 3000ML IRR UROMATIC (IV SOLUTION) ×5 IMPLANT
SOLN STERILE WATER BTL 1000 ML (IV SOLUTION) ×1 IMPLANT
SURGILUBE 2OZ TUBE FLIPTOP (MISCELLANEOUS) ×1 IMPLANT
SYRINGE TOOMEY IRRIG 70ML (MISCELLANEOUS) ×1 IMPLANT
TUBE PUMP MORCELLATOR PIRANHA (TUBING) ×1 IMPLANT
TUBING THERMACLEAR UROLOGY (TUBING) IMPLANT

## 2024-01-10 NOTE — H&P (Signed)
 01/10/24 9:13 AM   Benjamin [name] October 25, 1940 [id number]  CC: BPH with retention  HPI: 83 year old male has been followed by Dr. Carolee at [facility] urology for BPH and retention, prostate MRI July 2024 192 g prostate, PI-RADS 4 lesion biopsy was negative.  Underwent prostate artery embolization December 2024, but has had persistent retention requiring intermittent catheterization 3-4 times daily.  Uses [facility] for ED.  Opted for HOLEP to try to resume spontaneous voiding.   PMH: Past Medical History:  Diagnosis Date   Allergy    Anxiety    Arthritis    BPH (benign prostatic hyperplasia)    with elevated PSA, sees Dr. Matilda, benign   Cataract    Elevated PSA    sees Dr. Matilda    GERD (gastroesophageal reflux disease)    Hyperlipidemia    Hypertension    Insomnia    Platelets decreased    Pneumonia    walking PNA   Pre-diabetes    No meds   Self-catheterizes urinary bladder     Surgical History: Past Surgical History:  Procedure Laterality Date   CHOLECYSTECTOMY N/A 01/10/2014   Procedure: LAPAROSCOPIC CHOLECYSTECTOMY WITH INTRAOPERATIVE CHOLANGIOGRAM;  Surgeon: Donnice KATHEE Lunger, MD;  Location: [facility];  Service: General;  Laterality: N/A;   COLONOSCOPY  08/03/2018   per Dr. Avram, adenomatous polyps, no repeats needed    EYE SURGERY  2023   IR ANGIOGRAM PELVIS SELECTIVE OR SUPRASELECTIVE  01/13/2023   IR ANGIOGRAM SELECTIVE EACH ADDITIONAL VESSEL  01/13/2023   IR EMBO TUMOR ORGAN ISCHEMIA INFARCT INC GUIDE ROADMAPPING  01/13/2023   IR RADIOLOGIST EVAL & MGMT  11/18/2022   IR US  GUIDE VASC ACCESS LEFT  01/13/2023   IR US  GUIDE VASC ACCESS RIGHT  01/13/2023   JOINT REPLACEMENT     Right hip arthroplasty Dr. Ernie 01/05/17    left knee arthroscopy      LIPOSUCTION  1987   prostate biopsies in 2003 and again 05/2008     per Dr. Matilda. benign   REFRACTIVE SURGERY Bilateral    cataract surgery   repair  of large umblicial hernia     per Dr. Adina Lunger  on 01/10/10   right knee arthroscopy     TONSILLECTOMY     TOTAL HIP ARTHROPLASTY Right 01/05/2017   Procedure: RIGHT TOTAL HIP ARTHROPLASTY ANTERIOR APPROACH;  Surgeon: Ernie Donnice, MD;  Location: [facility];  Service: Orthopedics;  Laterality: Right;  70 mins   TOTAL HIP ARTHROPLASTY Left 10/12/2023   Procedure: ARTHROPLASTY, HIP, TOTAL, ANTERIOR APPROACH;  Surgeon: Ernie Donnice, MD;  Location: [facility];  Service: Orthopedics;  Laterality: Left;   TOTAL KNEE ARTHROPLASTY Left 07/16/2014   Procedure: LEFT TOTAL KNEE ARTHROPLASTY;  Surgeon: Donnice Ernie, MD;  Location: [facility];  Service: Orthopedics;  Laterality: Left;   TOTAL KNEE ARTHROPLASTY Right 06/16/2022   Procedure: TOTAL KNEE ARTHROPLASTY;  Surgeon: Ernie Donnice, MD;  Location: [facility];  Service: Orthopedics;  Laterality: Right;      Family History: Family History  Problem Relation Age of Onset   Alzheimer's disease Other    Colon cancer Neg Hx    Esophageal cancer Neg Hx    Pancreatic cancer Neg Hx    Prostate cancer Neg Hx    Rectal cancer Neg Hx    Stomach cancer Neg Hx     Social History:  reports that he quit smoking about 49 years ago. His smoking use included cigarettes. He has never used smokeless tobacco. He reports  current alcohol use of about 14.0 standard drinks of alcohol per week. He reports current drug use. Drug: Marijuana.  Physical Exam: BP (!) 171/85 (BP Location: Left Arm)   Pulse 73   Temp (!) 97.2 F (36.2 C) (Temporal)   Resp 15   Ht 5' 11.75 (1.822 m)   Wt 102.1 kg   SpO2 95%   BMI 30.73 kg/m    Constitutional:  Alert and oriented, No acute distress. Cardiovascular: Regular rate and rhythm Respiratory: Clear to auscultation bilaterally GI: Abdomen is soft, nontender, nondistended, no abdominal masses   Laboratory Data: Urine culture 11/18 no growth  Assessment & Plan:   83 year old male with BPH and Foley dependent retention, benign prostate biopsy, prostate artery embolization December  2024 with persistent retention requiring intermittent catheterization.  We discussed the risks and benefits of HoLEP at length.  The procedure requires general anesthesia and takes 1 to 2 hours, and a holmium laser is used to enucleate the prostate and push this tissue into the bladder.  A morcellator is then used to remove this tissue, which is sent for pathology.  The vast majority(>95%) of patients are able to discharge the same day with a catheter in place for 2 to 3 days, and will follow-up in clinic for a voiding trial.  We specifically discussed the risks of bleeding, infection, retrograde ejaculation, temporary urgency and urge incontinence, very low risk of long-term incontinence, urethral stricture/bladder neck contracture, pathologic evaluation of prostate tissue and possible detection of prostate cancer or other malignancy, and possible need for additional procedures.  We discussed 90% chance of resuming spontaneous voiding after HOLEP.  HOLEP today  Redell Burnet, MD 01/10/2024  [facility] [address] [place], [place] [address] [phone]

## 2024-01-10 NOTE — Op Note (Signed)
 Date of procedure: 01/10/24  Preoperative diagnosis:  BPH with [name]  Postoperative diagnosis:  Same  Procedure: HoLEP (Holmium Laser Enucleation of the Prostate)  Surgeon: Redell Burnet, MD  Anesthesia: General  Complications: None  Intraoperative findings:  Massive prostate, significantly elevated bladder neck, challenging to get scope into the bladder Ureteral orifices close to bladder neck, verumontanum intact Excellent hemostasis  EBL: Minimal  Specimens: Prostate chips  Enucleation time: 41 minutes  Morcellation time: 13 minutes  Intra-op weight: 114g  Drains: 24 French three-way, 60 cc in balloon  Indication: Benjamin [name] is a 83 y.o. patient with BPH and [name], prior history of prostate artery embolization with persistent [name] requiring catheterization who opted for HOLEP to try to resume spontaneous voiding.  After reviewing the management options for treatment, they elected to proceed with the above surgical procedure(s). We have discussed the potential benefits and risks of the procedure, side effects of the proposed treatment, the likelihood of the patient achieving the goals of the procedure, and any potential problems that might occur during the procedure or recuperation.  We specifically discussed the risks of bleeding, infection, hematuria and clot [name], need for additional procedures, possible overnight hospital stay, temporary urgency and incontinence, rare long-term incontinence, and retrograde ejaculation.  Informed consent has been obtained.   Description of procedure:  The patient was taken to the operating room and general anesthesia was induced.  The patient was placed in the dorsal lithotomy position, prepped and draped in the usual sterile fashion, and preoperative antibiotics were administered.  SCDs were placed for DVT prophylaxis.  A preoperative time-out was performed.   Benjamin [name] sounds were used to gently dilated the  urethra up to 48F. The 49 French continuous flow resectoscope was inserted into the urethra using the visual obturator  The prostate was large with obstructing lateral lobes, significantly elevated bladder neck.  Very challenging just to get the scope into the bladder. The bladder was thoroughly inspected and notable for moderate trabeculations but no suspicious lesions.  The ureteral orifices were located in orthotopic position, close to the bladder neck.    The laser was set to 2 J and 60 Hz and early apical release was performed by making a circumferential mucosal incision proximal to the sphincter.  A lambda incision was then made proximal to the verumontanum.  The prostate was enucleated en bloc circumferentially into the bladder.  The capsule was examined and laser was used for meticulous hemostasis.    The 75 French resectoscope was then switched out for the 26 French nephroscope and prostate tissue was morcellated(Piranha) and the tissue sent to pathology.  A 24 French three-way catheter was inserted easily with the aid of a catheter guide, and 60 cc were placed in the balloon.  Urine was clear.  The catheter irrigated easily with a Toomey syringe.  CBI was initiated.   The patient tolerated the procedure well without any immediate complications and was extubated and transferred to the recovery room in stable condition.  Urine was clear on fast CBI.  Disposition: Stable to PACU  Plan: Wean CBI in PACU, anticipate discharge home today with Foley removal in clinic in 2-3 days  Redell Burnet, MD 01/10/2024

## 2024-01-10 NOTE — Anesthesia Procedure Notes (Signed)
 Procedure Name: Intubation Date/Time: 01/10/2024 9:32 AM  Performed by: Demarus Latterell, CRNAPre-anesthesia Checklist: Patient identified, Emergency Drugs available, Suction available and Patient being monitored Patient Re-evaluated:Patient Re-evaluated prior to induction Oxygen Delivery Method: Circle System Utilized Preoxygenation: Pre-oxygenation with 100% oxygen Induction Type: IV induction Ventilation: Mask ventilation without difficulty and Oral airway inserted - appropriate to patient size Laryngoscope Size: McGrath and 4 Grade View: Grade I Tube type: Oral Tube size: 7.5 mm Number of attempts: 1 Airway Equipment and Method: Stylet and Oral airway Placement Confirmation: ETT inserted through vocal cords under direct vision, positive ETCO2 and breath sounds checked- equal and bilateral Secured at: 22 cm Tube secured with: Tape Dental Injury: Teeth and Oropharynx as per pre-operative assessment  Comments: Easy, atraumatic intubation, lips, teeth and tongue unchanged. Head and neck midline.

## 2024-01-10 NOTE — Anesthesia Preprocedure Evaluation (Signed)
 Anesthesia Evaluation  Patient identified by MRN, date of birth, ID band Patient awake    Reviewed: Allergy & Precautions, NPO status , Patient's Chart, lab work & pertinent test results  Airway Mallampati: II  TM Distance: >3 FB Neck ROM: Full    Dental  (+) Teeth Intact   Pulmonary neg pulmonary ROS, Patient abstained from smoking., former smoker   Pulmonary exam normal        Cardiovascular Exercise Tolerance: Good hypertension, Pt. on medications negative cardio ROS Normal cardiovascular exam Rhythm:Regular Rate:Normal     Neuro/Psych   Anxiety     negative neurological ROS  negative psych ROS   GI/Hepatic negative GI ROS, Neg liver ROS,GERD  Medicated,,  Endo/Other  negative endocrine ROSdiabetes, Type 2, Oral Hypoglycemic Agents  Class 3 obesity  Renal/GU negative Renal ROS  negative genitourinary   Musculoskeletal  (+) Arthritis ,    Abdominal  (+) + obese  Peds negative pediatric ROS (+)  Hematology negative hematology ROS (+)   Anesthesia Other Findings Past Medical History: No date: Allergy No date: Anxiety No date: Arthritis No date: BPH (benign prostatic hyperplasia)     Comment:  with elevated PSA, sees Dr. Matilda, benign No date: Cataract No date: Elevated PSA     Comment:  sees Dr. Matilda  No date: GERD (gastroesophageal reflux disease) No date: Hyperlipidemia No date: Hypertension No date: Insomnia No date: Platelets decreased No date: Pneumonia     Comment:  walking PNA No date: Pre-diabetes     Comment:  No meds No date: Self-catheterizes urinary bladder  Past Surgical History: 01/10/2014: CHOLECYSTECTOMY; N/A     Comment:  Procedure: LAPAROSCOPIC CHOLECYSTECTOMY WITH               INTRAOPERATIVE CHOLANGIOGRAM;  Surgeon: Donnice KATHEE Lunger,              MD;  Location: [facility];  Service: General;  Laterality:               N/A; 08/03/2018: COLONOSCOPY     Comment:  per Dr.  Avram, adenomatous polyps, no repeats needed  2023: EYE SURGERY 01/13/2023: IR ANGIOGRAM PELVIS SELECTIVE OR SUPRASELECTIVE 01/13/2023: IR ANGIOGRAM SELECTIVE EACH ADDITIONAL VESSEL 01/13/2023: IR EMBO TUMOR ORGAN ISCHEMIA INFARCT INC GUIDE ROADMAPPING 11/18/2022: IR RADIOLOGIST EVAL & MGMT 01/13/2023: IR US  GUIDE VASC ACCESS LEFT 01/13/2023: IR US  GUIDE VASC ACCESS RIGHT No date: JOINT REPLACEMENT     Comment:  Right hip arthroplasty Dr. Ernie 01/05/17  No date: left knee arthroscopy  1987: LIPOSUCTION No date: prostate biopsies in 2003 and again 05/2008     Comment:  per Dr. Matilda. benign No date: REFRACTIVE SURGERY; Bilateral     Comment:  cataract surgery No date: repair  of large umblicial hernia     Comment:  per Dr. Adina Lunger on 01/10/10 No date: right knee arthroscopy No date: TONSILLECTOMY 01/05/2017: TOTAL HIP ARTHROPLASTY; Right     Comment:  Procedure: RIGHT TOTAL HIP ARTHROPLASTY ANTERIOR               APPROACH;  Surgeon: Ernie Donnice, MD;  Location: [facility];              Service: Orthopedics;  Laterality: Right;  70 mins 10/12/2023: TOTAL HIP ARTHROPLASTY; Left     Comment:  Procedure: ARTHROPLASTY, HIP, TOTAL, ANTERIOR APPROACH;               Surgeon: Ernie Donnice, MD;  Location: [facility];  Service:  Orthopedics;  Laterality: Left; 07/16/2014: TOTAL KNEE ARTHROPLASTY; Left     Comment:  Procedure: LEFT TOTAL KNEE ARTHROPLASTY;  Surgeon:               Donnice Car, MD;  Location: [facility];  Service:               Orthopedics;  Laterality: Left; 06/16/2022: TOTAL KNEE ARTHROPLASTY; Right     Comment:  Procedure: TOTAL KNEE ARTHROPLASTY;  Surgeon: Car Donnice, MD;  Location: [facility];  Service: Orthopedics;                Laterality: Right;  BMI    Body Mass Index: 30.73 kg/m      Reproductive/Obstetrics negative OB ROS                              Anesthesia Physical Anesthesia Plan  ASA:  3  Anesthesia Plan: General   Post-op Pain Management:    Induction: Intravenous  PONV Risk Score and Plan: Ondansetron , Dexamethasone , Midazolam  and Treatment may vary due to age or medical condition  Airway Management Planned: Oral ETT  Additional Equipment:   Intra-op Plan:   Post-operative Plan: Extubation in OR  Informed Consent: I have reviewed the patients History and Physical, chart, labs and discussed the procedure including the risks, benefits and alternatives for the proposed anesthesia with the patient or authorized representative who has indicated his/her understanding and acceptance.     Dental Advisory Given  Plan Discussed with: CRNA  Anesthesia Plan Comments:          Anesthesia Quick Evaluation

## 2024-01-10 NOTE — Anesthesia Postprocedure Evaluation (Signed)
 Anesthesia Post Note  Patient: Benjamin [name]  Procedure(s) Performed: ENUCLEATION, PROSTATE, USING LASER, WITH MORCELLATION  Patient location during evaluation: PACU Anesthesia Type: General Level of consciousness: awake Pain management: satisfactory to patient Vital Signs Assessment: post-procedure vital signs reviewed and stable Respiratory status: spontaneous breathing Cardiovascular status: stable Anesthetic complications: no   No notable events documented.   Last Vitals:  Vitals:   01/10/24 1159 01/10/24 1200  BP:  (!) 153/70  Pulse:  72  Resp: 15 15  Temp: 36.4 C   SpO2:  94%    Last Pain:  Vitals:   01/10/24 1200  TempSrc:   PainSc: 0-No pain                 VAN STAVEREN,Mykel Mohl

## 2024-01-10 NOTE — Transfer of Care (Signed)
 Immediate Anesthesia Transfer of Care Note  Patient: Benjamin [name]  Procedure(s) Performed: ENUCLEATION, PROSTATE, USING LASER, WITH MORCELLATION  Patient Location: PACU  Anesthesia Type:General  Level of Consciousness: awake, alert , and oriented  Airway & Oxygen Therapy: Patient Spontanous Breathing and Patient connected to face mask oxygen  Post-op Assessment: Report given to RN and Post -op Vital signs reviewed and stable  Post vital signs: Reviewed and stable  Last Vitals:  Vitals Value Taken Time  BP 136/65 01/10/24 10:51  Temp    Pulse 63 01/10/24 10:55  Resp 0 01/10/24 10:55  SpO2 99 % 01/10/24 10:55  Vitals shown include unfiled device data.  Last Pain:  Vitals:   01/10/24 0840  TempSrc: Temporal  PainSc: 0-No pain         Complications: No notable events documented.

## 2024-01-11 ENCOUNTER — Encounter: Payer: Self-pay | Admitting: Urology

## 2024-01-12 ENCOUNTER — Ambulatory Visit: Payer: Self-pay

## 2024-01-12 ENCOUNTER — Ambulatory Visit: Admitting: Physician Assistant

## 2024-01-12 ENCOUNTER — Encounter: Payer: Self-pay | Admitting: Physician Assistant

## 2024-01-12 VITALS — BP 174/76 | HR 85 | Temp 98.0°F | Ht 71.75 in | Wt 225.0 lb

## 2024-01-12 DIAGNOSIS — N401 Enlarged prostate with lower urinary tract symptoms: Secondary | ICD-10-CM

## 2024-01-12 DIAGNOSIS — N138 Other obstructive and reflux uropathy: Secondary | ICD-10-CM

## 2024-01-12 LAB — SURGICAL PATHOLOGY

## 2024-01-12 NOTE — Telephone Encounter (Signed)
 Noted

## 2024-01-12 NOTE — Patient Instructions (Signed)

## 2024-01-12 NOTE — Progress Notes (Signed)
 Catheter Removal  Patient is present today for a catheter removal.  56ml of water  was drained from the balloon. A 24FR three-way foley cath was removed from the bladder, no complications were noted. Patient tolerated well.  Performed by: Doha Boling, PA-C   Additional notes: Counseled patient on normal postoperative findings including dysuria, gross hematuria, and urinary urgency/leakage. Counseled patient to begin Kegel exercises 3x10 sets daily to increase urinary control and wear absorbent products as needed for security. Written and verbal resources provided today. Surgical pathology with a small focus of Gleason 3+3 prostate cancer involving <5% of the sample. Results shared with patient.  Follow up: Return in about 4 months (around 05/12/2024) for Postop f/u with Dr. Francisca with PSA prior.

## 2024-01-12 NOTE — Telephone Encounter (Signed)
 FYI Only or Action Required?: FYI only for provider: appointment scheduled on 12/5.  Patient was last seen in primary care on 12/24/2023 by Benjamin [name] LABOR, MD.  Called Nurse Triage reporting Hypertension.  Symptoms began several days ago.  Interventions attempted: Prescription medications: Amlodipine , zestoretic .  Symptoms are: unchanged.  Triage Disposition: See PCP When Office is Open (Within 3 Days)  Patient/caregiver understands and will follow disposition?: Yes  Copied from CRM #[id number]. Topic: Clinical - Red Word Triage >> Jan 12, 2024 11:59 AM Benjamin [name] wrote: Red Word that prompted transfer to Nurse Triage: Pt states bp has been 182 even with BP medication, no other symptoms. Reason for Disposition  Systolic BP >= 160 OR Diastolic >= 100  Answer Assessment - Initial Assessment Questions Pt with hx of htn reports onset of BP 182/96 on Monday, and this morning at urologists office 174/76, HR 85. Asymptomatic. Denies CP, SOB, weakness, dizziness, numbness, tingling, headache or changes to speech or vision. BP normally 140/70. Takes amlodipine  and zestoretic  daily, has not missed any doses. Advised pt be seen in next 3 days. Scheduled appt with PCP on 12/5, declines sooner appt with different provider at home office tomorrow. Advised UC or ED for worsening symptoms until then and to monitor BP daily and to call for BP >180/110.   1. BLOOD PRESSURE: What is your blood pressure? Did you take at least two measurements 5 minutes apart?     182/96 on Monday, then 174/76 at urologists office this morning. HR 85  2. ONSET: When did you take your blood pressure?     At urologists office this morning  3. HOW: How did you take your blood pressure? (e.g., automatic home BP monitor, visiting nurse)     At urologists office this morning  4. HISTORY: Do you have a history of high blood pressure?     Htn, denies hx of CVA or MI  5. MEDICINES: Are you taking any medicines for blood  pressure? Have you missed any doses recently?     Amlodipine  and lisinopril  with hydrochlorothiazide  daily  6. OTHER SYMPTOMS: Do you have any symptoms? (e.g., blurred vision, chest pain, difficulty breathing, headache, weakness)     Denies  Protocols used: Blood Pressure - High-A-AH

## 2024-01-14 ENCOUNTER — Ambulatory Visit: Admitting: Family Medicine

## 2024-01-14 ENCOUNTER — Encounter: Payer: Self-pay | Admitting: Family Medicine

## 2024-01-14 VITALS — BP 160/78 | HR 69 | Temp 98.1°F | Ht 71.75 in | Wt 232.0 lb

## 2024-01-14 DIAGNOSIS — I10 Essential (primary) hypertension: Secondary | ICD-10-CM

## 2024-01-14 DIAGNOSIS — F418 Other specified anxiety disorders: Secondary | ICD-10-CM | POA: Diagnosis not present

## 2024-01-14 DIAGNOSIS — G4700 Insomnia, unspecified: Secondary | ICD-10-CM | POA: Diagnosis not present

## 2024-01-14 MED ORDER — ALPRAZOLAM 1 MG PO TABS: 1.0000 mg | ORAL_TABLET | Freq: Four times a day (QID) | ORAL | 5 refills | Status: AC | PRN

## 2024-01-14 MED ORDER — AMLODIPINE BESYLATE 5 MG PO TABS: 10.0000 mg | ORAL_TABLET | Freq: Every day | ORAL | Status: DC

## 2024-01-14 MED ORDER — TEMAZEPAM 30 MG PO CAPS: 30.0000 mg | ORAL_CAPSULE | Freq: Every evening | ORAL | 5 refills | Status: AC | PRN

## 2024-01-14 NOTE — Progress Notes (Signed)
   Subjective:    Patient ID: Benjamin [name], male    DOB: 1940/06/18, 83 y.o.   MRN: [id number]  HPI Here for several issues. First his BP has been going up again. Over the past few weeks his systolic readings have averaged 140's to 170's while the diastolic has been stable. His anxiety has been worsening a bit int he past few months, and he has trouble sleeping. He finds that doubling up on his Xanax  helps his anxiety. He asks to try Temazepam  again, since this worked well for him in the past.    Review of Systems  Constitutional: Negative.   Respiratory: Negative.    Cardiovascular: Negative.   Psychiatric/Behavioral:  Positive for sleep disturbance. Negative for agitation, behavioral problems, confusion, decreased concentration, dysphoric mood and hallucinations. The patient is nervous/anxious.        Objective:   Physical Exam Constitutional:      Appearance: Normal appearance.  Cardiovascular:     Rate and Rhythm: Normal rate and regular rhythm.     Pulses: Normal pulses.     Heart sounds: Normal heart sounds.  Pulmonary:     Effort: Pulmonary effort is normal.     Breath sounds: Normal breath sounds.  Psychiatric:        Mood and Affect: Mood normal.        Behavior: Behavior normal.        Thought Content: Thought content normal.           Assessment & Plan:  For the HTN, we will increase the Amlodipine  to 10 mg daily. For the anxiety, we will increase the Xanax  to 1 mg QID as needed. For the insomnia, we will restart Temazepam  30 mg at bedtime as needed.  Garnette Olmsted, MD

## 2024-01-17 ENCOUNTER — Telehealth: Payer: Self-pay

## 2024-01-17 NOTE — Telephone Encounter (Signed)
 Incoming message left on triage line requesting call back to discuss surgery.   Called pt back, no answer. LM for pt to return call.

## 2024-01-19 ENCOUNTER — Other Ambulatory Visit: Payer: Self-pay | Admitting: Family Medicine

## 2024-01-19 NOTE — Telephone Encounter (Signed)
 Copied from CRM #[id number]. Topic: Clinical - Medication Refill >> Jan 19, 2024 11:23 AM Tanazia G wrote: Medication:  amLODipine  besylate (NORVASC ) 10mg .   Has the patient contacted their pharmacy? Yes (Agent: If no, request that the patient contact the pharmacy for the refill. If patient does not wish to contact the pharmacy document the reason why and proceed with request.) (Agent: If yes, when and what did the pharmacy advise?)  This is the patient's preferred pharmacy:  [facility] [place], [place] - [address] [facility] [address] [place] [address] Phone: [phone] Fax: [phone]  Is this the correct pharmacy for this prescription? Yes If no, delete pharmacy and type the correct one.   Has the prescription been filled recently? Yes  Is the patient out of the medication? Yes  Has the patient been seen for an appointment in the last year OR does the patient have an upcoming appointment? Yes  Can we respond through MyChart? Yes  Agent: Please be advised that Rx refills may take up to 3 business days. We ask that you follow-up with your pharmacy.

## 2024-01-20 MED ORDER — AMLODIPINE BESYLATE 5 MG PO TABS: 10.0000 mg | ORAL_TABLET | Freq: Every day | ORAL | 0 refills | Status: DC

## 2024-01-28 ENCOUNTER — Encounter: Payer: Self-pay | Admitting: Family Medicine

## 2024-01-28 ENCOUNTER — Ambulatory Visit: Payer: Self-pay

## 2024-01-28 ENCOUNTER — Ambulatory Visit (INDEPENDENT_AMBULATORY_CARE_PROVIDER_SITE_OTHER): Admitting: Family Medicine

## 2024-01-28 VITALS — BP 142/78 | HR 71 | Temp 97.9°F | Ht 71.75 in | Wt 227.8 lb

## 2024-01-28 DIAGNOSIS — B9689 Other specified bacterial agents as the cause of diseases classified elsewhere: Secondary | ICD-10-CM

## 2024-01-28 DIAGNOSIS — I10 Essential (primary) hypertension: Secondary | ICD-10-CM | POA: Diagnosis not present

## 2024-01-28 DIAGNOSIS — J329 Chronic sinusitis, unspecified: Secondary | ICD-10-CM | POA: Diagnosis not present

## 2024-01-28 MED ORDER — AMOXICILLIN-POT CLAVULANATE 875-125 MG PO TABS: 1.0000 | ORAL_TABLET | Freq: Two times a day (BID) | ORAL | 0 refills | Status: DC

## 2024-01-28 MED ORDER — BENZONATATE 100 MG PO CAPS: 100.0000 mg | ORAL_CAPSULE | Freq: Three times a day (TID) | ORAL | 0 refills | Status: AC | PRN

## 2024-01-28 NOTE — Progress Notes (Signed)
 " Phone [phone] In person visit   Subjective:   Benjamin [name] is a 83 y.o. 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   Patellofemoral pain syndrome of both knees 04/08/2020   Insomnia 12/30/2016   Depression with anxiety 12/04/2014   Right shoulder pain 12/04/2014   HTN (hypertension) 08/02/2014   Obese 07/18/2014   Diabetes mellitus type 2, controlled, without complications (HCC) 04/14/2011   History of colonic polyps 02/26/2010   Umbilical hernia 08/16/2009   ED (erectile dysfunction) 06/24/2009   IRRITABLE BOWEL SYNDROME 09/03/2008   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 120 tablet 5   amLODipine  (NORVASC ) 5 MG tablet Take 2 tablets (10 mg total) by mouth daily. 90 tablet 0   amoxicillin -clavulanate (AUGMENTIN ) 875-125 MG tablet Take 1 tablet by mouth 2 (two) times daily for 10 days. 20 tablet 0   atorvastatin  (LIPITOR) 20 MG tablet Take 1 tablet (20 mg total) by mouth daily. 90 tablet 3   benzonatate  (TESSALON ) 100 MG capsule Take 1 capsule (100 mg total) by mouth 3 (three) times daily as needed for up to 20 days for  cough. 30 capsule 0   docusate sodium  (COLACE) 50 MG capsule Take 50 mg by mouth daily as needed for mild constipation.     latanoprost  (XALATAN ) 0.005 % ophthalmic solution Place 1 drop into both eyes at bedtime.     lisinopril -hydrochlorothiazide  (ZESTORETIC ) 10-12.5 MG tablet Take 1 tablet by mouth daily. 90 tablet 3   temazepam  (RESTORIL ) 30 MG capsule Take 1 capsule (30 mg total) by mouth at bedtime as needed for sleep. 30 capsule 5   testosterone  cypionate (DEPOTESTOSTERONE CYPIONATE) 200 MG/ML injection Inject 1 mL (200 mg total) into the muscle every 7 (seven) days. 10 mL 2   No current facility-administered medications for this visit.   Facility-Administered Medications Ordered in Other Visits  Medication Dose Route Frequency Provider Last Rate Last Admin   lidocaine  (XYLOCAINE ) 2 % jelly 1 Application  1 Application Topical Once Christine Males, PA-C         Objective:  BP (!) 142/78   Pulse 71   Temp 97.9 F (36.6 C) (Temporal)   Ht 5' 11.75 (1.822 m)   Wt 227 lb 12.8 oz (103.3 kg)   SpO2 97%   BMI 31.11 kg/m  Gen: NAD, resting comfortably  HEENT: Turbinates erythematous with yellow drainage, TM normal, pharynx mildly erythematous with no tonsilar exudate or edema, minimal sinus tenderness CV: RRR no murmurs rubs or gallops Lungs: CTAB no crackles, wheeze, rhonchi Ext: no edema Skin: warm, dry    Assessment and Plan   # Cough/congestion S:  Patient symptoms started 01/11/2024-he is here with productive cough with green mucus starting after surgery on the first for HOLEP .  The cough has been a major irritant as it was pressure on the bladder and prostate where he had laser surgery-this can produce bleeding from his rectum and bladder leakage and it has been very frustrating- has to change his peripheral arterial disease as a result. Some green drainage from sinuses. No fever. No shortness of breath or wheeze. Feels run down but wonders if related to procedure -cough is  keeping him and his male partner awake at night -did take sudafed last night- advised against. Alka seltzer gel pill works best A/P: Sinus infection/Sinusitis. Lungs clear. Over 10 days COVID/flu testing low yield Bacterial based on: Symptoms >10 days Treatment: -symptomatic care can use tessalon  for cough but recommend against any decongestant with his blood pressure issues.  -Antibiotic indicated: yes Augmentin  for 10 days- reports typically needs longer course  Finally, we reviewed reasons to return to care including if symptoms worsen or persist  (despite above treatments) or new concerns arise (particularly fever or shortness of breath)   #hypertension S: medication: amlodipine  10 mg taking but 5 listed, lisinopril  10-12.5 mg daily Home readings #s: trending down to 140's f BP Readings from Last 3 Encounters:  01/28/24 (!) 142/78  01/14/24 (!) 160/78  01/12/24 (!) 174/76  A/P: blood pressure poorly controlled-he has used decongestants pretty regularly lately-I recommended discontinuing and updating us  within a week with home blood pressure readings hopefully as he feels better from the sinus infection this also improves as well.  Continue current medication for now  Recommended follow up: Return for as needed for new, worsening, persistent symptoms. Future Appointments  Date Time Provider Department Center  01/31/2024  9:45 AM Johnny Garnette LABOR, MD [facility] [address]  03/14/2024 12:00 PM Luke Chiquita SAUNDERS, DO [facility] [address]  04/25/2024 10:00 AM [facility] None  05/10/2024 11:15 AM Francisca Redell BROCKS, MD [facility] None    Lab/Order associations:   ICD-10-CM   1. Bacterial sinusitis  J32.9    B96.89     2. Primary hypertension  I10       Meds ordered this encounter  Medications   amoxicillin -clavulanate (AUGMENTIN ) 875-125 MG tablet    Sig: Take 1 tablet by mouth 2 (two) times daily for 10 days.    Dispense:  20 tablet    Refill:  0   benzonatate  (TESSALON ) 100 MG capsule     Sig: Take 1 capsule (100 mg total) by mouth 3 (three) times daily as needed for up to 20 days for cough.    Dispense:  30 capsule    Refill:  0    Return precautions advised.  Garnette Lukes, MD  "

## 2024-01-28 NOTE — Patient Instructions (Addendum)
 Sinus infection/Sinusitis Bacterial based on: Symptoms >10 days Treatment: -symptomatic care can use tessalon  for cough but recommend against any decongestant with his blood pressure issues.  -Antibiotic indicated: yes Augmentin  for 10 days  Finally, we reviewed reasons to return to care including if symptoms worsen or persist  (despite above treatments) or new concerns arise (particularly fever or shortness of breath)  Blood pressure slightly high- Id love for you to update me or Dr. Johnny in a week if feeling better with home readings- if not improving want you to come back within a month for recheck  Recommended follow up: Return for as needed for new, worsening, persistent symptoms.

## 2024-01-28 NOTE — Telephone Encounter (Signed)
 FYI Only or Action Required?: FYI only for provider: appointment scheduled on 01/28/24.  Patient was last seen in primary care on 01/14/2024 by Benjamin [name] LABOR, MD.  Called Nurse Triage reporting Cough.  Symptoms began 1.5 weeks ago.  Interventions attempted: OTC medications: Coricidin; Alka Seltzer cold.  Symptoms are: gradually worsening.  Triage Disposition: See Physician Within 24 Hours  Patient/caregiver understands and will follow disposition?: Yes              Copied from CRM #[id number]. Topic: Clinical - Red Word Triage >> Jan 28, 2024 10:19 AM Benjamin [name] wrote: Red Word that prompted transfer to Nurse Triage: Reason for Triage: Possible bronchititis- Bad cough for about a week and a half. Patient recently had prostate surgery. The coughing is causing irration. Patient wanted to speak to NT. Reason for Disposition  SEVERE coughing spells (e.g., whooping sound after coughing, vomiting after coughing)  Answer Assessment - Initial Assessment Questions Patient calling in asking if Dr Benjamin will send him in antibiotic today, before his scheduled appointment on Monday. Advised PCP may not write for the antibiotic without seeing him and that can not be guaranteed. Advised sooner acute appointment with another provider or going to urgent care. Patient agreeable to acute visit at [facility] today, requesting to keep his appt with PCP on Monday.   1. ONSET: When did the cough begin?      X 1.5 weeks.  2. SEVERITY: How bad is the cough today?      Sever at night, worse at night. Keeps him awake at night.  3. SPUTUM: Describe the color of your sputum (e.g., none, dry cough; clear, white, yellow, green)     Green.  4. HEMOPTYSIS: Are you coughing up any blood? If Yes, ask: How much? (e.g., flecks, streaks, tablespoons, etc.)     No.  5. DIFFICULTY BREATHING: Are you having difficulty breathing? If Yes, ask: How bad is it? (e.g., mild, moderate, severe)       No.  6. FEVER: Do you have a fever? If Yes, ask: What is your temperature, how was it measured, and when did it start?     No.  7. CARDIAC HISTORY: Do you have any history of heart disease? (e.g., heart attack, congestive heart failure)      No.  8. LUNG HISTORY: Do you have any history of lung disease?  (e.g., pulmonary embolus, asthma, emphysema)     No.  9. PE RISK FACTORS: Do you have a history of blood clots? (or: recent major surgery, recent prolonged travel, bedridden)     No.  10. OTHER SYMPTOMS: Do you have any other symptoms? (e.g., runny nose, wheezing, chest pain)       No fever, SOB, chest pain, sneezing, runny nose.  11. PREGNANCY: Is there any chance you are pregnant? When was your last menstrual period?       N/A.  12. TRAVEL: Have you traveled out of the country in the last month? (e.g., travel history, exposures)       No.  Protocols used: Cough - Acute Productive-A-AH

## 2024-01-31 ENCOUNTER — Encounter: Payer: Self-pay | Admitting: Family Medicine

## 2024-01-31 ENCOUNTER — Ambulatory Visit: Admitting: Family Medicine

## 2024-01-31 VITALS — BP 130/70 | HR 64 | Temp 97.8°F | Wt 226.0 lb

## 2024-01-31 DIAGNOSIS — J40 Bronchitis, not specified as acute or chronic: Secondary | ICD-10-CM

## 2024-01-31 DIAGNOSIS — I10 Essential (primary) hypertension: Secondary | ICD-10-CM | POA: Diagnosis not present

## 2024-01-31 MED ORDER — AMOXICILLIN-POT CLAVULANATE 875-125 MG PO TABS: 1.0000 | ORAL_TABLET | Freq: Two times a day (BID) | ORAL | 0 refills | Status: AC

## 2024-01-31 MED ORDER — AMLODIPINE BESYLATE 10 MG PO TABS: 10.0000 mg | ORAL_TABLET | Freq: Every day | ORAL | 3 refills | Status: AC

## 2024-01-31 NOTE — Progress Notes (Signed)
" ° °  Subjective:    Patient ID: Benjamin [name], male    DOB: 1940-08-14, 83 y.o.   MRN: [id number]  HPI Here to follow up on 2 issues. First he was seen by Dr. Katrinka on 01-28-24 for a stuffy head and coughing up yellow sputum. No fever or SOB. 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 Dr. Katrinka gave him 10 days of Augmentin  and Benzonatate , and this has been helpful. The cough is now less frequent and it is dry. He still has some incontinence of urine (as expected) but the bleeding has stopped. The other issue is his HTN. When we saw him on 01-11-24 we increased the dose of Amlodipine  to 10 mg daily for elevated BP readings. Since then the BP has settled down.    Review of Systems  Constitutional: Negative.   HENT: Negative.    Eyes: Negative.   Respiratory:  Positive for cough. Negative for shortness of breath and wheezing.   Cardiovascular: Negative.   Neurological: Negative.        Objective:   Physical Exam Constitutional:      Appearance: Normal appearance.  HENT:     Right Ear: Tympanic membrane, ear canal and external ear normal.     Left Ear: Tympanic membrane, ear canal and external ear normal.     Nose: Nose normal.     Mouth/Throat:     Pharynx: Oropharynx is clear.  Eyes:     Conjunctiva/sclera: Conjunctivae normal.  Cardiovascular:     Rate and Rhythm: Normal rate and regular rhythm.     Pulses: Normal pulses.     Heart sounds: Normal heart sounds.  Pulmonary:     Effort: Pulmonary effort is normal.     Breath sounds: Normal breath sounds.  Musculoskeletal:     Right lower leg: No edema.     Left lower leg: No edema.  Lymphadenopathy:     Cervical: No cervical adenopathy.  Neurological:     Mental Status: He is alert.           Assessment & Plan:  His bronchitis is responding to the Augmentin , so he will  finish up the RX. His HTN is now well controlled.  Garnette Olmsted, MD   "

## 2024-02-05 ENCOUNTER — Emergency Department (HOSPITAL_BASED_OUTPATIENT_CLINIC_OR_DEPARTMENT_OTHER)
Admission: EM | Admit: 2024-02-05 | Discharge: 2024-02-05 | Disposition: A | Discharge status: Home / Self Care | Attending: Emergency Medicine | Admitting: Emergency Medicine

## 2024-02-05 ENCOUNTER — Emergency Department (HOSPITAL_BASED_OUTPATIENT_CLINIC_OR_DEPARTMENT_OTHER): Admitting: Radiology

## 2024-02-05 DIAGNOSIS — J101 Influenza due to other identified influenza virus with other respiratory manifestations: Secondary | ICD-10-CM | POA: Insufficient documentation

## 2024-02-05 DIAGNOSIS — I10 Essential (primary) hypertension: Secondary | ICD-10-CM | POA: Insufficient documentation

## 2024-02-05 DIAGNOSIS — R059 Cough, unspecified: Secondary | ICD-10-CM | POA: Diagnosis present

## 2024-02-05 DIAGNOSIS — Z79899 Other long term (current) drug therapy: Secondary | ICD-10-CM | POA: Insufficient documentation

## 2024-02-05 MED ORDER — GUAIFENESIN-CODEINE 100-10 MG/5ML PO SOLN: 5.0000 mL | Freq: Three times a day (TID) | ORAL | 0 refills | Status: AC | PRN

## 2024-02-05 MED ORDER — OSELTAMIVIR PHOSPHATE 75 MG PO CAPS: 75.0000 mg | ORAL_CAPSULE | Freq: Two times a day (BID) | ORAL | 0 refills | Status: AC

## 2024-02-05 NOTE — ED Notes (Signed)
 HR 66, RR 16, BP 149/73

## 2024-02-05 NOTE — Discharge Instructions (Addendum)
 You were seen for your influenza infection in the emergency department.   At home, please use Tylenol  for your muscle aches and fevers.  Please use over-the-counter cough medication or tea with honey for your cough. Try codeine  for your cough. Take the tamiflu  for your influenza.   Follow-up with your primary doctor in 2-3 days regarding your visit.  This may be over the phone.  Return immediately to the emergency department if you experience any of the following: Difficulty breathing, or any other concerning symptoms.    Thank you for visiting our Emergency Department. It was a pleasure taking care of you today.

## 2024-02-05 NOTE — ED Provider Notes (Signed)
 " Benjamin [name]   CSN: [id number] Arrival date & time: 02/05/24  9152     Patient presents with: Shortness of Breath   Benjamin [name] is a 83 y.o. male.   83 year old male history of hypertension and hyperlipidemia who presents to the emergency department with shortness of breath and productive cough.  December 2 got sick.  Was given Augmentin  by his primary doctor the week of December 9 which she started taking and is about to finish.  Christmas Eve started feeling more weak.  Started developing subjective fevers.  Took a home flu test yesterday that was positive       Prior to Admission medications  Medication Sig Start Date End Date Taking? Authorizing Provider  guaiFENesin -codeine  100-10 MG/5ML syrup Take 5 mLs by mouth 3 (three) times daily as needed for cough. 02/05/24  Yes Benjamin Lamar BROCKS, MD  oseltamivir  (TAMIFLU ) 75 MG capsule Take 1 capsule (75 mg total) by mouth every 12 (twelve) hours. 02/05/24  Yes Benjamin Lamar BROCKS, MD  ALPRAZolam  (XANAX ) 1 MG tablet Take 1 tablet (1 mg total) by mouth every 6 (six) hours as needed for anxiety. 01/14/24   Benjamin Garnette LABOR, MD  amLODipine  (NORVASC ) 10 MG tablet Take 1 tablet (10 mg total) by mouth daily. 01/31/24   Benjamin Garnette LABOR, MD  amoxicillin -clavulanate (AUGMENTIN ) 875-125 MG tablet Take 1 tablet by mouth 2 (two) times daily for 10 days. 01/31/24 02/10/24  Benjamin Garnette LABOR, MD  atorvastatin  (LIPITOR) 20 MG tablet Take 1 tablet (20 mg total) by mouth daily. 07/12/23   Benjamin Garnette LABOR, MD  benzonatate  (TESSALON ) 100 MG capsule Take 1 capsule (100 mg total) by mouth 3 (three) times daily as needed for up to 20 days for cough. 01/28/24 02/17/24  Benjamin Garnette KIDD, MD  docusate sodium  (COLACE) 50 MG capsule Take 50 mg by mouth daily as needed for mild constipation.    [provider]  latanoprost  (XALATAN ) 0.005 % ophthalmic solution Place 1 drop into both eyes at bedtime. 12/08/19    [provider]  lisinopril -hydrochlorothiazide  (ZESTORETIC ) 10-12.5 MG tablet Take 1 tablet by mouth daily. 09/20/23   Benjamin Garnette LABOR, MD  temazepam  (RESTORIL ) 30 MG capsule Take 1 capsule (30 mg total) by mouth at bedtime as needed for sleep. 01/14/24   Benjamin Garnette LABOR, MD  testosterone  cypionate (DEPOTESTOSTERONE CYPIONATE) 200 MG/ML injection Inject 1 mL (200 mg total) into the muscle every 7 (seven) days. 06/15/23   Benjamin Garnette LABOR, MD    Allergies: Norco [hydrocodone -acetaminophen ] and Meloxicam    Review of Systems  Updated Vital Signs BP (!) 151/71 (BP Location: Right Arm)   Pulse 65   Temp 97.9 F (36.6 C) (Oral)   Resp 20   SpO2 96%   Physical Exam Vitals and nursing [name] reviewed.  Constitutional:      General: He is not in acute distress.    Appearance: He is well-developed.  HENT:     Head: Normocephalic and atraumatic.     Right Ear: External ear normal.     Left Ear: External ear normal.     Nose: Nose normal.  Eyes:     Extraocular Movements: Extraocular movements intact.     Conjunctiva/sclera: Conjunctivae normal.     Pupils: Pupils are equal, round, and reactive to light.  Cardiovascular:     Rate and Rhythm: Normal rate and regular rhythm.     Heart sounds: Normal heart sounds.  Pulmonary:  Effort: Pulmonary effort is normal. No respiratory distress.     Breath sounds: Normal breath sounds.  Musculoskeletal:     Cervical back: Normal range of motion and neck supple.     Right lower leg: No edema.     Left lower leg: No edema.  Skin:    General: Skin is warm and dry.  Neurological:     Mental Status: He is alert. Mental status is at baseline.  Psychiatric:        Mood and Affect: Mood normal.        Behavior: Behavior normal.     (all labs ordered are listed, but only abnormal results are displayed) Labs Reviewed - No data to display  EKG: None  Radiology: DG Chest 2 View Result Date: 02/05/2024 CLINICAL DATA:  Shortness of breath.  EXAM: CHEST - 2 VIEW COMPARISON:  11/07/2019. FINDINGS: The heart size and mediastinal contours are within normal limits. Aortic atherosclerosis. No overt pulmonary edema, focal consolidation, pleural effusion, or pneumothorax. Degenerative changes of the thoracic spine. No acute osseous abnormality. IMPRESSION: No acute cardiopulmonary findings. Electronically Signed   By: Harrietta Sherry M.D.   On: 02/05/2024 11:02     Procedures   Medications Ordered in the ED - No data to display                                  Medical Decision Making Amount and/or Complexity of Data Reviewed Radiology: ordered.  Risk OTC drugs. Prescription drug management.   83 year old male history of hypertension and hyperlipidemia presents to the emergency department with cough  Initial Ddx:  Influenza, pneumonia, CHF, COPD/bronchitis  MDM/Course:  Patient presents emergency department with URI type symptoms for several days.  Also had a respiratory illness recently and is finishing up Augmentin .  On exam is not in acute distress.  He is afebrile.  He satting well on room air.  Normal work of breathing.  His lung sounds are clear to auscultation for me without any wheezing or rhonchi.  Did have a chest x-ray that was ordered in triage which does not show any evidence of pneumonia.  With his positive flu test suspect this is likely the cause of his symptoms.  Will initiate him on Tamiflu  due to his age and comorbidities and have him follow-up with his primary doctor in several days.  This patient presents to the ED for concern of complaints listed in HPI, this involves an extensive number of treatment options, and is a complaint that carries with it a high risk of complications and morbidity. Disposition including potential need for admission considered.   Dispo: DC Home. Return precautions discussed including, but not limited to, those listed in the AVS. Allowed pt time to ask questions which were answered  fully prior to dc.  I have reviewed the patients home medications and made adjustments as needed Additional history obtained from spouse Records reviewed Outpatient Clinic Notes I independently reviewed the following imaging with scope of interpretation limited to determining acute life threatening conditions related to emergency care: Chest x-ray and agree with the radiologist interpretation with the following exceptions: none Social Determinants of health:  Geriatric  Portions of this [name] were generated with Scientist, clinical (histocompatibility and immunogenetics). Dictation errors may occur despite best attempts at proofreading.     Final diagnoses:  Influenza A    ED Discharge Orders          Ordered  oseltamivir  (TAMIFLU ) 75 MG capsule  Every 12 hours        02/05/24 1212    guaiFENesin -codeine  100-10 MG/5ML syrup  3 times daily PRN        02/05/24 1212               Benjamin Lamar BROCKS, MD 02/05/24 1849  "

## 2024-02-05 NOTE — ED Triage Notes (Signed)
 SOB worsening. Diag with bronchitis, currently taking antibiotics, positive flu yesterday. Sputum production has changed from green to clear. Clear BBS, course wheeze with cough per RRT.

## 2024-02-21 NOTE — Progress Notes (Signed)
 Benjamin [name]                                          MRN: [id number]   02/21/2024   The [facility] Quality Team Specialist reviewed this patient medical record for the purposes of chart review for care gap closure. The following were reviewed: chart review for care gap closure-kidney health evaluation for diabetes:eGFR  and uACR.    [facility] Quality Team

## 2024-03-13 ENCOUNTER — Other Ambulatory Visit: Payer: Self-pay | Admitting: Family Medicine

## 2024-03-14 ENCOUNTER — Ambulatory Visit: Admitting: Family Medicine

## 2024-04-25 ENCOUNTER — Other Ambulatory Visit

## 2024-05-10 ENCOUNTER — Ambulatory Visit: Admitting: Urology
